# Patient Record
Sex: Male | Born: 1970 | Hispanic: Yes | Marital: Single | Smoking: Never smoker
Health system: Southern US, Community
[De-identification: ages and names within clinical notes are randomized; demographics above are authoritative.]

## PROBLEM LIST (undated history)

## (undated) DIAGNOSIS — E669 Obesity, unspecified: Secondary | ICD-10-CM

## (undated) DIAGNOSIS — K219 Gastro-esophageal reflux disease without esophagitis: Secondary | ICD-10-CM

## (undated) DIAGNOSIS — I10 Essential (primary) hypertension: Secondary | ICD-10-CM

## (undated) DIAGNOSIS — T7840XA Allergy, unspecified, initial encounter: Secondary | ICD-10-CM

## (undated) DIAGNOSIS — F419 Anxiety disorder, unspecified: Secondary | ICD-10-CM

## (undated) DIAGNOSIS — R51 Headache: Secondary | ICD-10-CM

## (undated) DIAGNOSIS — N200 Calculus of kidney: Secondary | ICD-10-CM

## (undated) DIAGNOSIS — Z87442 Personal history of urinary calculi: Secondary | ICD-10-CM

## (undated) DIAGNOSIS — G4730 Sleep apnea, unspecified: Secondary | ICD-10-CM

## (undated) DIAGNOSIS — E119 Type 2 diabetes mellitus without complications: Secondary | ICD-10-CM

## (undated) HISTORY — DX: Obesity, unspecified: E66.9

## (undated) HISTORY — DX: Type 2 diabetes mellitus without complications: E11.9

## (undated) HISTORY — DX: Allergy, unspecified, initial encounter: T78.40XA

## (undated) HISTORY — PX: CHOLECYSTECTOMY: SHX55

## (undated) HISTORY — DX: Sleep apnea, unspecified: G47.30

## (undated) HISTORY — PX: OTHER SURGICAL HISTORY: SHX169

## (undated) HISTORY — DX: Calculus of kidney: N20.0

---

## 1993-09-05 HISTORY — PX: MANDIBLE SURGERY: SHX707

## 2001-10-25 ENCOUNTER — Encounter: Admission: RE | Admit: 2001-10-25 | Discharge: 2001-10-25 | Payer: Self-pay | Admitting: Internal Medicine

## 2001-10-25 ENCOUNTER — Encounter: Payer: Self-pay | Admitting: Internal Medicine

## 2008-09-05 HISTORY — PX: VASECTOMY: SHX75

## 2011-03-18 ENCOUNTER — Other Ambulatory Visit: Payer: Self-pay

## 2011-03-18 ENCOUNTER — Emergency Department (HOSPITAL_BASED_OUTPATIENT_CLINIC_OR_DEPARTMENT_OTHER)
Admission: EM | Admit: 2011-03-18 | Discharge: 2011-03-18 | Disposition: A | Discharge status: Home / Self Care | Payer: BC Managed Care – PPO | Attending: Emergency Medicine | Admitting: Emergency Medicine

## 2011-03-18 ENCOUNTER — Encounter: Payer: Self-pay | Admitting: *Deleted

## 2011-03-18 ENCOUNTER — Emergency Department (INDEPENDENT_AMBULATORY_CARE_PROVIDER_SITE_OTHER): Payer: BC Managed Care – PPO

## 2011-03-18 DIAGNOSIS — I517 Cardiomegaly: Secondary | ICD-10-CM

## 2011-03-18 DIAGNOSIS — R51 Headache: Secondary | ICD-10-CM | POA: Insufficient documentation

## 2011-03-18 DIAGNOSIS — R42 Dizziness and giddiness: Secondary | ICD-10-CM

## 2011-03-18 DIAGNOSIS — R079 Chest pain, unspecified: Secondary | ICD-10-CM | POA: Insufficient documentation

## 2011-03-18 HISTORY — DX: Anxiety disorder, unspecified: F41.9

## 2011-03-18 HISTORY — DX: Headache: R51

## 2011-03-18 LAB — CBC
HCT: 39.3 % (ref 39.0–52.0)
Hemoglobin: 14.1 g/dL (ref 13.0–17.0)
MCHC: 35.9 g/dL (ref 30.0–36.0)
RBC: 5.09 MIL/uL (ref 4.22–5.81)

## 2011-03-18 LAB — BASIC METABOLIC PANEL
BUN: 11 mg/dL (ref 6–23)
CO2: 27 mEq/L (ref 19–32)
Chloride: 101 mEq/L (ref 96–112)
GFR calc non Af Amer: >60 mL/min (ref >60)
Glucose, Bld: 115 mg/dL — ABNORMAL HIGH (ref 70–99)
Potassium: 3.6 mEq/L (ref 3.5–5.1)
Sodium: 138 mEq/L (ref 135–145)

## 2011-03-18 LAB — CARDIAC PANEL(CRET KIN+CKTOT+MB+TROPI)
CK, MB: 2.1 ng/mL (ref 0.3–4.0)
Relative Index: 2.1 (ref 0.0–2.5)
Troponin I: <0.3 ng/mL (ref <0.30)

## 2011-03-18 MED ORDER — IBUPROFEN 400 MG PO TABS
ORAL_TABLET | ORAL | Status: AC
  Administered 2011-03-18: 400 mg via ORAL
  Filled 2011-03-18: qty 1

## 2011-03-18 MED ORDER — IBUPROFEN 400 MG PO TABS
400.0000 mg | ORAL_TABLET | Freq: Once | ORAL | Status: AC
  Administered 2011-03-18: 400 mg via ORAL

## 2011-03-18 NOTE — ED Notes (Signed)
Pt c/o mid sternal CP with dizziness pain radiates to left arm and neck.

## 2011-03-18 NOTE — ED Provider Notes (Signed)
History    chief complaint chest This is a 40 year old male complains of left anterior chest pain onset 2 weeks ago constant improved with an improved with walking worse with sitting supine or no worse with sitting still or lying supine on nonpleuritic no shortness of breath no nausea or sweatiness. Pain is also made worse with abduction of left shoulder improved with putting his left arm at his side symptoms also covered by headache he is treated himself with Excedrin migraine  and with Tylenol with relief. Last dose of Tylenol medially prior to coming here. Cardiac risk factors male gender otherwise negative  Chief Complaint  Patient presents with  . Chest Pain   HPI  Past Medical History  Diagnosis Date  . Anxiety   . Headache     History reviewed. No pertinent past surgical history.  History reviewed. No pertinent family history.  History  Substance Use Topics  . Smoking status: Never Smoker   . Smokeless tobacco: Not on file  . Alcohol Use: No      Review of Systems  Constitutional: Negative.   Respiratory: Negative.   Cardiovascular: Positive for chest pain.  Gastrointestinal: Negative.   Musculoskeletal: Negative.   Skin: Negative.   Neurological: Positive for headaches.  Hematological: Negative.   Psychiatric/Behavioral: Negative.     Physical Exam  BP 126/73  Pulse 80  Temp 98.7 F (37.1 C)  Resp 16  Wt 280 lb (127.007 kg)  SpO2 100%  Physical Exam  Constitutional: He appears well-developed and well-nourished.  HENT:  Head: Normocephalic and atraumatic.  Eyes: Conjunctivae are normal. Pupils are equal, round, and reactive to light.  Neck: Neck supple. No tracheal deviation present. No thyromegaly present.  Cardiovascular: Normal rate and regular rhythm.   No murmur heard. Pulmonary/Chest: Effort normal and breath sounds normal. He exhibits tenderness.       Pain reporduced on forcible abduction odf left shoulder  Abdominal: Soft. Bowel sounds are  normal. He exhibits no distension. There is no tenderness.       obese  Musculoskeletal: Normal range of motion. He exhibits no edema and no tenderness.  Neurological: He is alert. Coordination normal.  Skin: Skin is warm and dry. No rash noted.  Psychiatric: He has a normal mood and affect.    ED Course  Procedures  MDM  Date: 03/18/2011  Rate: 80  Rhythm: normal sinus rhythm  QRS Axis: normal  Intervals: normal  ST/T Wave abnormalities: normal  Conduction Disutrbances:none  Narrative Interpretation: poor r wave progression  Old EKG Reviewed: none available Chest x-ray discussed with radiologist mild cardiomegaly otherwise negative, reviewed by me medical decision medical decision-making: I doubt acute coronary syndrome i.e. highly atypical symptoms(at improved by exertion and worse by rest)., Essentially normal EKG negative cardiac markers 2 days with symptoms plan ibuprofen for pain follow up with primary care doctor at [facility] still symptomatic 1 week  Doug Sou, MD 03/18/11 2324

## 2011-08-01 ENCOUNTER — Other Ambulatory Visit: Payer: Self-pay

## 2011-08-01 ENCOUNTER — Encounter (HOSPITAL_COMMUNITY): Payer: Self-pay

## 2011-08-01 ENCOUNTER — Emergency Department (HOSPITAL_COMMUNITY)
Admission: EM | Admit: 2011-08-01 | Discharge: 2011-08-01 | Discharge status: Against Medical Advice | Payer: BC Managed Care – PPO | Attending: Emergency Medicine | Admitting: Emergency Medicine

## 2011-08-01 DIAGNOSIS — R079 Chest pain, unspecified: Secondary | ICD-10-CM | POA: Insufficient documentation

## 2011-08-01 HISTORY — DX: Essential (primary) hypertension: I10

## 2011-08-01 NOTE — ED Notes (Signed)
VHQ:IONG2<XB> Expected date:08/01/11<BR> Expected time: 3:40 PM<BR> Means of arrival:Ambulance<BR> Comments:<BR> M12 - 40 yoM Shoulder Pain

## 2011-08-01 NOTE — ED Notes (Signed)
CP and squeezing pain......denies SOB

## 2012-06-03 ENCOUNTER — Encounter (HOSPITAL_COMMUNITY): Payer: Self-pay | Admitting: Emergency Medicine

## 2012-06-03 ENCOUNTER — Emergency Department (HOSPITAL_COMMUNITY)
Admission: EM | Admit: 2012-06-03 | Discharge: 2012-06-03 | Disposition: A | Discharge status: Home / Self Care | Payer: BC Managed Care – PPO | Attending: Emergency Medicine | Admitting: Emergency Medicine

## 2012-06-03 ENCOUNTER — Emergency Department (HOSPITAL_COMMUNITY): Payer: BC Managed Care – PPO

## 2012-06-03 DIAGNOSIS — Z881 Allergy status to other antibiotic agents status: Secondary | ICD-10-CM | POA: Insufficient documentation

## 2012-06-03 DIAGNOSIS — F411 Generalized anxiety disorder: Secondary | ICD-10-CM | POA: Insufficient documentation

## 2012-06-03 DIAGNOSIS — R079 Chest pain, unspecified: Secondary | ICD-10-CM | POA: Insufficient documentation

## 2012-06-03 DIAGNOSIS — Z7982 Long term (current) use of aspirin: Secondary | ICD-10-CM | POA: Insufficient documentation

## 2012-06-03 DIAGNOSIS — R1013 Epigastric pain: Secondary | ICD-10-CM | POA: Insufficient documentation

## 2012-06-03 DIAGNOSIS — I10 Essential (primary) hypertension: Secondary | ICD-10-CM | POA: Insufficient documentation

## 2012-06-03 LAB — CBC WITH DIFFERENTIAL/PLATELET
Eosinophils Absolute: 0.2 10*3/uL (ref 0.0–0.7)
Hemoglobin: 13.3 g/dL (ref 13.0–17.0)
Lymphs Abs: 1.9 10*3/uL (ref 0.7–4.0)
MCH: 27.1 pg (ref 26.0–34.0)
Monocytes Relative: 7 % (ref 3–12)
Neutro Abs: 5 10*3/uL (ref 1.7–7.7)
Neutrophils Relative %: 66 % (ref 43–77)
Platelets: 179 10*3/uL (ref 150–400)
RBC: 4.9 MIL/uL (ref 4.22–5.81)
WBC: 7.5 10*3/uL (ref 4.0–10.5)

## 2012-06-03 LAB — COMPREHENSIVE METABOLIC PANEL
ALT: 29 U/L (ref 0–53)
Albumin: 3.6 g/dL (ref 3.5–5.2)
Alkaline Phosphatase: 116 U/L (ref 39–117)
Chloride: 104 mEq/L (ref 96–112)
GFR calc Af Amer: >90 mL/min (ref >90)
Glucose, Bld: 98 mg/dL (ref 70–99)
Potassium: 4 mEq/L (ref 3.5–5.1)
Sodium: 139 mEq/L (ref 135–145)
Total Bilirubin: 0.4 mg/dL (ref 0.3–1.2)
Total Protein: 6.5 g/dL (ref 6.0–8.3)

## 2012-06-03 MED ORDER — GI COCKTAIL ~~LOC~~
30.0000 mL | Freq: Once | ORAL | Status: AC
  Administered 2012-06-03: 30 mL via ORAL
  Filled 2012-06-03: qty 30

## 2012-06-03 MED ORDER — ESOMEPRAZOLE MAGNESIUM 40 MG PO CPDR: 40.0000 mg | DELAYED_RELEASE_CAPSULE | Freq: Every day | ORAL | Status: DC

## 2012-06-03 NOTE — ED Provider Notes (Signed)
History     CSN: [id number]  Arrival date & time 06/03/12  1531   First MD Initiated Contact with Patient 06/03/12 1600      Chief Complaint  Patient presents with  . Chest Pain  . Abdominal Pain    Epigastric Pain    (Consider location/radiation/quality/duration/timing/severity/associated sxs/prior treatment) HPI Comments: Roberto Charles 41 y.o. male   The chief complaint is: Patient presents with:   Chest Pain   Abdominal Pain - Epigastric Pain     Past Medical History:   Anxiety                                                      Headache                                                     Hypertension                                                 Patient presents to ED today with chief complaint of burning epigastric and substernal pain. Patient appears to be mildly neurotic and line of questioning. He does also appear anxious. He states that today. His apartment and no reading he suddenly developed burning epigastric pain which began mildly and then increased it began radiating substernally. He has no history of GERD. No history of smoking, diabetes, hypertension, hypercholesterolemia or family history of early MI or stroke. Patient has single risk factor for cardiac etiology, which is obesity. Patient denies nausea, vomiting, diaphoresis, radiation to left shoulder or jaw. Patient denies exogenous testosterone, recent confinement, history of coagulopathy, history of DVT, leg swelling.  Patient was eating spicy food. Earlier today, but states that this is characteristic for his diet. Patient denies shortness of breath.Denies fevers, chills, myalgias, arthralgias, nausea, vomiting, diarrhea.     Patient is a 41 y.o. male presenting with chest pain and abdominal pain. The history is provided by the patient. No language interpreter was used.  Chest Pain The chest pain began 1 - 2 hours ago. The chest pain is improving. At its most intense, the pain is at 7/10. The pain  is currently at 2/10. The severity of the pain is moderate. The quality of the pain is described as burning. The pain does not radiate. Primary symptoms include abdominal pain. Pertinent negatives for primary symptoms include no fever, no fatigue, no syncope, no shortness of breath, no cough, no wheezing, no palpitations, no nausea, no vomiting, no dizziness and no altered mental status.  The abdominal pain began today. The abdominal pain has been gradually improving since its onset. The abdominal pain is located in the epigastric region. Pain radiation: substernal and along bilateral rib margins. The severity of the abdominal pain is 2/10. The abdominal pain is relieved by nothing.  Pertinent negatives for associated symptoms include no claudication, no diaphoresis, no lower extremity edema, no near-syncope, no numbness, no orthopnea, no paroxysmal nocturnal dyspnea and no weakness. He tried nothing for the symptoms. Risk factors include obesity.  His past medical history  is significant for anxiety/panic attacks.  Pertinent negatives for past medical history include no aneurysm, no aortic aneurysm, no aortic dissection, no arrhythmia, no bicuspid aortic valve, no CAD, no cancer, no congenital heart disease, no connective tissue disease, no COPD, no CHF, no diabetes, no DVT, no hyperhomocysteinemia, no hyperlipidemia, no hypertension, no Kawasaki disease, no Marfan's syndrome, no MI, no mitral valve prolapse, no pacemaker, no PE, no PVD, no recent injury, no rheumatic fever, no seizures, no sickle cell disease, no sleep apnea, no spontaneous pneumothorax, no stimulant use, no strokes, no thyroid problem, no TIA, Turner syndrome and no valve disorder.  Pertinent negatives for family medical history include: family history of aortic dissection, no CAD in family, no connective tissue disease in family, no diabetes in family, no heart disease in family, no hyperlipidemia in family, no hypertension in family, no  Marfan's syndrome in family, no early MI in family, no PE in family, no PVD in family, no sickle cell disease in family, no stroke in family, no sudden death in family and no TIA in family.  Procedure history is positive for exercise treadmill test (previous stress test negative).  Procedure history is negative for cardiac catheterization, echocardiogram, persantine thallium, stress echo and stress thallium.    Abdominal Pain The primary symptoms of the illness include abdominal pain. The primary symptoms of the illness do not include fever, fatigue, shortness of breath, nausea, vomiting, diarrhea or dysuria.  Symptoms associated with the illness do not include diaphoresis, constipation or back pain. Significant associated medical issues do not include diabetes or sickle cell disease.    Past Medical History  Diagnosis Date  . Anxiety   . Headache   . Hypertension     Past Surgical History  Procedure Date  . Vasectomy 2010  . Mandible surgery 1995    History reviewed. No pertinent family history.  History  Substance Use Topics  . Smoking status: Never Smoker   . Smokeless tobacco: Not on file  . Alcohol Use: Yes     4-5 times per month.       Review of Systems  Constitutional: Negative for fever, diaphoresis and fatigue.  Respiratory: Negative for cough, shortness of breath and wheezing.   Cardiovascular: Positive for chest pain. Negative for palpitations, orthopnea, claudication, syncope and near-syncope.  Gastrointestinal: Positive for abdominal pain. Negative for nausea, vomiting, diarrhea and constipation.  Genitourinary: Negative for dysuria.  Musculoskeletal: Negative for myalgias, back pain, arthralgias and gait problem.  Skin: Negative for pallor.  Neurological: Negative for dizziness, seizures, weakness and numbness.  Psychiatric/Behavioral: Negative for altered mental status.    Allergies  Amoxicillin  Home Medications   Current Outpatient Rx  Name Route  Sig Dispense Refill  . ASPIRIN EC 81 MG PO TBEC Oral Take 162 mg by mouth once. For chest pain     . CETIRIZINE HCL 10 MG PO TABS Oral Take 10 mg by mouth daily.      BP 114/61  Pulse 72  Temp 98.1 F (36.7 C) (Oral)  Resp 18  Ht 5\' 6"  (1.676 m)  Wt 260 lb (117.935 kg)  BMI 41.97 kg/m2  SpO2 96%  Physical Exam  Nursing note and vitals reviewed. Constitutional: He is oriented to person, place, and time. He appears well-developed and well-nourished. No distress.  HENT:  Head: Normocephalic and atraumatic.  Eyes: Conjunctivae normal are normal. No scleral icterus.  Neck: Normal range of motion. Neck supple.  Cardiovascular: Normal rate, regular rhythm, normal heart sounds and  intact distal pulses.  Exam reveals no gallop and no friction rub.   No murmur heard. Pulmonary/Chest: Effort normal and breath sounds normal. No respiratory distress. He has no wheezes. He exhibits no tenderness.  Abdominal: Soft. He exhibits no distension. There is no tenderness. There is no guarding.  Musculoskeletal: Normal range of motion. He exhibits no edema.  Neurological: He is alert and oriented to person, place, and time.  Skin: Skin is warm and dry. He is not diaphoretic.  Psychiatric: His behavior is normal.    ED Course  Procedures (including critical care time) Results for orders placed during the hospital encounter of 06/03/12  CBC WITH DIFFERENTIAL      Component Value Range   WBC 7.5  4.0 - 10.5 K/uL   RBC 4.90  4.22 - 5.81 MIL/uL   Hemoglobin 13.3  13.0 - 17.0 g/dL   HCT 45.4 (*) 09.8 - 11.9 %   MCV 79.2  78.0 - 100.0 fL   MCH 27.1  26.0 - 34.0 pg   MCHC 34.3  30.0 - 36.0 g/dL   RDW 14.7  82.9 - 56.2 %   Platelets 179  150 - 400 K/uL   Neutrophils Relative 66  43 - 77 %   Neutro Abs 5.0  1.7 - 7.7 K/uL   Lymphocytes Relative 25  12 - 46 %   Lymphs Abs 1.9  0.7 - 4.0 K/uL   Monocytes Relative 7  3 - 12 %   Monocytes Absolute 0.5  0.1 - 1.0 K/uL   Eosinophils Relative 2  0 - 5 %     Eosinophils Absolute 0.2  0.0 - 0.7 K/uL   Basophils Relative 0  0 - 1 %   Basophils Absolute 0.0  0.0 - 0.1 K/uL  COMPREHENSIVE METABOLIC PANEL      Component Value Range   Sodium 139  135 - 145 mEq/L   Potassium 4.0  3.5 - 5.1 mEq/L   Chloride 104  96 - 112 mEq/L   CO2 27  19 - 32 mEq/L   Glucose, Bld 98  70 - 99 mg/dL   BUN 13  6 - 23 mg/dL   Creatinine, Ser 1.30  0.50 - 1.35 mg/dL   Calcium 8.7  8.4 - 86.5 mg/dL   Total Protein 6.5  6.0 - 8.3 g/dL   Albumin 3.6  3.5 - 5.2 g/dL   AST 22  0 - 37 U/L   ALT 29  0 - 53 U/L   Alkaline Phosphatase 116  39 - 117 U/L   Total Bilirubin 0.4  0.3 - 1.2 mg/dL   GFR calc non Af Amer >90  >90 mL/min   GFR calc Af Amer >90  >90 mL/min  TROPONIN I      Component Value Range   Troponin I <0.30  <0.30 ng/mL  TROPONIN I      Component Value Range   Troponin I <0.30  <0.30 ng/mL   Dg Chest 2 View  06/03/2012  *RADIOLOGY REPORT*  Clinical Data: Mid chest pain.  Abdominal pain.  CHEST - 2 VIEW  Comparison: 03/18/2011  Findings: Midline trachea.  Borderline cardiomegaly, accentuated by a relatively low lung volumes. Numerous leads and wires project over the chest.  No pleural effusion or pneumothorax.  No congestive failure.  Clear lungs.  IMPRESSION: Borderline cardiomegaly and mildly low lung volumes. No acute findings.   Original Report Authenticated By: Consuello Bossier, M.D.     Date: 06/04/2012  Rate:  70  Rhythm: normal sinus rhythm  QRS Axis: normal  Intervals: normal  ST/T Wave abnormalities: normal  Conduction Disutrbances: none  Narrative Interpretation:   Old EKG Reviewed: No significant changes noted     1. Chest pain       MDM  PERC wells negative TIMI 1.6% Previous stress test negative. EKG neg and repeat troponins neg. Labs otherwise unremarkable. I have advised patient of options for further cardiac eval which include CTA of heart but BMI must be under 35.  I have also advised patient that his risk for MI is  extremely low.  Patient sxs resolved after GI cocktail. Discussed return precautions. Discussed reasons to seek immediate care. Patient expresses understanding and agrees with plan.         Arthor Captain, PA-C 06/04/12 1028

## 2012-06-03 NOTE — ED Notes (Signed)
Patient transported to X-ray 

## 2012-06-03 NOTE — ED Notes (Signed)
Pt eating spicy food. Denies Nausea, Vomitting. Pain central chest, burning only.

## 2012-06-03 NOTE — ED Notes (Signed)
MD at bedside. 

## 2012-06-03 NOTE — ED Notes (Signed)
Back from xray uneventful transport pain scale 2/10.

## 2012-06-05 NOTE — ED Provider Notes (Signed)
Medical screening examination/treatment/procedure(s) were performed by non-physician practitioner and as supervising physician I was immediately available for consultation/collaboration.   Gerhard Munch, MD 06/05/12 0006

## 2016-09-14 DIAGNOSIS — E6609 Other obesity due to excess calories: Secondary | ICD-10-CM | POA: Insufficient documentation

## 2016-09-14 DIAGNOSIS — R0683 Snoring: Secondary | ICD-10-CM | POA: Insufficient documentation

## 2016-12-25 DIAGNOSIS — J302 Other seasonal allergic rhinitis: Secondary | ICD-10-CM | POA: Insufficient documentation

## 2016-12-25 DIAGNOSIS — E782 Mixed hyperlipidemia: Secondary | ICD-10-CM | POA: Insufficient documentation

## 2020-04-30 ENCOUNTER — Ambulatory Visit (INDEPENDENT_AMBULATORY_CARE_PROVIDER_SITE_OTHER): Payer: 59 | Admitting: Family Medicine

## 2020-04-30 ENCOUNTER — Encounter: Payer: Self-pay | Admitting: Family Medicine

## 2020-04-30 ENCOUNTER — Other Ambulatory Visit: Payer: Self-pay

## 2020-04-30 VITALS — BP 122/73 | HR 67 | Temp 98.1°F | Ht 66.0 in | Wt 305.1 lb

## 2020-04-30 DIAGNOSIS — Z0000 Encounter for general adult medical examination without abnormal findings: Secondary | ICD-10-CM

## 2020-04-30 DIAGNOSIS — E782 Mixed hyperlipidemia: Secondary | ICD-10-CM | POA: Diagnosis not present

## 2020-04-30 NOTE — Assessment & Plan Note (Signed)
Well adult Orders Placed This Encounter  Procedures  . COMPLETE METABOLIC PANEL WITH GFR  . CBC  . Lipid Profile  . TSH  Screening: Lipid panel Immunizations: UTD Anticipatory guidance/Risk factor reduction:  Recommend healthy, reduced calorie diet and regular exercise for weight management.  He plans to restart martial arts soon.

## 2020-04-30 NOTE — Progress Notes (Signed)
Roberto Charles - 49 y.o. male MRN [id number]  Date of birth: [birth year]  Subjective Chief Complaint  Patient presents with  . Establish Care    HPI Roberto Charles is a 49 y.o. male here today for initial visit.  He has a history of HLD.  He has no concerns today and would like to have annual exam.    He has put on weight over the past few years.  He was active doing martial arts previously but had to stop due to pandemic.  His new job is more sedentary as well.  His diet has not really changed.    He is a non-smoker and has a couple of servings of EtOH each month  He did have COVID vaccine.   Review of Systems  Constitutional: Negative for chills, fever, malaise/fatigue and weight loss.  HENT: Negative for congestion, ear pain and sore throat.   Eyes: Negative for blurred vision, double vision and pain.  Respiratory: Negative for cough and shortness of breath.   Cardiovascular: Negative for chest pain and palpitations.  Gastrointestinal: Negative for abdominal pain, blood in stool, constipation, heartburn and nausea.  Genitourinary: Negative for dysuria and urgency.  Musculoskeletal: Negative for joint pain and myalgias.  Neurological: Negative for dizziness and headaches.  Endo/Heme/Allergies: Does not bruise/bleed easily.  Psychiatric/Behavioral: Negative for depression. The patient is not nervous/anxious and does not have insomnia.     Allergies  Allergen Reactions  . Amoxicillin Other (See Comments)    Causes blood in intestines    Past Medical History:  Diagnosis Date  . Anxiety   . Headache(784.0)   . Hypertension     Past Surgical History:  Procedure Laterality Date  . MANDIBLE SURGERY  1995  . VASECTOMY  2010    Social History   Socioeconomic History  . Marital status: Married    Spouse name: Not on file  . Number of children: Not on file  . Years of education: Not on file  . Highest education level: Not on file  Occupational History  . Occupation:  Insurance underwriter  Tobacco Use  . Smoking status: Never Smoker  . Smokeless tobacco: Never Used  Vaping Use  . Vaping Use: Never used  Substance and Sexual Activity  . Alcohol use: Yes    Alcohol/week: 2.0 standard drinks    Types: 2 Standard drinks or equivalent per week    Comment: 2 X PER MONTH  . Drug use: No  . Sexual activity: Yes    Partners: Female    Birth control/protection: Other-see comments    Comment: Vasectomy  Other Topics Concern  . Not on file  Social History Narrative  . Not on file   Social Determinants of Health   Financial Resource Strain:   . Difficulty of Paying Living Expenses: Not on file  Food Insecurity:   . Worried About Charity fundraiser in the Last Year: Not on file  . Ran Out of Food in the Last Year: Not on file  Transportation Needs:   . Lack of Transportation (Medical): Not on file  . Lack of Transportation (Non-Medical): Not on file  Physical Activity:   . Days of Exercise per Week: Not on file  . Minutes of Exercise per Session: Not on file  Stress:   . Feeling of Stress : Not on file  Social Connections:   . Frequency of Communication with Friends and Family: Not on file  . Frequency of Social Gatherings with Friends and Family: Not on  file  . Attends Religious Services: Not on file  . Active Member of Clubs or Organizations: Not on file  . Attends Archivist Meetings: Not on file  . Marital Status: Not on file    Family History  Problem Relation Age of Onset  . Hypertension Father     Health Maintenance  Topic Date Due  . Hepatitis C Screening  Never done  . COVID-19 Vaccine (1) Never done  . HIV Screening  Never done  . INFLUENZA VACCINE  04/05/2020  . TETANUS/TDAP  01/15/2028     ----------------------------------------------------------------------------------------------------------------------------------------------------------------------------------------------------------------- Physical Exam BP 122/73  (BP Location: Left Arm, Patient Position: Sitting, Cuff Size: Large)   Pulse 67   Temp 98.1 F (36.7 C) (Oral)   Ht 5\' 6"  (1.676 m)   Wt (!) 305 lb 1.9 oz (138.4 kg)   SpO2 96%   BMI 49.25 kg/m   Physical Exam Constitutional:      General: He is not in acute distress. HENT:     Head: Normocephalic and atraumatic.     Right Ear: External ear normal.     Left Ear: External ear normal.  Eyes:     General: No scleral icterus. Neck:     Thyroid: No thyromegaly.  Cardiovascular:     Rate and Rhythm: Normal rate and regular rhythm.     Heart sounds: Normal heart sounds.  Pulmonary:     Effort: Pulmonary effort is normal.     Breath sounds: Normal breath sounds.  Abdominal:     General: Bowel sounds are normal. There is no distension.     Palpations: Abdomen is soft.     Tenderness: There is no abdominal tenderness. There is no guarding.  Musculoskeletal:     Cervical back: Normal range of motion.  Lymphadenopathy:     Cervical: No cervical adenopathy.  Skin:    General: Skin is warm and dry.     Findings: No rash.  Neurological:     Mental Status: He is alert and oriented to person, place, and time.     Cranial Nerves: No cranial nerve deficit.     Motor: No abnormal muscle tone.  Psychiatric:        Behavior: Behavior normal.     ------------------------------------------------------------------------------------------------------------------------------------------------------------------------------------------------------------------- Assessment and Plan  Well adult exam Well adult Orders Placed This Encounter  Procedures  . COMPLETE METABOLIC PANEL WITH GFR  . CBC  . Lipid Profile  . TSH  Screening: Lipid panel Immunizations: UTD Anticipatory guidance/Risk factor reduction:  Recommend healthy, reduced calorie diet and regular exercise for weight management.  He plans to restart martial arts soon.     No orders of the defined types were placed in this  encounter.   No follow-ups on file.    This visit occurred during the SARS-CoV-2 public health emergency.  Safety protocols were in place, including screening questions prior to the visit, additional usage of staff PPE, and extensive cleaning of exam room while observing appropriate contact time as indicated for disinfecting solutions.

## 2020-04-30 NOTE — Patient Instructions (Signed)
Preventive Care 41-49 Years Old, Male Preventive care refers to lifestyle choices and visits with your health care provider that can promote health and wellness. This includes:  A yearly physical exam. This is also called an annual well check.  Regular dental and eye exams.  Immunizations.  Screening for certain conditions.  Healthy lifestyle choices, such as eating a healthy diet, getting regular exercise, not using drugs or products that contain nicotine and tobacco, and limiting alcohol use. What can I expect for my preventive care visit? Physical exam Your health care provider will check:  Height and weight. These may be used to calculate body mass index (BMI), which is a measurement that tells if you are at a healthy weight.  Heart rate and blood pressure.  Your skin for abnormal spots. Counseling Your health care provider may ask you questions about:  Alcohol, tobacco, and drug use.  Emotional well-being.  Home and relationship well-being.  Sexual activity.  Eating habits.  Work and work Statistician. What immunizations do I need?  Influenza (flu) vaccine  This is recommended every year. Tetanus, diphtheria, and pertussis (Tdap) vaccine  You may need a Td booster every 10 years. Varicella (chickenpox) vaccine  You may need this vaccine if you have not already been vaccinated. Zoster (shingles) vaccine  You may need this after age 64. Measles, mumps, and rubella (MMR) vaccine  You may need at least one dose of MMR if you were born in 1957 or later. You may also need a second dose. Pneumococcal conjugate (PCV13) vaccine  You may need this if you have certain conditions and were not previously vaccinated. Pneumococcal polysaccharide (PPSV23) vaccine  You may need one or two doses if you smoke cigarettes or if you have certain conditions. Meningococcal conjugate (MenACWY) vaccine  You may need this if you have certain conditions. Hepatitis A  vaccine  You may need this if you have certain conditions or if you travel or work in places where you may be exposed to hepatitis A. Hepatitis B vaccine  You may need this if you have certain conditions or if you travel or work in places where you may be exposed to hepatitis B. Haemophilus influenzae type b (Hib) vaccine  You may need this if you have certain risk factors. Human papillomavirus (HPV) vaccine  If recommended by your health care provider, you may need three doses over 6 months. You may receive vaccines as individual doses or as more than one vaccine together in one shot (combination vaccines). Talk with your health care provider about the risks and benefits of combination vaccines. What tests do I need? Blood tests  Lipid and cholesterol levels. These may be checked every 5 years, or more frequently if you are over 60 years old.  Hepatitis C test.  Hepatitis B test. Screening  Lung cancer screening. You may have this screening every year starting at age 43 if you have a 30-pack-year history of smoking and currently smoke or have quit within the past 15 years.  Prostate cancer screening. Recommendations will vary depending on your family history and other risks.  Colorectal cancer screening. All adults should have this screening starting at age 72 and continuing until age 2. Your health care provider may recommend screening at age 14 if you are at increased risk. You will have tests every 1-10 years, depending on your results and the type of screening test.  Diabetes screening. This is done by checking your blood sugar (glucose) after you have not eaten  for a while (fasting). You may have this done every 1-3 years.  Sexually transmitted disease (STD) testing. Follow these instructions at home: Eating and drinking  Eat a diet that includes fresh fruits and vegetables, whole grains, lean protein, and low-fat dairy products.  Take vitamin and mineral supplements as  recommended by your health care provider.  Do not drink alcohol if your health care provider tells you not to drink.  If you drink alcohol: ? Limit how much you have to 0-2 drinks a day. ? Be aware of how much alcohol is in your drink. In the [place] one drink equals one 12 oz bottle of beer (355 mL), one 5 oz glass of wine (148 mL), or one 1 oz glass of hard liquor (44 mL). Lifestyle  Take daily care of your teeth and gums.  Stay active. Exercise for at least 30 minutes on 5 or more days each week.  Do not use any products that contain nicotine or tobacco, such as cigarettes, e-cigarettes, and chewing tobacco. If you need help quitting, ask your health care provider.  If you are sexually active, practice safe sex. Use a condom or other form of protection to prevent STIs (sexually transmitted infections).  Talk with your health care provider about taking a low-dose aspirin every day starting at age 53. What's next?  Go to your health care provider once a year for a well check visit.  Ask your health care provider how often you should have your eyes and teeth checked.  Stay up to date on all vaccines. This information is not intended to replace advice given to you by your health care provider. Make sure you discuss any questions you have with your health care provider. Document Revised: 08/16/2018 Document Reviewed: 08/16/2018 Elsevier Patient Education  2020 [employer].

## 2020-05-02 LAB — LIPID PANEL
Cholesterol: 183 mg/dL (ref <200)
HDL: 39 mg/dL — ABNORMAL LOW (ref >=40)
LDL Cholesterol (Calc): 116 mg/dL (calc) — ABNORMAL HIGH
Non-HDL Cholesterol (Calc): 144 mg/dL (calc) — ABNORMAL HIGH (ref <130)
Total CHOL/HDL Ratio: 4.7 (calc) (ref <5.0)
Triglycerides: 162 mg/dL — ABNORMAL HIGH (ref <150)

## 2020-05-02 LAB — CBC
HCT: 43.6 % (ref 38.5–50.0)
Hemoglobin: 14.4 g/dL (ref 13.2–17.1)
MCH: 27.1 pg (ref 27.0–33.0)
MCHC: 33 g/dL (ref 32.0–36.0)
MCV: 82.1 fL (ref 80.0–100.0)
MPV: 10.4 fL (ref 7.5–12.5)
Platelets: 187 10*3/uL (ref 140–400)
RBC: 5.31 10*6/uL (ref 4.20–5.80)
RDW: 14.5 % (ref 11.0–15.0)
WBC: 7.5 10*3/uL (ref 3.8–10.8)

## 2020-05-02 LAB — COMPLETE METABOLIC PANEL WITH GFR
AG Ratio: 1.6 (calc) (ref 1.0–2.5)
ALT: 35 U/L (ref 9–46)
AST: 19 U/L (ref 10–40)
Albumin: 4.1 g/dL (ref 3.6–5.1)
Alkaline phosphatase (APISO): 105 U/L (ref 36–130)
BUN: 11 mg/dL (ref 7–25)
CO2: 25 mmol/L (ref 20–32)
Calcium: 8.4 mg/dL — ABNORMAL LOW (ref 8.6–10.3)
Chloride: 105 mmol/L (ref 98–110)
Creat: 0.71 mg/dL (ref 0.60–1.35)
GFR, Est African American: 129 mL/min/{1.73_m2} (ref >=60)
GFR, Est Non African American: 111 mL/min/{1.73_m2} (ref >=60)
Globulin: 2.5 g/dL (calc) (ref 1.9–3.7)
Glucose, Bld: 115 mg/dL — ABNORMAL HIGH (ref 65–99)
Potassium: 4 mmol/L (ref 3.5–5.3)
Sodium: 137 mmol/L (ref 135–146)
Total Bilirubin: 0.7 mg/dL (ref 0.2–1.2)
Total Protein: 6.6 g/dL (ref 6.1–8.1)

## 2020-05-02 LAB — HEMOGLOBIN A1C W/OUT EAG: Hgb A1c MFr Bld: 6.6 % of total Hgb — ABNORMAL HIGH (ref <5.7)

## 2020-05-02 LAB — TSH: TSH: 1.89 mIU/L (ref 0.40–4.50)

## 2020-05-06 ENCOUNTER — Other Ambulatory Visit: Payer: Self-pay

## 2020-05-06 MED ORDER — METFORMIN HCL 500 MG PO TABS: 500.0000 mg | ORAL_TABLET | Freq: Every day | ORAL | 0 refills | Status: DC

## 2020-05-14 ENCOUNTER — Encounter: Payer: Self-pay | Admitting: Family Medicine

## 2020-06-04 ENCOUNTER — Emergency Department (HOSPITAL_BASED_OUTPATIENT_CLINIC_OR_DEPARTMENT_OTHER): Payer: 59

## 2020-06-04 ENCOUNTER — Other Ambulatory Visit: Payer: Self-pay

## 2020-06-04 ENCOUNTER — Emergency Department (HOSPITAL_BASED_OUTPATIENT_CLINIC_OR_DEPARTMENT_OTHER)
Admission: EM | Admit: 2020-06-04 | Discharge: 2020-06-04 | Disposition: A | Discharge status: Home / Self Care | Payer: 59 | Attending: Emergency Medicine | Admitting: Emergency Medicine

## 2020-06-04 ENCOUNTER — Encounter (HOSPITAL_BASED_OUTPATIENT_CLINIC_OR_DEPARTMENT_OTHER): Payer: Self-pay | Admitting: Emergency Medicine

## 2020-06-04 DIAGNOSIS — M542 Cervicalgia: Secondary | ICD-10-CM | POA: Diagnosis not present

## 2020-06-04 DIAGNOSIS — R0789 Other chest pain: Secondary | ICD-10-CM | POA: Insufficient documentation

## 2020-06-04 DIAGNOSIS — R519 Headache, unspecified: Secondary | ICD-10-CM | POA: Diagnosis present

## 2020-06-04 DIAGNOSIS — I10 Essential (primary) hypertension: Secondary | ICD-10-CM | POA: Insufficient documentation

## 2020-06-04 DIAGNOSIS — E119 Type 2 diabetes mellitus without complications: Secondary | ICD-10-CM | POA: Diagnosis not present

## 2020-06-04 DIAGNOSIS — M25512 Pain in left shoulder: Secondary | ICD-10-CM | POA: Insufficient documentation

## 2020-06-04 LAB — CBC
HCT: 44.1 % (ref 39.0–52.0)
Hemoglobin: 14.4 g/dL (ref 13.0–17.0)
MCH: 26.8 pg (ref 26.0–34.0)
MCHC: 32.7 g/dL (ref 30.0–36.0)
MCV: 82.1 fL (ref 80.0–100.0)
Platelets: 189 10*3/uL (ref 150–400)
RBC: 5.37 MIL/uL (ref 4.22–5.81)
RDW: 14.5 % (ref 11.5–15.5)
WBC: 8 10*3/uL (ref 4.0–10.5)
nRBC: 0 % (ref 0.0–0.2)

## 2020-06-04 LAB — BASIC METABOLIC PANEL
Anion gap: 10 (ref 5–15)
BUN: 14 mg/dL (ref 6–20)
CO2: 25 mmol/L (ref 22–32)
Calcium: 8.6 mg/dL — ABNORMAL LOW (ref 8.9–10.3)
Chloride: 103 mmol/L (ref 98–111)
Creatinine, Ser: 0.69 mg/dL (ref 0.61–1.24)
GFR calc Af Amer: >60 mL/min (ref >60)
GFR calc non Af Amer: >60 mL/min (ref >60)
Glucose, Bld: 117 mg/dL — ABNORMAL HIGH (ref 70–99)
Potassium: 4 mmol/L (ref 3.5–5.1)
Sodium: 138 mmol/L (ref 135–145)

## 2020-06-04 LAB — TROPONIN I (HIGH SENSITIVITY)
Troponin I (High Sensitivity): 3 ng/L (ref <18)
Troponin I (High Sensitivity): 3 ng/L (ref <18)

## 2020-06-04 LAB — PROTIME-INR
INR: 1.1 (ref 0.8–1.2)
Prothrombin Time: 13.3 seconds (ref 11.4–15.2)

## 2020-06-04 LAB — APTT: aPTT: 34 seconds (ref 24–36)

## 2020-06-04 MED ORDER — DIPHENHYDRAMINE HCL 50 MG/ML IJ SOLN
25.0000 mg | Freq: Once | INTRAMUSCULAR | Status: AC
  Administered 2020-06-04: 25 mg via INTRAVENOUS
  Filled 2020-06-04: qty 1

## 2020-06-04 MED ORDER — PROCHLORPERAZINE EDISYLATE 10 MG/2ML IJ SOLN
10.0000 mg | Freq: Once | INTRAMUSCULAR | Status: AC
  Administered 2020-06-04: 10 mg via INTRAVENOUS
  Filled 2020-06-04: qty 2

## 2020-06-04 MED ORDER — IOHEXOL 350 MG/ML SOLN
100.0000 mL | Freq: Once | INTRAVENOUS | Status: AC | PRN
  Administered 2020-06-04: 100 mL via INTRAVENOUS

## 2020-06-04 MED ORDER — SODIUM CHLORIDE 0.9 % IV BOLUS
1000.0000 mL | Freq: Once | INTRAVENOUS | Status: AC
  Administered 2020-06-04: 1000 mL via INTRAVENOUS

## 2020-06-04 NOTE — Discharge Instructions (Addendum)
You were evaluated in the Emergency Department and after careful evaluation, we did not find any emergent condition requiring admission or further testing in the hospital.  Your exam/testing today was overall reassuring.  No evidence of stroke or abnormalities of the brain.  Given your symptoms, we recommend follow-up with a neurologist.  Please return to the Emergency Department if you experience any worsening of your condition.  Thank you for allowing [place] to be a part of your care.

## 2020-06-04 NOTE — ED Provider Notes (Signed)
[facility] Emergency Department Provider Note MRN:  [id number]  Arrival date & time: 06/04/20     Chief Complaint   Headache   History of Present Illness   Roberto Charles is a 49 y.o. year-old male with a history of hypertension, diabetes presenting to the ED with chief complaint of headache.  Sudden onset swooshing sound in both ears but more so on the right followed by moderate to severe headache, left-sided neck and shoulder pain.  Also endorsing some chest discomfort.  Denies shortness of breath, no fever, no cough, no abdominal pain, no numbness or weakness to the arms or legs but for a few moments felt like he was frozen and unable to move his arms.  Review of Systems  A complete 10 system review of systems was obtained and all systems are negative except as noted in the HPI and PMH.   Patient's Health History    Past Medical History:  Diagnosis Date  . Anxiety   . Diabetes mellitus without complication ([place])   . Headache(784.0)   . Hypertension     Past Surgical History:  Procedure Laterality Date  . CHOLECYSTECTOMY    . MANDIBLE SURGERY  1995  . VASECTOMY  2010    Family History  Problem Relation Age of Onset  . Hypertension Father     Social History   Socioeconomic History  . Marital status: Married    Spouse name: Not on file  . Number of children: Not on file  . Years of education: Not on file  . Highest education level: Not on file  Occupational History  . Occupation: Insurance underwriter  Tobacco Use  . Smoking status: Never Smoker  . Smokeless tobacco: Never Used  Vaping Use  . Vaping Use: Never used  Substance and Sexual Activity  . Alcohol use: Yes    Alcohol/week: 2.0 standard drinks    Types: 2 Standard drinks or equivalent per week    Comment: 2 X PER MONTH  . Drug use: No  . Sexual activity: Yes    Partners: Female    Birth control/protection: Other-see comments    Comment: Vasectomy  Other Topics Concern  . Not on  file  Social History Narrative  . Not on file   Social Determinants of Health   Financial Resource Strain:   . Difficulty of Paying Living Expenses: Not on file  Food Insecurity:   . Worried About Charity fundraiser in the Last Year: Not on file  . Ran Out of Food in the Last Year: Not on file  Transportation Needs:   . Lack of Transportation (Medical): Not on file  . Lack of Transportation (Non-Medical): Not on file  Physical Activity:   . Days of Exercise per Week: Not on file  . Minutes of Exercise per Session: Not on file  Stress:   . Feeling of Stress : Not on file  Social Connections:   . Frequency of Communication with Friends and Family: Not on file  . Frequency of Social Gatherings with Friends and Family: Not on file  . Attends Religious Services: Not on file  . Active Member of Clubs or Organizations: Not on file  . Attends Archivist Meetings: Not on file  . Marital Status: Not on file  Intimate Partner Violence:   . Fear of Current or Ex-Partner: Not on file  . Emotionally Abused: Not on file  . Physically Abused: Not on file  . Sexually Abused: Not on  file     Physical Exam   Vitals:   06/04/20 0744 06/04/20 1014  BP: 108/71 (!) 109/58  Pulse: 62 (!) 56  Resp: 18 18  Temp: 98.1 F (36.7 C)   SpO2: 97% 96%    CONSTITUTIONAL: Well-appearing, NAD NEURO:  Alert and oriented x 3, normal and symmetric strength and sensation, normal coordination, normal speech, patient endorsing hypersensitivity to the left side of the face EYES:  eyes equal and reactive ENT/NECK:  no LAD, no JVD CARDIO: Regular rate, well-perfused, normal S1 and S2 PULM:  CTAB no wheezing or rhonchi GI/GU:  normal bowel sounds, non-distended, non-tender MSK/SPINE:  No gross deformities, no edema SKIN:  no rash, atraumatic PSYCH:  Appropriate speech and behavior  *Additional and/or pertinent findings included in MDM below  Diagnostic and Interventional Summary    EKG  Interpretation  Date/Time:  Thursday June 04 2020 08:22:18 EDT Ventricular Rate:  61 PR Interval:    QRS Duration: 98 QT Interval:  388 QTC Calculation: 391 R Axis:   52 Text Interpretation: Sinus rhythm Low voltage, precordial leads Confirmed by Gerlene Fee [phone]) on 06/04/2020 8:52:18 AM      Labs Reviewed  BASIC METABOLIC PANEL - Abnormal; Notable for the following components:      Result Value   Glucose, Bld 117 (*)    Calcium 8.6 (*)    All other components within normal limits  CBC  PROTIME-INR  APTT  TROPONIN I (HIGH SENSITIVITY)  TROPONIN I (HIGH SENSITIVITY)    CT Angio Neck W and/or Wo Contrast  Final Result    CT Angio Head W or Wo Contrast  Final Result    DG Chest Port 1 View  Final Result      Medications  sodium chloride 0.9 % bolus 1,000 mL (0 mLs Intravenous Stopped 06/04/20 0958)  diphenhydrAMINE (BENADRYL) injection 25 mg (25 mg Intravenous Given 06/04/20 0859)  prochlorperazine (COMPAZINE) injection 10 mg (10 mg Intravenous Given 06/04/20 0901)  iohexol (OMNIPAQUE) 350 MG/ML injection 100 mL (100 mLs Intravenous Contrast Given 06/04/20 0916)     Procedures  /  Critical Care Procedures  ED Course and Medical Decision Making  I have reviewed the triage vital signs, the nursing notes, and pertinent available records from the EMR.  Listed above are laboratory and imaging tests that I personally ordered, reviewed, and interpreted and then considered in my medical decision making (see below for details).  Considering vascular pathology given the pulsatile swishing sound in the ears, followed by sudden onset headache will need CTA to exclude subarachnoid hemorrhage.  Also considering complex migraine given the hypersensitivity to the left side of the face, patient's NIH stroke scale is currently 0, awaiting CTA.  Will also obtain EKG and troponin and chest x-ray given the chest pain the patient is endorsing.     Work-up is reassuring, CTA without  signs of aneurysm or bleeding, no evidence of stroke, labs are reassuring with negative troponin x2.  Patient feeling much better after migraine cocktail.  Continued normal neurological exam.  Upon closer questioning after the headache patient explains that felt spasms in bilateral arms but denied anything that would suggest TIA, no lateral numbness or weakness, no speech impairment, no visual changes.  Favoring complex migraine, no indication for admission or further testing, appropriate for discharge.  Barth Kirks. [place] Small, [place] [email or web address]  Final Clinical Impressions(s) / ED Diagnoses     ICD-10-CM   1. Acute nonintractable  headache, unspecified headache type  R51.9     ED Discharge Orders    None       Discharge Instructions Discussed with and Provided to Patient:     Discharge Instructions     You were evaluated in the Emergency Department and after careful evaluation, we did not find any emergent condition requiring admission or further testing in the hospital.  Your exam/testing today was overall reassuring.  No evidence of stroke or abnormalities of the brain.  Given your symptoms, we recommend follow-up with a neurologist.  Please return to the Emergency Department if you experience any worsening of your condition.  Thank you for allowing [place] to be a part of your care.        Maudie Flakes, MD 06/04/20 [phone]

## 2020-06-04 NOTE — ED Triage Notes (Signed)
Pt states that at 6:50 he experienced a "pulsating swhooshing sound" and felt a headache, L shoulder pain and couldn't move. He is unsure how long it lasted. Still c/o shoulder and head pain.

## 2020-06-04 NOTE — ED Notes (Signed)
ED Provider at bedside. 

## 2020-06-09 ENCOUNTER — Encounter: Payer: Self-pay | Admitting: Family Medicine

## 2020-06-09 ENCOUNTER — Ambulatory Visit (INDEPENDENT_AMBULATORY_CARE_PROVIDER_SITE_OTHER): Payer: 59 | Admitting: Family Medicine

## 2020-06-09 VITALS — BP 132/70 | HR 61 | Temp 98.2°F | Wt 304.0 lb

## 2020-06-09 DIAGNOSIS — R310 Gross hematuria: Secondary | ICD-10-CM | POA: Diagnosis not present

## 2020-06-09 DIAGNOSIS — R319 Hematuria, unspecified: Secondary | ICD-10-CM | POA: Insufficient documentation

## 2020-06-09 LAB — POCT URINALYSIS DIP (CLINITEK)
Bilirubin, UA: NEGATIVE
Blood, UA: NEGATIVE
Glucose, UA: NEGATIVE mg/dL
Ketones, POC UA: NEGATIVE mg/dL
Nitrite, UA: NEGATIVE
POC PROTEIN,UA: NEGATIVE
Spec Grav, UA: 1.025 (ref 1.010–1.025)
Urobilinogen, UA: 0.2 E.U./dL
pH, UA: 6 (ref 5.0–8.0)

## 2020-06-09 MED ORDER — PHENAZOPYRIDINE HCL 200 MG PO TABS: 200.0000 mg | ORAL_TABLET | Freq: Three times a day (TID) | ORAL | 0 refills | Status: DC | PRN

## 2020-06-09 NOTE — Progress Notes (Signed)
Kent Riendeau - 49 y.o. male MRN [id number]  Date of birth: Sep 04, 1971  Subjective Chief Complaint  Patient presents with  . Hematuria    HPI Asahd Can is a 49 y.o. male here today with complaint of hematuria.  Reports that he was using the restroom yesterday and flow decreased.  He had to force urine out but when he did he noticed blood in his urine.  Blood in his urine has nearly resolved however he has had some mild burning when urinating. He denies flank pain, fever, chills, nausea.  ROS:  A comprehensive ROS was completed and negative except as noted per HPI  Allergies  Allergen Reactions  . Amoxicillin Other (See Comments)    Causes blood in intestines    Past Medical History:  Diagnosis Date  . Anxiety   . Diabetes mellitus without complication ([place])   . Headache(784.0)   . Hypertension     Past Surgical History:  Procedure Laterality Date  . CHOLECYSTECTOMY    . MANDIBLE SURGERY  1995  . VASECTOMY  2010    Social History   Socioeconomic History  . Marital status: Married    Spouse name: Not on file  . Number of children: Not on file  . Years of education: Not on file  . Highest education level: Not on file  Occupational History  . Occupation: Insurance underwriter  Tobacco Use  . Smoking status: Never Smoker  . Smokeless tobacco: Never Used  Vaping Use  . Vaping Use: Never used  Substance and Sexual Activity  . Alcohol use: Yes    Alcohol/week: 2.0 standard drinks    Types: 2 Standard drinks or equivalent per week    Comment: 2 X PER MONTH  . Drug use: No  . Sexual activity: Yes    Partners: Female    Birth control/protection: Other-see comments    Comment: Vasectomy  Other Topics Concern  . Not on file  Social History Narrative  . Not on file   Social Determinants of Health   Financial Resource Strain:   . Difficulty of Paying Living Expenses: Not on file  Food Insecurity:   . Worried About Charity fundraiser in the Last Year: Not on file  .  Ran Out of Food in the Last Year: Not on file  Transportation Needs:   . Lack of Transportation (Medical): Not on file  . Lack of Transportation (Non-Medical): Not on file  Physical Activity:   . Days of Exercise per Week: Not on file  . Minutes of Exercise per Session: Not on file  Stress:   . Feeling of Stress : Not on file  Social Connections:   . Frequency of Communication with Friends and Family: Not on file  . Frequency of Social Gatherings with Friends and Family: Not on file  . Attends Religious Services: Not on file  . Active Member of Clubs or Organizations: Not on file  . Attends Archivist Meetings: Not on file  . Marital Status: Not on file    Family History  Problem Relation Age of Onset  . Hypertension Father     Health Maintenance  Topic Date Due  . Hepatitis C Screening  Never done  . INFLUENZA VACCINE  04/05/2020  . TETANUS/TDAP  01/15/2028  . COVID-19 Vaccine  Completed  . HIV Screening  Completed     ----------------------------------------------------------------------------------------------------------------------------------------------------------------------------------------------------------------- Physical Exam BP 132/70 (BP Location: Left Arm, Patient Position: Sitting, Cuff Size: Large)   Pulse 61   Temp  98.2 F (36.8 C) (Oral)   Wt (!) 304 lb (137.9 kg)   SpO2 94%   BMI 49.07 kg/m   Physical Exam Constitutional:      Appearance: Normal appearance.  HENT:     Head: Normocephalic and atraumatic.  Eyes:     General: No scleral icterus. Cardiovascular:     Rate and Rhythm: Normal rate and regular rhythm.  Pulmonary:     Effort: Pulmonary effort is normal.     Breath sounds: Normal breath sounds.  Abdominal:     Tenderness: There is no right CVA tenderness or left CVA tenderness.  Musculoskeletal:     Cervical back: Neck supple.  Neurological:     General: No focal deficit present.     Mental Status: He is alert.   Psychiatric:        Mood and Affect: Mood normal.        Behavior: Behavior normal.     ------------------------------------------------------------------------------------------------------------------------------------------------------------------------------------------------------------------- Assessment and Plan  Hematuria This has nearly resolved at this point. Repeat UA from today without blood. He likely passed small stone.   Will send for culture as he is still having dysuria.  Adding pyridium as needed for comfort.     Meds ordered this encounter  Medications  . phenazopyridine (PYRIDIUM) 200 MG tablet    Sig: Take 1 tablet (200 mg total) by mouth 3 (three) times daily as needed for pain.    Dispense:  10 tablet    Refill:  0   Orders Placed This Encounter  Procedures  . Urine Culture  . POCT URINALYSIS DIP (CLINITEK)    No follow-ups on file.    This visit occurred during the SARS-CoV-2 public health emergency.  Safety protocols were in place, including screening questions prior to the visit, additional usage of staff PPE, and extensive cleaning of exam room while observing appropriate contact time as indicated for disinfecting solutions.

## 2020-06-09 NOTE — Assessment & Plan Note (Signed)
This has nearly resolved at this point. Repeat UA from today without blood. He likely passed small stone.   Will send for culture as he is still having dysuria.  Adding pyridium as needed for comfort.

## 2020-06-09 NOTE — Patient Instructions (Signed)
Increase your fluid intake.  Try pyridium as needed.    Diet Recommendations for Kidney Stones  General Recommendations Drink plenty of fluid: 2-3 quarts/day This includes any type of fluid such as water, coffee and lemonade which have been shown to have a beneficial effect with the exception of grapefruit juice and soda. This will help produce less concentrated urine and ensure a good urine volume of at least 2.5L/day  Limit foods with high oxalate content Spinach, many berries, chocolate, wheat bran, nuts, beets, tea and rhubarb should be eliminated from your diet intake  Eat enough dietary calcium Three servings of dairy per day will help lower the risk of calcium stone formation. Eat with meals. Avoid extra calcium supplements Calcium supplements should be individualized by your physician and registered kidney dietitian  Eat a moderate amount of protein High protein intakes will cause the kidneys to excrete more calcium therefore this may cause more stones to form in the kidney  Avoid high salt intake High sodium intake increases calcium in the urine which increases the chances of developing stones Low salt diet is also important to control blood pressure.  Avoid high doses of vitamin C supplements It is recommend to take 60mg /day of vitamin C based on the [place] Dietary Reference Intake Excess amounts of 1000mg /day or more may produce more oxalate in the body

## 2020-06-11 ENCOUNTER — Encounter: Payer: Self-pay | Admitting: Family Medicine

## 2020-06-11 LAB — URINE CULTURE
MICRO NUMBER:: 11033953
Result:: NO GROWTH
SPECIMEN QUALITY:: ADEQUATE

## 2020-08-05 ENCOUNTER — Encounter: Payer: Self-pay | Admitting: Family Medicine

## 2020-08-06 ENCOUNTER — Other Ambulatory Visit: Payer: Self-pay

## 2020-08-06 MED ORDER — METFORMIN HCL 500 MG PO TABS
500.0000 mg | ORAL_TABLET | Freq: Every day | ORAL | 0 refills | Status: DC
Start: 2020-08-06 — End: 2020-10-02

## 2020-09-05 ENCOUNTER — Other Ambulatory Visit: Payer: Self-pay

## 2020-09-05 ENCOUNTER — Emergency Department (INDEPENDENT_AMBULATORY_CARE_PROVIDER_SITE_OTHER)
Admission: EM | Admit: 2020-09-05 | Discharge: 2020-09-05 | Disposition: A | Discharge status: Home / Self Care | Payer: BC Managed Care – PPO | Source: Home / Self Care | Attending: Family Medicine | Admitting: Family Medicine

## 2020-09-05 ENCOUNTER — Encounter: Payer: Self-pay | Admitting: Emergency Medicine

## 2020-09-05 DIAGNOSIS — U071 COVID-19: Secondary | ICD-10-CM

## 2020-09-05 LAB — POC SARS CORONAVIRUS 2 AG -  ED: SARS Coronavirus 2 Ag: POSITIVE — AB

## 2020-09-05 NOTE — ED Provider Notes (Signed)
Roberto Charles CARE    CSN: [id number] Arrival date & time: 09/05/20  [id number]      History   Chief Complaint Chief Complaint  Patient presents with  . URI    HPI Roberto Charles is a 50 y.o. male.   Patient complains of cough sore throat fever myalgias headache.  Has been vaccinated for Covid and flu.  Has had some secondary Covid exposure over the last week.  Cough is nonproductive.  HPI  Past Medical History:  Diagnosis Date  . Anxiety   . Diabetes mellitus without complication ([place])   . Headache(784.0)   . Hypertension     Patient Active Problem List   Diagnosis Date Noted  . Hematuria 06/09/2020  . Well adult exam 04/30/2020  . Mixed hyperlipidemia 12/25/2016  . Perennial allergic rhinitis with seasonal variation 12/25/2016  . Obesity due to excess calories without serious comorbidity 09/14/2016  . Snores 09/14/2016    Past Surgical History:  Procedure Laterality Date  . CHOLECYSTECTOMY    . MANDIBLE SURGERY  1995  . VASECTOMY  2010       Home Medications    Prior to Admission medications   Medication Sig Start Date End Date Taking? Authorizing Provider  cetirizine (ZYRTEC) 10 MG tablet Take 10 mg by mouth daily.   Yes [provider]  metFORMIN (GLUCOPHAGE) 500 MG tablet Take 1 tablet (500 mg total) by mouth daily with breakfast. 08/06/20 11/04/20 Yes Zigmund Daniel, [place], DO  mometasone (NASONEX) 50 MCG/ACT nasal spray Place into the nose.   Yes [provider]  phenazopyridine (PYRIDIUM) 200 MG tablet Take 1 tablet (200 mg total) by mouth 3 (three) times daily as needed for pain. 06/09/20   Luetta Nutting, DO    Family History Family History  Problem Relation Age of Onset  . Hypertension Father     Social History Social History   Tobacco Use  . Smoking status: Never Smoker  . Smokeless tobacco: Never Used  Vaping Use  . Vaping Use: Never used  Substance Use Topics  . Alcohol use: Yes    Alcohol/week: 2.0 standard drinks     Types: 2 Standard drinks or equivalent per week    Comment: 2 X PER MONTH  . Drug use: No     Allergies   Amoxicillin   Review of Systems Review of Systems  Constitutional: Positive for fever.  HENT: Positive for congestion and sore throat.   Respiratory: Positive for cough.   Musculoskeletal: Positive for myalgias.  All other systems reviewed and are negative.    Physical Exam Triage Vital Signs ED Triage Vitals  Enc Vitals Group     BP 09/05/20 0836 111/71     Pulse Rate 09/05/20 0836 81     Resp --      Temp 09/05/20 0836 99.2 F (37.3 C)     Temp Source 09/05/20 0836 Oral     SpO2 09/05/20 0836 94 %     Weight 09/05/20 0837 (!) 305 lb (138.3 kg)     Height 09/05/20 0837 5\' 6"  (1.676 m)     Head Circumference --      Peak Flow --      Pain Score 09/05/20 0836 5     Pain Loc --      Pain Edu? --      Excl. in [place]? --    No data found.  Updated Vital Signs BP 111/71 (BP Location: Right Arm)   Pulse 81   Temp  99.2 F (37.3 C) (Oral)   Ht 5\' 6"  (1.676 m)   Wt (!) 138.3 kg   SpO2 94%   BMI 49.23 kg/m   Visual Acuity Right Eye Distance:   Left Eye Distance:   Bilateral Distance:    Right Eye Near:   Left Eye Near:    Bilateral Near:     Physical Exam Vitals and nursing note reviewed.  Constitutional:      Appearance: Normal appearance. He is obese.  HENT:     Right Ear: Tympanic membrane normal.     Left Ear: Tympanic membrane normal.     Nose: Nose normal.     Mouth/Throat:     Mouth: Mucous membranes are moist.     Pharynx: Posterior oropharyngeal erythema present.  Cardiovascular:     Rate and Rhythm: Normal rate and regular rhythm.  Pulmonary:     Effort: Pulmonary effort is normal.     Breath sounds: Normal breath sounds.  Neurological:     General: No focal deficit present.     Mental Status: He is alert and oriented to person, place, and time.      UC Treatments / Results  Labs (all labs ordered are listed, but only abnormal  results are displayed) Labs Reviewed - No data to display  EKG   Radiology No results found.  Procedures Procedures (including critical care time)  Medications Ordered in UC Medications - No data to display  Initial Impression / Assessment and Plan / UC Course  I have reviewed the triage vital signs and the nursing notes.  Pertinent labs & imaging results that were available during my care of the patient were reviewed by me and considered in my medical decision making (see chart for details).     Covid Final Clinical Impressions(s) / UC Diagnoses   Final diagnoses:  None   Discharge Instructions   None    ED Prescriptions    None     PDMP not reviewed this encounter.   , MD 09/05/20 [phone]

## 2020-09-05 NOTE — ED Triage Notes (Signed)
Patient c/o fever, body aches since Thursday, sore throat, cough, congestion, SOB.  Patient has been taking NyQuil and DayQuil.  Patient is vaccinated.

## 2020-09-10 ENCOUNTER — Encounter: Payer: Self-pay | Admitting: Family Medicine

## 2020-10-02 MED ORDER — METFORMIN HCL 500 MG PO TABS
500.0000 mg | ORAL_TABLET | Freq: Every day | ORAL | 1 refills | Status: DC
Start: 2020-10-02 — End: 2021-03-23

## 2020-10-02 NOTE — Addendum Note (Signed)
Addended by: Narda Rutherford on: 10/02/2020 10:45 AM   Modules accepted: Orders

## 2020-11-30 DIAGNOSIS — I639 Cerebral infarction, unspecified: Secondary | ICD-10-CM | POA: Diagnosis not present

## 2020-11-30 DIAGNOSIS — R202 Paresthesia of skin: Secondary | ICD-10-CM | POA: Diagnosis not present

## 2020-11-30 DIAGNOSIS — Z794 Long term (current) use of insulin: Secondary | ICD-10-CM | POA: Diagnosis not present

## 2020-11-30 DIAGNOSIS — G43909 Migraine, unspecified, not intractable, without status migrainosus: Secondary | ICD-10-CM | POA: Diagnosis not present

## 2020-11-30 DIAGNOSIS — R200 Anesthesia of skin: Secondary | ICD-10-CM | POA: Diagnosis not present

## 2020-11-30 DIAGNOSIS — E538 Deficiency of other specified B group vitamins: Secondary | ICD-10-CM | POA: Diagnosis not present

## 2020-11-30 DIAGNOSIS — E119 Type 2 diabetes mellitus without complications: Secondary | ICD-10-CM | POA: Diagnosis not present

## 2020-11-30 DIAGNOSIS — Z6841 Body Mass Index (BMI) 40.0 and over, adult: Secondary | ICD-10-CM | POA: Diagnosis not present

## 2020-11-30 DIAGNOSIS — R519 Headache, unspecified: Secondary | ICD-10-CM | POA: Diagnosis not present

## 2020-11-30 DIAGNOSIS — Z7982 Long term (current) use of aspirin: Secondary | ICD-10-CM | POA: Diagnosis not present

## 2020-11-30 DIAGNOSIS — R29810 Facial weakness: Secondary | ICD-10-CM | POA: Diagnosis not present

## 2020-11-30 DIAGNOSIS — R29898 Other symptoms and signs involving the musculoskeletal system: Secondary | ICD-10-CM | POA: Diagnosis not present

## 2020-11-30 DIAGNOSIS — E669 Obesity, unspecified: Secondary | ICD-10-CM | POA: Insufficient documentation

## 2020-11-30 DIAGNOSIS — E1169 Type 2 diabetes mellitus with other specified complication: Secondary | ICD-10-CM | POA: Insufficient documentation

## 2020-12-01 DIAGNOSIS — R29810 Facial weakness: Secondary | ICD-10-CM | POA: Diagnosis not present

## 2020-12-01 DIAGNOSIS — R200 Anesthesia of skin: Secondary | ICD-10-CM | POA: Diagnosis not present

## 2020-12-01 DIAGNOSIS — R29898 Other symptoms and signs involving the musculoskeletal system: Secondary | ICD-10-CM | POA: Diagnosis not present

## 2020-12-01 DIAGNOSIS — R202 Paresthesia of skin: Secondary | ICD-10-CM | POA: Diagnosis not present

## 2020-12-01 DIAGNOSIS — Z8616 Personal history of COVID-19: Secondary | ICD-10-CM | POA: Diagnosis not present

## 2020-12-02 DIAGNOSIS — R200 Anesthesia of skin: Secondary | ICD-10-CM | POA: Diagnosis not present

## 2020-12-02 DIAGNOSIS — R531 Weakness: Secondary | ICD-10-CM | POA: Diagnosis not present

## 2020-12-02 DIAGNOSIS — G459 Transient cerebral ischemic attack, unspecified: Secondary | ICD-10-CM | POA: Diagnosis not present

## 2020-12-02 DIAGNOSIS — E538 Deficiency of other specified B group vitamins: Secondary | ICD-10-CM | POA: Diagnosis not present

## 2020-12-03 DIAGNOSIS — R519 Headache, unspecified: Secondary | ICD-10-CM | POA: Diagnosis not present

## 2020-12-03 DIAGNOSIS — E1169 Type 2 diabetes mellitus with other specified complication: Secondary | ICD-10-CM | POA: Diagnosis not present

## 2020-12-03 DIAGNOSIS — E538 Deficiency of other specified B group vitamins: Secondary | ICD-10-CM | POA: Diagnosis not present

## 2020-12-18 DIAGNOSIS — R0683 Snoring: Secondary | ICD-10-CM | POA: Diagnosis not present

## 2020-12-18 DIAGNOSIS — G43009 Migraine without aura, not intractable, without status migrainosus: Secondary | ICD-10-CM | POA: Diagnosis not present

## 2021-01-20 DIAGNOSIS — F4323 Adjustment disorder with mixed anxiety and depressed mood: Secondary | ICD-10-CM | POA: Diagnosis not present

## 2021-01-21 DIAGNOSIS — Z6841 Body Mass Index (BMI) 40.0 and over, adult: Secondary | ICD-10-CM | POA: Diagnosis not present

## 2021-01-21 DIAGNOSIS — R0683 Snoring: Secondary | ICD-10-CM | POA: Diagnosis not present

## 2021-01-22 DIAGNOSIS — L820 Inflamed seborrheic keratosis: Secondary | ICD-10-CM | POA: Diagnosis not present

## 2021-01-24 DIAGNOSIS — I471 Supraventricular tachycardia: Secondary | ICD-10-CM | POA: Diagnosis not present

## 2021-01-26 DIAGNOSIS — H40013 Open angle with borderline findings, low risk, bilateral: Secondary | ICD-10-CM | POA: Diagnosis not present

## 2021-01-26 LAB — HM DIABETES EYE EXAM

## 2021-01-28 DIAGNOSIS — G4733 Obstructive sleep apnea (adult) (pediatric): Secondary | ICD-10-CM | POA: Diagnosis not present

## 2021-01-29 DIAGNOSIS — G4733 Obstructive sleep apnea (adult) (pediatric): Secondary | ICD-10-CM | POA: Diagnosis not present

## 2021-02-09 DIAGNOSIS — F4323 Adjustment disorder with mixed anxiety and depressed mood: Secondary | ICD-10-CM | POA: Diagnosis not present

## 2021-03-03 DIAGNOSIS — F4323 Adjustment disorder with mixed anxiety and depressed mood: Secondary | ICD-10-CM | POA: Diagnosis not present

## 2021-03-03 DIAGNOSIS — G4733 Obstructive sleep apnea (adult) (pediatric): Secondary | ICD-10-CM | POA: Diagnosis not present

## 2021-03-14 ENCOUNTER — Inpatient Hospital Stay (HOSPITAL_BASED_OUTPATIENT_CLINIC_OR_DEPARTMENT_OTHER)
Admission: EM | Admit: 2021-03-14 | Discharge: 2021-03-18 | DRG: 392 | Disposition: A | Discharge status: Home / Self Care | Payer: BC Managed Care – PPO | Attending: Internal Medicine | Admitting: Internal Medicine

## 2021-03-14 ENCOUNTER — Encounter (HOSPITAL_BASED_OUTPATIENT_CLINIC_OR_DEPARTMENT_OTHER): Payer: Self-pay | Admitting: Emergency Medicine

## 2021-03-14 ENCOUNTER — Other Ambulatory Visit: Payer: Self-pay

## 2021-03-14 ENCOUNTER — Emergency Department (HOSPITAL_BASED_OUTPATIENT_CLINIC_OR_DEPARTMENT_OTHER): Payer: BC Managed Care – PPO

## 2021-03-14 DIAGNOSIS — K5792 Diverticulitis of intestine, part unspecified, without perforation or abscess without bleeding: Secondary | ICD-10-CM | POA: Diagnosis not present

## 2021-03-14 DIAGNOSIS — K5900 Constipation, unspecified: Secondary | ICD-10-CM | POA: Diagnosis present

## 2021-03-14 DIAGNOSIS — I10 Essential (primary) hypertension: Secondary | ICD-10-CM | POA: Diagnosis present

## 2021-03-14 DIAGNOSIS — Z6841 Body Mass Index (BMI) 40.0 and over, adult: Secondary | ICD-10-CM

## 2021-03-14 DIAGNOSIS — G4730 Sleep apnea, unspecified: Secondary | ICD-10-CM | POA: Diagnosis not present

## 2021-03-14 DIAGNOSIS — E782 Mixed hyperlipidemia: Secondary | ICD-10-CM | POA: Diagnosis not present

## 2021-03-14 DIAGNOSIS — Z79899 Other long term (current) drug therapy: Secondary | ICD-10-CM

## 2021-03-14 DIAGNOSIS — Z880 Allergy status to penicillin: Secondary | ICD-10-CM

## 2021-03-14 DIAGNOSIS — Z20822 Contact with and (suspected) exposure to covid-19: Secondary | ICD-10-CM | POA: Diagnosis present

## 2021-03-14 DIAGNOSIS — G43909 Migraine, unspecified, not intractable, without status migrainosus: Secondary | ICD-10-CM | POA: Diagnosis present

## 2021-03-14 DIAGNOSIS — K5732 Diverticulitis of large intestine without perforation or abscess without bleeding: Secondary | ICD-10-CM | POA: Diagnosis not present

## 2021-03-14 DIAGNOSIS — Z7984 Long term (current) use of oral hypoglycemic drugs: Secondary | ICD-10-CM | POA: Diagnosis not present

## 2021-03-14 DIAGNOSIS — E119 Type 2 diabetes mellitus without complications: Secondary | ICD-10-CM | POA: Diagnosis not present

## 2021-03-14 DIAGNOSIS — R109 Unspecified abdominal pain: Secondary | ICD-10-CM | POA: Diagnosis not present

## 2021-03-14 DIAGNOSIS — E669 Obesity, unspecified: Secondary | ICD-10-CM | POA: Diagnosis not present

## 2021-03-14 DIAGNOSIS — K5720 Diverticulitis of large intestine with perforation and abscess without bleeding: Secondary | ICD-10-CM | POA: Diagnosis present

## 2021-03-14 DIAGNOSIS — Z8249 Family history of ischemic heart disease and other diseases of the circulatory system: Secondary | ICD-10-CM | POA: Diagnosis not present

## 2021-03-14 LAB — LACTIC ACID, PLASMA: Lactic Acid, Venous: 1.1 mmol/L (ref 0.5–1.9)

## 2021-03-14 LAB — URINALYSIS, ROUTINE W REFLEX MICROSCOPIC
Bilirubin Urine: NEGATIVE
Glucose, UA: NEGATIVE mg/dL
Hgb urine dipstick: NEGATIVE
Ketones, ur: NEGATIVE mg/dL
Nitrite: NEGATIVE
Protein, ur: NEGATIVE mg/dL
Specific Gravity, Urine: >1.03 — ABNORMAL HIGH (ref 1.005–1.030)
pH: 5.5 (ref 5.0–8.0)

## 2021-03-14 LAB — CBC WITH DIFFERENTIAL/PLATELET
Abs Immature Granulocytes: 0.06 10*3/uL (ref 0.00–0.07)
Basophils Absolute: 0 10*3/uL (ref 0.0–0.1)
Basophils Relative: 0 %
Eosinophils Absolute: 0.1 10*3/uL (ref 0.0–0.5)
Eosinophils Relative: 1 %
HCT: 40.2 % (ref 39.0–52.0)
Hemoglobin: 13.8 g/dL (ref 13.0–17.0)
Immature Granulocytes: 1 %
Lymphocytes Relative: 14 %
Lymphs Abs: 1.6 10*3/uL (ref 0.7–4.0)
MCH: 27.8 pg (ref 26.0–34.0)
MCHC: 34.3 g/dL (ref 30.0–36.0)
MCV: 81 fL (ref 80.0–100.0)
Monocytes Absolute: 0.8 10*3/uL (ref 0.1–1.0)
Monocytes Relative: 6 %
Neutro Abs: 9.2 10*3/uL — ABNORMAL HIGH (ref 1.7–7.7)
Neutrophils Relative %: 78 %
Platelets: 215 10*3/uL (ref 150–400)
RBC: 4.96 MIL/uL (ref 4.22–5.81)
RDW: 14.5 % (ref 11.5–15.5)
WBC: 11.8 10*3/uL — ABNORMAL HIGH (ref 4.0–10.5)
nRBC: 0 % (ref 0.0–0.2)

## 2021-03-14 LAB — GLUCOSE, CAPILLARY
Glucose-Capillary: 102 mg/dL — ABNORMAL HIGH (ref 70–99)
Glucose-Capillary: 99 mg/dL (ref 70–99)

## 2021-03-14 LAB — URINALYSIS, MICROSCOPIC (REFLEX)

## 2021-03-14 LAB — COMPREHENSIVE METABOLIC PANEL
ALT: 32 U/L (ref 0–44)
AST: 22 U/L (ref 15–41)
Albumin: 3.8 g/dL (ref 3.5–5.0)
Alkaline Phosphatase: 93 U/L (ref 38–126)
Anion gap: 8 (ref 5–15)
BUN: 14 mg/dL (ref 6–20)
CO2: 25 mmol/L (ref 22–32)
Calcium: 8.6 mg/dL — ABNORMAL LOW (ref 8.9–10.3)
Chloride: 104 mmol/L (ref 98–111)
Creatinine, Ser: 0.71 mg/dL (ref 0.61–1.24)
GFR, Estimated: >60 mL/min (ref >60)
Glucose, Bld: 100 mg/dL — ABNORMAL HIGH (ref 70–99)
Potassium: 3.7 mmol/L (ref 3.5–5.1)
Sodium: 137 mmol/L (ref 135–145)
Total Bilirubin: 0.7 mg/dL (ref 0.3–1.2)
Total Protein: 7.1 g/dL (ref 6.5–8.1)

## 2021-03-14 LAB — RESP PANEL BY RT-PCR (FLU A&B, COVID) ARPGX2
Influenza A by PCR: NEGATIVE
Influenza B by PCR: NEGATIVE
SARS Coronavirus 2 by RT PCR: NEGATIVE

## 2021-03-14 MED ORDER — HYDROCODONE-ACETAMINOPHEN 5-325 MG PO TABS
1.0000 | ORAL_TABLET | ORAL | Status: DC | PRN
  Administered 2021-03-14 – 2021-03-17 (×11): 1 via ORAL
  Filled 2021-03-14 (×10): qty 1
  Filled 2021-03-14: qty 2
  Filled 2021-03-14 (×2): qty 1

## 2021-03-14 MED ORDER — ASPIRIN EC 81 MG PO TBEC
81.0000 mg | DELAYED_RELEASE_TABLET | ORAL | Status: DC
  Administered 2021-03-15 – 2021-03-18 (×4): 81 mg via ORAL
  Filled 2021-03-14 (×4): qty 1

## 2021-03-14 MED ORDER — METRONIDAZOLE 500 MG/100ML IV SOLN
500.0000 mg | Freq: Three times a day (TID) | INTRAVENOUS | Status: DC
  Administered 2021-03-14 – 2021-03-18 (×11): 500 mg via INTRAVENOUS
  Filled 2021-03-14 (×11): qty 100

## 2021-03-14 MED ORDER — SODIUM CHLORIDE 0.9 % IV SOLN: INTRAVENOUS | Status: DC

## 2021-03-14 MED ORDER — ACETAMINOPHEN 650 MG RE SUPP: 650.0000 mg | Freq: Four times a day (QID) | RECTAL | Status: DC | PRN

## 2021-03-14 MED ORDER — SODIUM CHLORIDE 0.9 % IV SOLN
INTRAVENOUS | Status: DC | PRN
  Administered 2021-03-14: 250 mL via INTRAVENOUS

## 2021-03-14 MED ORDER — SODIUM CHLORIDE 0.9 % IV BOLUS
1000.0000 mL | Freq: Once | INTRAVENOUS | Status: AC
  Administered 2021-03-14: 1000 mL via INTRAVENOUS

## 2021-03-14 MED ORDER — MORPHINE SULFATE (PF) 2 MG/ML IV SOLN: 2.0000 mg | INTRAVENOUS | Status: DC | PRN

## 2021-03-14 MED ORDER — SODIUM CHLORIDE 0.9 % IV SOLN
2.0000 g | Freq: Three times a day (TID) | INTRAVENOUS | Status: DC
  Administered 2021-03-14 – 2021-03-15 (×3): 2 g via INTRAVENOUS
  Filled 2021-03-14 (×4): qty 2

## 2021-03-14 MED ORDER — INSULIN ASPART 100 UNIT/ML IJ SOLN
0.0000 [IU] | Freq: Three times a day (TID) | INTRAMUSCULAR | Status: DC
  Administered 2021-03-18: 2 [IU] via SUBCUTANEOUS

## 2021-03-14 MED ORDER — METRONIDAZOLE 500 MG/100ML IV SOLN
500.0000 mg | Freq: Once | INTRAVENOUS | Status: AC
  Administered 2021-03-14: 500 mg via INTRAVENOUS
  Filled 2021-03-14: qty 100

## 2021-03-14 MED ORDER — ACETAMINOPHEN 325 MG PO TABS
650.0000 mg | ORAL_TABLET | Freq: Four times a day (QID) | ORAL | Status: DC | PRN
  Administered 2021-03-17: 650 mg via ORAL
  Filled 2021-03-14: qty 2

## 2021-03-14 MED ORDER — FENTANYL CITRATE (PF) 100 MCG/2ML IJ SOLN
100.0000 ug | Freq: Once | INTRAMUSCULAR | Status: AC
  Administered 2021-03-14: 100 ug via INTRAVENOUS
  Filled 2021-03-14: qty 2

## 2021-03-14 MED ORDER — SODIUM CHLORIDE 0.9 % IV SOLN
2.0000 g | Freq: Once | INTRAVENOUS | Status: AC
  Administered 2021-03-14: 2 g via INTRAVENOUS
  Filled 2021-03-14: qty 2

## 2021-03-14 MED ORDER — IOHEXOL 300 MG/ML  SOLN
100.0000 mL | Freq: Once | INTRAMUSCULAR | Status: AC | PRN
  Administered 2021-03-14: 100 mL via INTRAVENOUS

## 2021-03-14 MED ORDER — ENOXAPARIN SODIUM 60 MG/0.6ML IJ SOSY
60.0000 mg | PREFILLED_SYRINGE | INTRAMUSCULAR | Status: DC
  Administered 2021-03-14 – 2021-03-17 (×4): 60 mg via SUBCUTANEOUS
  Filled 2021-03-14 (×4): qty 0.6

## 2021-03-14 MED ORDER — LACTATED RINGERS IV BOLUS
1000.0000 mL | Freq: Once | INTRAVENOUS | Status: AC
  Administered 2021-03-14: 1000 mL via INTRAVENOUS

## 2021-03-14 MED ORDER — HYDROMORPHONE HCL 1 MG/ML IJ SOLN
1.0000 mg | Freq: Once | INTRAMUSCULAR | Status: AC
  Administered 2021-03-14: 1 mg via INTRAVENOUS
  Filled 2021-03-14: qty 1

## 2021-03-14 MED ORDER — ATOGEPANT 60 MG PO TABS
60.0000 mg | ORAL_TABLET | Freq: Every day | ORAL | Status: DC
  Administered 2021-03-14 – 2021-03-17 (×4): 60 mg via ORAL

## 2021-03-14 NOTE — Progress Notes (Signed)
Pt arrived to room 1506 from [facility]

## 2021-03-14 NOTE — Consult Note (Signed)
Roberto Charles May 02, 1971  [id number].    Requesting MD: Dr. Marylyn Ishihara Chief Complaint/Reason for Consult: diverticulitis  HPI:  Mr. Gully is a 50 yo male who presented today with several days of abdominal pain. The pain is in the lower abdomen, primarily the LLQ, and began about 5 days ago. He has also had constipation and bloating. The pain worsened this morning. He called his PCP, who recommended that he go to the ED. WBC was 11.8. A CT scan showed sigmoid diverticulitis with phlegmonous changes. IV antibiotics have been started.  The patient has never had a colonoscopy and has no known family history of colorectal cancer. He has had a lap cholecystectomy but no other abdominal surgeries.  ROS: Review of Systems  Constitutional:  Negative for chills and fever.  Eyes:  Negative for redness.  Respiratory:  Negative for shortness of breath and stridor.   Cardiovascular:  Negative for chest pain.  Gastrointestinal:  Positive for abdominal pain and constipation.  Skin:  Negative for rash.  Neurological:  Negative for dizziness and weakness.  Psychiatric/Behavioral:  Negative for memory loss.    Family History  Problem Relation Age of Onset   Hypertension Father     Past Medical History:  Diagnosis Date   Anxiety    Diabetes mellitus without complication ([place])    KDXIPJAS(505.3)    Hypertension     Past Surgical History:  Procedure Laterality Date   [place]  2010    Social History:  reports that he has never smoked. He has never used smokeless tobacco. He reports previous alcohol use of about 2.0 standard drinks of alcohol per week. He reports that he does not use drugs.  Allergies:  Allergies  Allergen Reactions   Amoxicillin Other (See Comments)    Causes blood in intestines    Medications Prior to Admission  Medication Sig Dispense Refill   acetaminophen (TYLENOL) 500 MG tablet Take 500-1,000 mg by mouth every  6 (six) hours as needed for headache (pain).     aspirin EC 81 MG tablet Take 81 mg by mouth See admin instructions. Take one tablet (81 mg) by mouth Monday thru Friday mornings. Swallow whole.     Atogepant (QULIPTA) 60 MG TABS Take 60 mg by mouth at bedtime.     baclofen (LIORESAL) 10 MG tablet Take 10 mg by mouth 2 (two) times daily as needed (migraines).     Calcium Carbonate (CALCIUM 600 PO) Take 600 mg by mouth every morning.     cetirizine (ZYRTEC) 10 MG tablet Take 10 mg by mouth daily as needed (seasonal allergies).     cyanocobalamin 2000 MCG tablet Take 2,000 mcg by mouth every morning. Vitamin B12     metFORMIN (GLUCOPHAGE) 500 MG tablet Take 1 tablet (500 mg total) by mouth daily with breakfast. 90 tablet 1   Multiple Vitamin (MULTIVITAMIN WITH MINERALS) TABS tablet Take 1 tablet by mouth every morning.     NIACIN PO Take 1 tablet by mouth every morning.     PRESCRIPTION MEDICATION Inhale into the lungs at bedtime. BIPAP     triamcinolone (NASACORT) 55 MCG/ACT AERO nasal inhaler Place 2 sprays into the nose at bedtime as needed (seasonal allergies).       Physical Exam: Blood pressure 106/69, pulse 64, temperature 98 F (36.7 C), temperature source Oral, resp. rate 20, height 5\' 6"  (1.676 m), weight 129.3 kg, SpO2 100 %. General: resting  comfortably, appears stated age, no apparent distress Neurological: alert and oriented, no focal deficits, cranial nerves grossly in tact HEENT: normocephalic, atraumatic, oropharynx clear, no scleral icterus CV: regular rate and rhythm, no murmurs, extremities warm and well-perfused Respiratory: normal work of breathing, lungs clear to auscultation bilaterally, symmetric chest wall expansion Abdomen: soft, nondistended, mild LLQ tenderness to palpation. No masses or organomegaly. Extremities: warm and well-perfused, no deformities, moving all extremities spontaneously Psychiatric: normal mood and affect Skin: warm and dry, no jaundice, no  rashes or lesions   Results for orders placed or performed during the hospital encounter of 03/14/21 (from the past 48 hour(s))  Comprehensive metabolic panel     Status: Abnormal   Collection Time: 03/14/21  9:32 AM  Result Value Ref Range   Sodium 137 135 - 145 mmol/L   Potassium 3.7 3.5 - 5.1 mmol/L   Chloride 104 98 - 111 mmol/L   CO2 25 22 - 32 mmol/L   Glucose, Bld 100 (H) 70 - 99 mg/dL    Comment: Glucose reference range applies only to samples taken after fasting for at least 8 hours.   BUN 14 6 - 20 mg/dL   Creatinine, Ser 0.71 0.61 - 1.24 mg/dL   Calcium 8.6 (L) 8.9 - 10.3 mg/dL   Total Protein 7.1 6.5 - 8.1 g/dL   Albumin 3.8 3.5 - 5.0 g/dL   AST 22 15 - 41 U/L   ALT 32 0 - 44 U/L   Alkaline Phosphatase 93 38 - 126 U/L   Total Bilirubin 0.7 0.3 - 1.2 mg/dL   GFR, Estimated >60 >60 mL/min    Comment: (NOTE) Calculated using the CKD-EPI Creatinine Equation (2021)    Anion gap 8 5 - 15    Comment: Performed at [facility], [address] [place], [place] [address]  CBC with Differential     Status: Abnormal   Collection Time: 03/14/21  9:32 AM  Result Value Ref Range   WBC 11.8 (H) 4.0 - 10.5 K/uL   RBC 4.96 4.22 - 5.81 MIL/uL   Hemoglobin 13.8 13.0 - 17.0 g/dL   HCT 40.2 39.0 - 52.0 %   MCV 81.0 80.0 - 100.0 fL   MCH 27.8 26.0 - 34.0 pg   MCHC 34.3 30.0 - 36.0 g/dL   RDW 14.5 11.5 - 15.5 %   Platelets 215 150 - 400 K/uL   nRBC 0.0 0.0 - 0.2 %   Neutrophils Relative % 78 %   Neutro Abs 9.2 (H) 1.7 - 7.7 K/uL   Lymphocytes Relative 14 %   Lymphs Abs 1.6 0.7 - 4.0 K/uL   Monocytes Relative 6 %   Monocytes Absolute 0.8 0.1 - 1.0 K/uL   Eosinophils Relative 1 %   Eosinophils Absolute 0.1 0.0 - 0.5 K/uL   Basophils Relative 0 %   Basophils Absolute 0.0 0.0 - 0.1 K/uL   Immature Granulocytes 1 %   Abs Immature Granulocytes 0.06 0.00 - 0.07 K/uL    Comment: Performed at [facility], [address] [place], [place] [address]  Urinalysis,  Routine w reflex microscopic Urine, Clean Catch     Status: Abnormal   Collection Time: 03/14/21  9:34 AM  Result Value Ref Range   Color, Urine YELLOW YELLOW   APPearance CLEAR CLEAR   Specific Gravity, Urine >1.030 (H) 1.005 - 1.030   pH 5.5 5.0 - 8.0   Glucose, UA NEGATIVE NEGATIVE mg/dL   Hgb urine dipstick  NEGATIVE NEGATIVE   Bilirubin Urine NEGATIVE NEGATIVE   Ketones, ur NEGATIVE NEGATIVE mg/dL   Protein, ur NEGATIVE NEGATIVE mg/dL   Nitrite NEGATIVE NEGATIVE   Leukocytes,Ua TRACE (A) NEGATIVE    Comment: Performed at [facility], [address] [place], [place] [address]  Urinalysis, Microscopic (reflex)     Status: Abnormal   Collection Time: 03/14/21  9:34 AM  Result Value Ref Range   RBC / HPF 0-5 0 - 5 RBC/hpf   WBC, UA 0-5 0 - 5 WBC/hpf   Bacteria, UA MANY (A) NONE SEEN   Squamous Epithelial / LPF 0-5 0 - 5   Mucus PRESENT     Comment: Performed at [facility], [address] [place], [place] [address]  Resp Panel by RT-PCR (Flu A&B, Covid) Nasopharyngeal Swab     Status: None   Collection Time: 03/14/21 11:42 AM   Specimen: Nasopharyngeal Swab; Nasopharyngeal(NP) swabs in vial transport medium  Result Value Ref Range   SARS Coronavirus 2 by RT PCR NEGATIVE NEGATIVE    Comment: (NOTE) SARS-CoV-2 target nucleic acids are NOT DETECTED.  The SARS-CoV-2 RNA is generally detectable in upper respiratory specimens during the acute phase of infection. The lowest concentration of SARS-CoV-2 viral copies this assay can detect is 138 copies/mL. A negative result does not preclude SARS-Cov-2 infection and should not be used as the sole basis for treatment or other patient management decisions. A negative result may occur with  improper specimen collection/handling, submission of specimen other than nasopharyngeal swab, presence of viral mutation(s) within the areas targeted by this assay, and inadequate number of viral copies(<138 copies/mL). A  negative result must be combined with clinical observations, patient history, and epidemiological information. The expected result is Negative.  Fact Sheet for Patients:  EntrepreneurPulse.com.au  Fact Sheet for Healthcare Providers:  IncredibleEmployment.be  This test is no t yet approved or cleared by the [place] FDA and  has been authorized for detection and/or diagnosis of SARS-CoV-2 by FDA under an Emergency Use Authorization (EUA). This EUA will remain  in effect (meaning this test can be used) for the duration of the COVID-19 declaration under Section 564(b)(1) of the Act, 21 U.S.C.section 360bbb-3(b)(1), unless the authorization is terminated  or revoked sooner.       Influenza A by PCR NEGATIVE NEGATIVE   Influenza B by PCR NEGATIVE NEGATIVE    Comment: (NOTE) The Xpert Xpress SARS-CoV-2/FLU/RSV plus assay is intended as an aid in the diagnosis of influenza from Nasopharyngeal swab specimens and should not be used as a sole basis for treatment. Nasal washings and aspirates are unacceptable for Xpert Xpress SARS-CoV-2/FLU/RSV testing.  Fact Sheet for Patients: EntrepreneurPulse.com.au  Fact Sheet for Healthcare Providers: IncredibleEmployment.be  This test is not yet approved or cleared by the [place] FDA and has been authorized for detection and/or diagnosis of SARS-CoV-2 by FDA under an Emergency Use Authorization (EUA). This EUA will remain in effect (meaning this test can be used) for the duration of the COVID-19 declaration under Section 564(b)(1) of the Act, 21 U.S.C. section 360bbb-3(b)(1), unless the authorization is terminated or revoked.  Performed at [facility], [address] [place], [place] [address]   Lactic acid, plasma     Status: None   Collection Time: 03/14/21 11:42 AM  Result Value Ref Range   Lactic Acid, Venous 1.1 0.5 - 1.9 mmol/L     Comment: Performed at [facility], [address] [place], [place] [address]  Glucose, capillary     Status: Abnormal   Collection Time: 03/14/21  4:54 PM  Result Value Ref Range   Glucose-Capillary 102 (H) 70 - 99 mg/dL    Comment: Glucose reference range applies only to samples taken after fasting for at least 8 hours.   CT ABDOMEN PELVIS W CONTRAST  Result Date: 03/14/2021 CLINICAL DATA:  Lower abdominal pain and pressure several days. Possible diverticulitis. EXAM: CT ABDOMEN AND PELVIS WITH CONTRAST TECHNIQUE: Multidetector CT imaging of the abdomen and pelvis was performed using the standard protocol following bolus administration of intravenous contrast. CONTRAST:  116mL OMNIPAQUE IOHEXOL 300 MG/ML  SOLN COMPARISON:  10/25/2015 FINDINGS: Lower chest: Lung bases are clear. Hepatobiliary: Prior cholecystectomy. 8 mm calcification projects over the common bile duct. No significant intrahepatic ductal dilatation. Pancreas: Normal. Spleen: Normal. Adrenals/Urinary Tract: Adrenal glands are normal. Kidneys are normal in size and demonstrate several punctate bilateral stones. No evidence of hydronephrosis. No focal mass. Ureters and bladder are normal. Stomach/Bowel: Stomach and small bowel are normal. Appendix is normal. There is diverticulosis of the colon most prominent over the sigmoid colon. There is moderate adjacent inflammatory change over the sigmoid colon in the midline pelvis with minimal adjacent free fluid as findings are compatible with acute diverticulitis. No evidence of free peritoneal air. Small fluid collection abutting the inflamed diverticula likely phlegmon, although may represent evolving diverticular abscess. Vascular/Lymphatic: Abdominal aorta is normal. No significant adenopathy. Reproductive: Normal. Other: None. Musculoskeletal: No focal abnormality. IMPRESSION: 1. Colonic diverticulosis with evidence of acute diverticulitis involving the sigmoid colon in the midline  pelvis. Small fluid collection abutting the inflamed diverticula likely phlegmon, although may represent evolving diverticular abscess. No evidence of free peritoneal air. 2. Bilateral nephrolithiasis. 3. 8 mm stone over the common bile duct without significant intrahepatic ductal dilatation. Prior cholecystectomy. Electronically Signed   By: Marin Olp M.D.   On: 03/14/2021 10:59      Assessment/Plan 50 yo male with acute sigmoid diverticulitis. I reviewed his CT scan - there are diverticuli throughout the sigmoid colon with significant wall thickening and stranding. There is possibly a small focus of extraluminal gas but no free intraperitoneal air and no walled-off abscess. The patient is clinically stable and non-toxic. - Continue IV antibiotics - Bowel rest, ok for sips of clear liquids - IV fluid hydration - Will need outpatient colonoscopy in 6-8 weeks - General surgery will follow  Michaelle Birks, [facility], Hepatobiliary and Pancreatic Surgery 03/14/21 6:08 PM

## 2021-03-14 NOTE — Progress Notes (Signed)
Notified by EDP of need for admission d/t diverticulitis. [facility] accepts patient to tele bed at [facility] EDP is to remain responsible for orders/medical decisions while patient is holding at [facility]. Upon arrival to [facility], [facility] will assume care. Nursing staff will call patient placement to notify them of patient's arrival so that the proper [facility] member may receive the patient. Thank you.

## 2021-03-14 NOTE — Progress Notes (Signed)
Pharmacy Antibiotic Note  Roberto Charles is a 50 y.o. male admitted on 03/14/2021 with diverticulitis.  Pharmacy has been consulted for Cefepime dosing.  Plan: Cefepime 2g IV q8h Follow up renal function, culture results, and clinical course.   Height: 5\' 6"  (167.6 cm) Weight: 129.3 kg (285 lb) IBW/kg (Calculated) : 63.8  Temp (24hrs), Avg:98 F (36.7 C), Min:98 F (36.7 C), Max:98 F (36.7 C)  Recent Labs  Lab 03/14/21 0932 03/14/21 1142  WBC 11.8*  --   CREATININE 0.71  --   LATICACIDVEN  --  1.1    Estimated Creatinine Clearance: 142.2 mL/min (by C-G formula based on SCr of 0.71 mg/dL).    Allergies  Allergen Reactions   Amoxicillin Other (See Comments)    Causes blood in intestines    Antimicrobials this admission: 7/10 Cefepime >> 7/10 metronidazole >>   Dose adjustments this admission:   Microbiology results:   Thank you for allowing pharmacy to be a part of this patient's care.  Gretta Arab PharmD, BCPS Clinical Pharmacist [facility] main pharmacy [phone] 03/14/2021 3:51 PM

## 2021-03-14 NOTE — ED Notes (Signed)
ED Provider at bedside. 

## 2021-03-14 NOTE — ED Provider Notes (Signed)
[facility] Provider Note   CSN: [id number] Arrival date & time: 03/14/21  0857     History Chief Complaint  Patient presents with   Abdominal Pain    Roberto Charles is a 50 y.o. male.  HPI 50 year old male presents with abdominal pain and constipation.  He has been constipated with harder stools throughout the whole week.  Last had a bowel movement yesterday he thinks.  No vomiting.  A couple hours ago the abdominal pain got a lot worse.  Some pain in his low back as well.  No fevers.  Today it feels like his anus is hurting.  Past Medical History:  Diagnosis Date   Anxiety    Diabetes mellitus without complication ([place])    WIOXBDZH(299.2)    Hypertension     Patient Active Problem List   Diagnosis Date Noted   Diverticulitis 03/14/2021   Hematuria 06/09/2020   Well adult exam 04/30/2020   Mixed hyperlipidemia 12/25/2016   Perennial allergic rhinitis with seasonal variation 12/25/2016   Obesity due to excess calories without serious comorbidity 09/14/2016   Snores 09/14/2016    Past Surgical History:  Procedure Laterality Date   [place]  2010       Family History  Problem Relation Age of Onset   Hypertension Father     Social History   Tobacco Use   Smoking status: Never   Smokeless tobacco: Never  Vaping Use   Vaping Use: Never used  Substance Use Topics   Alcohol use: Not Currently    Alcohol/week: 2.0 standard drinks    Types: 2 Standard drinks or equivalent per week    Comment: 2 X PER MONTH   Drug use: No    Home Medications Prior to Admission medications   Medication Sig Start Date End Date Taking? Authorizing Provider  cetirizine (ZYRTEC) 10 MG tablet Take 10 mg by mouth daily.    [provider]  metFORMIN (GLUCOPHAGE) 500 MG tablet Take 1 tablet (500 mg total) by mouth daily with breakfast. 10/02/20 12/31/20  Luetta Nutting, DO  mometasone (NASONEX) 50  MCG/ACT nasal spray Place into the nose.    [provider]  phenazopyridine (PYRIDIUM) 200 MG tablet Take 1 tablet (200 mg total) by mouth 3 (three) times daily as needed for pain. 06/09/20   Luetta Nutting, DO    Allergies    Amoxicillin  Review of Systems   Review of Systems  Constitutional:  Negative for fever.  Gastrointestinal:  Positive for abdominal pain and constipation. Negative for vomiting.  Musculoskeletal:  Positive for back pain.  All other systems reviewed and are negative.  Physical Exam Updated Vital Signs BP 104/63   Pulse 73   Temp 98 F (36.7 C)   Resp 20   Ht 5\' 6"  (1.676 m)   Wt 129.3 kg   SpO2 99%   BMI 46.00 kg/m   Physical Exam Vitals and nursing note reviewed. Exam conducted with a chaperone present.  Constitutional:      General: He is not in acute distress.    Appearance: He is well-developed. He is obese. He is not ill-appearing or diaphoretic.  HENT:     Head: Normocephalic and atraumatic.     Right Ear: External ear normal.     Left Ear: External ear normal.     Nose: Nose normal.  Eyes:     General:  Right eye: No discharge.        Left eye: No discharge.  Cardiovascular:     Rate and Rhythm: Normal rate and regular rhythm.     Heart sounds: Normal heart sounds.  Pulmonary:     Effort: Pulmonary effort is normal.     Breath sounds: Normal breath sounds.  Abdominal:     Palpations: Abdomen is soft.     Tenderness: There is abdominal tenderness in the suprapubic area and left lower quadrant.  Genitourinary:    Rectum: No mass, tenderness, anal fissure, external hemorrhoid or internal hemorrhoid. Normal anal tone.     Comments: No stool on DRE, no fecal impaction Musculoskeletal:     Cervical back: Neck supple.  Skin:    General: Skin is warm and dry.  Neurological:     Mental Status: He is alert.  Psychiatric:        Mood and Affect: Mood is not anxious.    ED Results / Procedures / Treatments   Labs (all  labs ordered are listed, but only abnormal results are displayed) Labs Reviewed  URINALYSIS, ROUTINE W REFLEX MICROSCOPIC - Abnormal; Notable for the following components:      Result Value   Specific Gravity, Urine >1.030 (*)    Leukocytes,Ua TRACE (*)    All other components within normal limits  COMPREHENSIVE METABOLIC PANEL - Abnormal; Notable for the following components:   Glucose, Bld 100 (*)    Calcium 8.6 (*)    All other components within normal limits  CBC WITH DIFFERENTIAL/PLATELET - Abnormal; Notable for the following components:   WBC 11.8 (*)    Neutro Abs 9.2 (*)    All other components within normal limits  URINALYSIS, MICROSCOPIC (REFLEX) - Abnormal; Notable for the following components:   Bacteria, UA MANY (*)    All other components within normal limits  RESP PANEL BY RT-PCR (FLU A&B, COVID) [id number]  LACTIC ACID, PLASMA    EKG None  Radiology CT ABDOMEN PELVIS W CONTRAST  Result Date: 03/14/2021 CLINICAL DATA:  Lower abdominal pain and pressure several days. Possible diverticulitis. EXAM: CT ABDOMEN AND PELVIS WITH CONTRAST TECHNIQUE: Multidetector CT imaging of the abdomen and pelvis was performed using the standard protocol following bolus administration of intravenous contrast. CONTRAST:  131mL OMNIPAQUE IOHEXOL 300 MG/ML  SOLN COMPARISON:  10/25/2015 FINDINGS: Lower chest: Lung bases are clear. Hepatobiliary: Prior cholecystectomy. 8 mm calcification projects over the common bile duct. No significant intrahepatic ductal dilatation. Pancreas: Normal. Spleen: Normal. Adrenals/Urinary Tract: Adrenal glands are normal. Kidneys are normal in size and demonstrate several punctate bilateral stones. No evidence of hydronephrosis. No focal mass. Ureters and bladder are normal. Stomach/Bowel: Stomach and small bowel are normal. Appendix is normal. There is diverticulosis of the colon most prominent over the sigmoid colon. There is moderate adjacent inflammatory change over  the sigmoid colon in the midline pelvis with minimal adjacent free fluid as findings are compatible with acute diverticulitis. No evidence of free peritoneal air. Small fluid collection abutting the inflamed diverticula likely phlegmon, although may represent evolving diverticular abscess. Vascular/Lymphatic: Abdominal aorta is normal. No significant adenopathy. Reproductive: Normal. Other: None. Musculoskeletal: No focal abnormality. IMPRESSION: 1. Colonic diverticulosis with evidence of acute diverticulitis involving the sigmoid colon in the midline pelvis. Small fluid collection abutting the inflamed diverticula likely phlegmon, although may represent evolving diverticular abscess. No evidence of free peritoneal air. 2. Bilateral nephrolithiasis. 3. 8 mm stone over the common bile duct without significant intrahepatic ductal dilatation.  Prior cholecystectomy. Electronically Signed   By: Marin Olp M.D.   On: 03/14/2021 10:59    Procedures Procedures   Medications Ordered in ED Medications  0.9 %  sodium chloride infusion (250 mLs Intravenous New Bag/Given 03/14/21 1145)  sodium chloride 0.9 % bolus 1,000 mL ( Intravenous Stopped 03/14/21 1053)  HYDROmorphone (DILAUDID) injection 1 mg (1 mg Intravenous Given 03/14/21 0945)  iohexol (OMNIPAQUE) 300 MG/ML solution 100 mL (100 mLs Intravenous Contrast Given 03/14/21 1034)  lactated ringers bolus 1,000 mL ( Intravenous Stopped 03/14/21 1257)  ceFEPIme (MAXIPIME) 2 g in sodium chloride 0.9 % 100 mL IVPB (0 g Intravenous Stopped 03/14/21 1219)    And  metroNIDAZOLE (FLAGYL) IVPB 500 mg (0 mg Intravenous Stopped 03/14/21 1329)  fentaNYL (SUBLIMAZE) injection 100 mcg (100 mcg Intravenous Given 03/14/21 1156)    ED Course  I have reviewed the triage vital signs and the nursing notes.  Pertinent labs & imaging results that were available during my care of the patient were reviewed by me and considered in my medical decision making (see chart for  details).    MDM Rules/Calculators/A&P                          Patient CT scan has been personally reviewed and shows diverticulitis with some inflammatory changes concerning for early phlegmon.  There is no discrete abscess.  He did drop his pressure into the 90s which was probably more Dilaudid related rather than sepsis.  Given some fluids with good response.  However he is still having poor pain control and so I think he probably needs to be admitted for at least observation with some IV antibiotics and fluids.  No indication for acute surgery or IR procedure.  Discussed with Dr. Marylyn Ishihara for admission. Final Clinical Impression(s) / ED Diagnoses Final diagnoses:  Acute diverticulitis    Rx / DC Orders ED Discharge Orders     None        Sherwood Gambler, MD 03/14/21 1418

## 2021-03-14 NOTE — H&P (Signed)
History and Physical    Roberto Charles:[id number] DOB: 1971-01-20 DOA: 03/14/2021  PCP: Luetta Nutting, DO  Patient coming from: Home  Chief Complaint: abdominal pain  HPI: Roberto Charles is a 50 y.o. male with medical history significant of DM, morbid obesity. Presenting with abdominal pain. Symptoms started a week ago. He had some discomfort in the low center of his stomach and seemed to radiate to his anus. The pain was initially mild and fleeting. He didn't think much of it and decided to power through. However, his pain greatly intensified this morning. It felt like "someone was ripping my insides". It was constant. He felt extremely bloated. He didn't try any medicines to make it better. He called his PCP. It was recommended that he come to the ED for assistance.     ED Course: CT showed diverticulitis of the sigmoid colon. He was started on cefepime and flagyl. TRH was called for admission.   Review of Systems:  Denies CP, dyspnea, palpitations, lightheadedness, dizziness, N/V/D, fever. Review of systems is otherwise negative for all not mentioned in HPI.   PMHx Past Medical History:  Diagnosis Date   Anxiety    Diabetes mellitus without complication ([place])    NATFTDDU(202.5)    Hypertension     PSHx Past Surgical History:  Procedure Laterality Date   CHOLECYSTECTOMY     MANDIBLE SURGERY  1995   VASECTOMY  2010    SocHx  reports that he has never smoked. He has never used smokeless tobacco. He reports previous alcohol use of about 2.0 standard drinks of alcohol per week. He reports that he does not use drugs.  Allergies  Allergen Reactions   Amoxicillin Other (See Comments)    Causes blood in intestines    FamHx Family History  Problem Relation Age of Onset   Hypertension Father     Prior to Admission medications   Medication Sig Start Date End Date Taking? Authorizing Provider  cetirizine (ZYRTEC) 10 MG tablet Take 10 mg by mouth daily.    [provider]  metFORMIN (GLUCOPHAGE) 500 MG tablet Take 1 tablet (500 mg total) by mouth daily with breakfast. 10/02/20 12/31/20  Luetta Nutting, DO  mometasone (NASONEX) 50 MCG/ACT nasal spray Place into the nose.    [provider]  phenazopyridine (PYRIDIUM) 200 MG tablet Take 1 tablet (200 mg total) by mouth 3 (three) times daily as needed for pain. 06/09/20   Luetta Nutting, DO    Physical Exam: Vitals:   03/14/21 0909 03/14/21 1000 03/14/21 1030 03/14/21 1130  BP:  (!) 107/53 (!) 97/48 104/63  Pulse:  67 66 73  Resp:  20 20 20   Temp:      SpO2:  97% 95% 99%  Weight: 129.3 kg     Height: 5\' 6"  (1.676 m)       General: 50 y.o. male resting in bed in NAD Eyes: PERRL, normal sclera ENMT: Nares patent w/o discharge, orophaynx clear, dentition normal, ears w/o discharge/lesions/ulcers Neck: Supple, trachea midline Cardiovascular: RRR, +S1, S2, no m/g/r, equal pulses throughout Respiratory: CTABL, no w/r/r, normal WOB GI: BS+, obese, ND, TTP hypogastric area, no masses noted, no organomegaly noted MSK: No e/c/c Skin: No rashes, bruises, ulcerations noted Neuro: A&O x 3, no focal deficits Psyc: Appropriate interaction and affect, calm/cooperative  Labs on Admission: I have personally reviewed following labs and imaging studies  CBC: Recent Labs  Lab 03/14/21 0932  WBC 11.8*  NEUTROABS 9.2*  HGB 13.8  HCT  40.2  MCV 81.0  PLT 259   Basic Metabolic Panel: Recent Labs  Lab 03/14/21 0932  NA 137  K 3.7  CL 104  CO2 25  GLUCOSE 100*  BUN 14  CREATININE 0.71  CALCIUM 8.6*   GFR: Estimated Creatinine Clearance: 142.2 mL/min (by C-G formula based on SCr of 0.71 mg/dL). Liver Function Tests: Recent Labs  Lab 03/14/21 0932  AST 22  ALT 32  ALKPHOS 93  BILITOT 0.7  PROT 7.1  ALBUMIN 3.8   No results for input(s): LIPASE, AMYLASE in the last 168 hours. No results for input(s): AMMONIA in the last 168 hours. Coagulation Profile: No results for input(s):  INR, PROTIME in the last 168 hours. Cardiac Enzymes: No results for input(s): CKTOTAL, CKMB, CKMBINDEX, TROPONINI in the last 168 hours. BNP (last 3 results) No results for input(s): PROBNP in the last 8760 hours. HbA1C: No results for input(s): HGBA1C in the last 72 hours. CBG: No results for input(s): GLUCAP in the last 168 hours. Lipid Profile: No results for input(s): CHOL, HDL, LDLCALC, TRIG, CHOLHDL, LDLDIRECT in the last 72 hours. Thyroid Function Tests: No results for input(s): TSH, T4TOTAL, FREET4, T3FREE, THYROIDAB in the last 72 hours. Anemia Panel: No results for input(s): VITAMINB12, FOLATE, FERRITIN, TIBC, IRON, RETICCTPCT in the last 72 hours. Urine analysis:    Component Value Date/Time   COLORURINE YELLOW 03/14/2021 0934   APPEARANCEUR CLEAR 03/14/2021 0934   LABSPEC >1.030 (H) 03/14/2021 0934   PHURINE 5.5 03/14/2021 0934   GLUCOSEU NEGATIVE 03/14/2021 0934   HGBUR NEGATIVE 03/14/2021 0934   BILIRUBINUR NEGATIVE 03/14/2021 0934   BILIRUBINUR negative 06/09/2020 1718   KETONESUR NEGATIVE 03/14/2021 0934   PROTEINUR NEGATIVE 03/14/2021 0934   UROBILINOGEN 0.2 06/09/2020 1718   NITRITE NEGATIVE 03/14/2021 0934   LEUKOCYTESUR TRACE (A) 03/14/2021 0934    Radiological Exams on Admission: CT ABDOMEN PELVIS W CONTRAST  Result Date: 03/14/2021 CLINICAL DATA:  Lower abdominal pain and pressure several days. Possible diverticulitis. EXAM: CT ABDOMEN AND PELVIS WITH CONTRAST TECHNIQUE: Multidetector CT imaging of the abdomen and pelvis was performed using the standard protocol following bolus administration of intravenous contrast. CONTRAST:  172mL OMNIPAQUE IOHEXOL 300 MG/ML  SOLN COMPARISON:  10/25/2015 FINDINGS: Lower chest: Lung bases are clear. Hepatobiliary: Prior cholecystectomy. 8 mm calcification projects over the common bile duct. No significant intrahepatic ductal dilatation. Pancreas: Normal. Spleen: Normal. Adrenals/Urinary Tract: Adrenal glands are normal.  Kidneys are normal in size and demonstrate several punctate bilateral stones. No evidence of hydronephrosis. No focal mass. Ureters and bladder are normal. Stomach/Bowel: Stomach and small bowel are normal. Appendix is normal. There is diverticulosis of the colon most prominent over the sigmoid colon. There is moderate adjacent inflammatory change over the sigmoid colon in the midline pelvis with minimal adjacent free fluid as findings are compatible with acute diverticulitis. No evidence of free peritoneal air. Small fluid collection abutting the inflamed diverticula likely phlegmon, although may represent evolving diverticular abscess. Vascular/Lymphatic: Abdominal aorta is normal. No significant adenopathy. Reproductive: Normal. Other: None. Musculoskeletal: No focal abnormality. IMPRESSION: 1. Colonic diverticulosis with evidence of acute diverticulitis involving the sigmoid colon in the midline pelvis. Small fluid collection abutting the inflamed diverticula likely phlegmon, although may represent evolving diverticular abscess. No evidence of free peritoneal air. 2. Bilateral nephrolithiasis. 3. 8 mm stone over the common bile duct without significant intrahepatic ductal dilatation. Prior cholecystectomy. Electronically Signed   By: Marin Olp M.D.   On: 03/14/2021 10:59    EKG: None  obtained in ED  Assessment/Plan Acute diverticulitis     - admit to inpt, med-surg     - continue flagyl, cefepime     - CT w/ phlegmon, possible early abscess; have spoken with [facility], they will follow, appreciate assistance     - fluids, pain control     - can have CLD for now   DM2     - SSI, A1c, glucose checks  Morbid obesity     - counsel on diet, lifestyle changes; follow up outpt.  DVT prophylaxis: lovenox  Code Status: FULL  Family Communication: None at bedside  Consults called: General surgery   Status is: Inpatient  Remains inpatient appropriate because:Inpatient level of care appropriate due to  severity of illness  Dispo: The patient is from: Home              Anticipated d/c is to: Home              Patient currently is not medically stable to d/c.   Difficult to place patient No  Time spent coordinating admission: 40 minutes  [facility]  If 7PM-7AM, please contact night-coverage [email or web address]  03/14/2021, 3:06 PM

## 2021-03-14 NOTE — ED Triage Notes (Signed)
Pt arrives pov with driver, reports lower abdominal pain  and pressure for several days that worsened this am. Pt also reports low back pressure that causes difficulty to sit or lie. Pt endorses constipation, denies fever, denies N/V. Reports concern for Appendicitis. Pt denies dysuria

## 2021-03-15 ENCOUNTER — Encounter (HOSPITAL_COMMUNITY): Payer: Self-pay | Admitting: Internal Medicine

## 2021-03-15 LAB — HIV ANTIBODY (ROUTINE TESTING W REFLEX): HIV Screen 4th Generation wRfx: NONREACTIVE

## 2021-03-15 LAB — COMPREHENSIVE METABOLIC PANEL
ALT: 29 U/L (ref 0–44)
AST: 15 U/L (ref 15–41)
Albumin: 3.7 g/dL (ref 3.5–5.0)
Alkaline Phosphatase: 83 U/L (ref 38–126)
Anion gap: 8 (ref 5–15)
BUN: 10 mg/dL (ref 6–20)
CO2: 25 mmol/L (ref 22–32)
Calcium: 8.1 mg/dL — ABNORMAL LOW (ref 8.9–10.3)
Chloride: 104 mmol/L (ref 98–111)
Creatinine, Ser: 0.68 mg/dL (ref 0.61–1.24)
GFR, Estimated: >60 mL/min (ref >60)
Glucose, Bld: 100 mg/dL — ABNORMAL HIGH (ref 70–99)
Potassium: 3.5 mmol/L (ref 3.5–5.1)
Sodium: 137 mmol/L (ref 135–145)
Total Bilirubin: 1.1 mg/dL (ref 0.3–1.2)
Total Protein: 6.3 g/dL — ABNORMAL LOW (ref 6.5–8.1)

## 2021-03-15 LAB — GLUCOSE, CAPILLARY
Glucose-Capillary: 97 mg/dL (ref 70–99)
Glucose-Capillary: 89 mg/dL (ref 70–99)
Glucose-Capillary: 127 mg/dL — ABNORMAL HIGH (ref 70–99)
Glucose-Capillary: 101 mg/dL — ABNORMAL HIGH (ref 70–99)

## 2021-03-15 LAB — CBC
HCT: 39.1 % (ref 39.0–52.0)
Hemoglobin: 13 g/dL (ref 13.0–17.0)
MCH: 27.4 pg (ref 26.0–34.0)
MCHC: 33.2 g/dL (ref 30.0–36.0)
MCV: 82.5 fL (ref 80.0–100.0)
Platelets: 184 10*3/uL (ref 150–400)
RBC: 4.74 MIL/uL (ref 4.22–5.81)
RDW: 14.4 % (ref 11.5–15.5)
WBC: 9.1 10*3/uL (ref 4.0–10.5)
nRBC: 0 % (ref 0.0–0.2)

## 2021-03-15 LAB — HEMOGLOBIN A1C
Hgb A1c MFr Bld: 5.9 % — ABNORMAL HIGH (ref 4.8–5.6)
Mean Plasma Glucose: 122.63 mg/dL

## 2021-03-15 MED ORDER — SODIUM CHLORIDE 0.9 % IV SOLN
2.0000 g | INTRAVENOUS | Status: DC
  Administered 2021-03-15 – 2021-03-17 (×3): 2 g via INTRAVENOUS
  Filled 2021-03-15 (×3): qty 2

## 2021-03-15 NOTE — Progress Notes (Signed)
    CC: abdominal pain  Subjective: He feel better, but has ongoing pain LLQ.  No nausea or vomiting passing gas.  We explained the plan and he appears to understand.   Objective: Vital signs in last 24 hours: Temp:  [98 F (36.7 C)-99.2 F (37.3 C)] 99.2 F (37.3 C) (07/11 0301) Pulse Rate:  [60-78] 60 (07/11 0301) Resp:  [16-20] 16 (07/11 0301) BP: (97-127)/(48-72) 105/68 (07/11 0301) SpO2:  [95 %-100 %] 96 % (07/11 0301) Last BM Date: 03/13/21 120 PO 2300 IV No BM recorded Afebrile, VSS CMP OK WBC 11.8>>9.1  Intake/Output from previous day: 07/10 0701 - 07/11 0700 In: 3411.9 [P.O.:120; I.V.:791.1; IV Piggyback:2500.9] Out: -  Intake/Output this shift: No intake/output data recorded.  General appearance: alert, cooperative, no distress, and mildly obese Resp: clear to auscultation bilaterally and Pt has home BiPap at bedside. GI: soft not distended, tender LLQ, but better than yesterday.    Lab Results:  Recent Labs    03/14/21 0932 03/15/21 0446  WBC 11.8* 9.1  HGB 13.8 13.0  HCT 40.2 39.1  PLT 215 184    BMET Recent Labs    03/14/21 0932 03/15/21 0446  NA 137 137  K 3.7 3.5  CL 104 104  CO2 25 25  GLUCOSE 100* 100*  BUN 14 10  CREATININE 0.71 0.68  CALCIUM 8.6* 8.1*   PT/INR No results for input(s): LABPROT, INR in the last 72 hours.  Recent Labs  Lab 03/14/21 0932 03/15/21 0446  AST 22 15  ALT 32 29  ALKPHOS 93 83  BILITOT 0.7 1.1  PROT 7.1 6.3*  ALBUMIN 3.8 3.7     Lipase  No results found for: LIPASE   Medications:  aspirin EC  81 mg Oral Once per day on Mon Tue Wed Thu Fri   Atogepant  60 mg Oral QHS   enoxaparin (LOVENOX) injection  60 mg Subcutaneous Q24H   insulin aspart  0-15 Units Subcutaneous TID WC    sodium chloride 75 mL/hr at 03/15/21 0557   ceFEPime (MAXIPIME) IV 2 g (03/15/21 0500)   metronidazole 500 mg (03/15/21 0559)    Assessment/Plan Sigmoid diverticulitis with possible microperforation  FEN:  clears/IV fluids VTE:  Lovenox Abx:  cefepime/metronidazole 7/10 >> day 2  Sleep apnea with BiPAP  Plan:  Continue antibiotics, and clears for now.  Recheck CBC tomorrow, and advance diet when pain is better.  Eventually transition  to PO abx, follow up OP colonoscopy about 6 weeks.     LOS: 1 day    Aztlan Coll 03/15/2021 Please see Amion

## 2021-03-15 NOTE — Progress Notes (Signed)
PROGRESS NOTE  Roberto Charles  CBJ:[id number] DOB: 10/17/1970 DOA: 03/14/2021 PCP: Luetta Nutting, DO   Brief Narrative: Roberto Charles is a 50 y.o. male with a history of obesity and T2DM who presented to the ED with about a week of LLQ abdominal discomfort. CT abd/pelvis revealed sigmoid diverticulitis with possible microperforation for which cefepime and flagyl were started and patient admitted with surgery consultation. Conservative measures are currently in place and the patient remains hemodynamically stable.   Assessment & Plan: Active Problems:   Diverticulitis  Acute sigmoid diverticulitis with possible microperforation: First episode of diverticulitis.  - Continue antibiotics, narrow modestly to CTX/flagyl - Continue liquid diet and IVF pending surgery recommendations.  - Continue analgesics as ordered - Will need outpatient GI for first colonoscopy.   T2DM: Very well controlled with HbA1c 5.9%  - Continue SSI   Migraine:  - Continue patient-supplied atogepant  Morbid obesity: Estimated body mass index is 46 kg/m as calculated from the following:   Height as of this encounter: 5\' 6"  (1.676 m).   Weight as of this encounter: 129.3 kg.  DVT prophylaxis: Lovenox Code Status: Full Family Communication: None at bedside Disposition Plan:  Status is: Inpatient  Remains inpatient appropriate because:IV treatments appropriate due to intensity of illness or inability to take PO  Dispo: The patient is from: Home              Anticipated d/c is to: Home              Patient currently is not medically stable to d/c.   Difficult to place patient No  Consultants:  General surgery  Procedures:  None  Antimicrobials: Cefepime, flagyl 7/10 - 7/11 Ceftriaxone, flagyl 7/11 >>   Subjective: Starting to have bloating discomfort in epigastrium. LLQ crampy pain is improved from admission, improved with vicodin. No fever or chest pain reported. Walking.  Objective: Vitals:    03/14/21 1444 03/14/21 1842 03/14/21 2241 03/15/21 0301  BP: 106/69 117/64 127/72 105/68  Pulse: 64 71 78 60  Resp:  18 20 16   Temp: 98 F (36.7 C) 99 F (37.2 C) 98.6 F (37 C) 99.2 F (37.3 C)  TempSrc: Oral  Oral Oral  SpO2: 100% 100% 95% 96%  Weight:      Height:        Intake/Output Summary (Last 24 hours) at 03/15/2021 1506 Last data filed at 03/15/2021 1303 Gross per 24 hour  Intake 1711.08 ml  Output --  Net 1711.08 ml   Filed Weights   03/14/21 0909  Weight: 129.3 kg    Gen: 50 y.o. male in no distress Pulm: Non-labored breathing room air. Clear to auscultation bilaterally.  CV: Regular rate and rhythm. No murmur, rub, or gallop. No JVD, no pedal edema. GI: Abdomen soft, tender to deep palpation in LLQ without rebound or guarding. Elsewhere non-tender, non-distended, with normoactive bowel sounds. No organomegaly or masses felt. Ext: Warm, no deformities Skin: No rashes, lesions or ulcers on visualized skin. Neuro: Alert and oriented. No focal neurological deficits. Psych: Judgement and insight appear normal. Mood & affect appropriate.   Data Reviewed: I have personally reviewed following labs and imaging studies  CBC: Recent Labs  Lab 03/14/21 0932 03/15/21 0446  WBC 11.8* 9.1  NEUTROABS 9.2*  --   HGB 13.8 13.0  HCT 40.2 39.1  MCV 81.0 82.5  PLT 215 160   Basic Metabolic Panel: Recent Labs  Lab 03/14/21 0932 03/15/21 0446  NA 137 137  K 3.7  3.5  CL 104 104  CO2 25 25  GLUCOSE 100* 100*  BUN 14 10  CREATININE 0.71 0.68  CALCIUM 8.6* 8.1*   GFR: Estimated Creatinine Clearance: 142.2 mL/min (by C-G formula based on SCr of 0.68 mg/dL). Liver Function Tests: Recent Labs  Lab 03/14/21 0932 03/15/21 0446  AST 22 15  ALT 32 29  ALKPHOS 93 83  BILITOT 0.7 1.1  PROT 7.1 6.3*  ALBUMIN 3.8 3.7   No results for input(s): LIPASE, AMYLASE in the last 168 hours. No results for input(s): AMMONIA in the last 168 hours. Coagulation Profile: No  results for input(s): INR, PROTIME in the last 168 hours. Cardiac Enzymes: No results for input(s): CKTOTAL, CKMB, CKMBINDEX, TROPONINI in the last 168 hours. BNP (last 3 results) No results for input(s): PROBNP in the last 8760 hours. HbA1C: Recent Labs    03/15/21 0446  HGBA1C 5.9*   CBG: Recent Labs  Lab 03/14/21 1654 03/14/21 2047 03/15/21 0758 03/15/21 1220  GLUCAP 102* 99 97 89   Lipid Profile: No results for input(s): CHOL, HDL, LDLCALC, TRIG, CHOLHDL, LDLDIRECT in the last 72 hours. Thyroid Function Tests: No results for input(s): TSH, T4TOTAL, FREET4, T3FREE, THYROIDAB in the last 72 hours. Anemia Panel: No results for input(s): VITAMINB12, FOLATE, FERRITIN, TIBC, IRON, RETICCTPCT in the last 72 hours. Urine analysis:    Component Value Date/Time   COLORURINE YELLOW 03/14/2021 0934   APPEARANCEUR CLEAR 03/14/2021 0934   LABSPEC >1.030 (H) 03/14/2021 0934   PHURINE 5.5 03/14/2021 0934   GLUCOSEU NEGATIVE 03/14/2021 0934   HGBUR NEGATIVE 03/14/2021 0934   BILIRUBINUR NEGATIVE 03/14/2021 0934   BILIRUBINUR negative 06/09/2020 1718   KETONESUR NEGATIVE 03/14/2021 0934   PROTEINUR NEGATIVE 03/14/2021 0934   UROBILINOGEN 0.2 06/09/2020 1718   NITRITE NEGATIVE 03/14/2021 0934   LEUKOCYTESUR TRACE (A) 03/14/2021 0934   Recent Results (from the past 240 hour(s))  Resp Panel by RT-PCR (Flu A&B, Covid) Nasopharyngeal Swab     Status: None   Collection Time: 03/14/21 11:42 AM   Specimen: Nasopharyngeal Swab; Nasopharyngeal(NP) swabs in vial transport medium  Result Value Ref Range Status   SARS Coronavirus 2 by RT PCR NEGATIVE NEGATIVE Final    Comment: (NOTE) SARS-CoV-2 target nucleic acids are NOT DETECTED.  The SARS-CoV-2 RNA is generally detectable in upper respiratory specimens during the acute phase of infection. The lowest concentration of SARS-CoV-2 viral copies this assay can detect is 138 copies/mL. A negative result does not preclude  SARS-Cov-2 infection and should not be used as the sole basis for treatment or other patient management decisions. A negative result may occur with  improper specimen collection/handling, submission of specimen other than nasopharyngeal swab, presence of viral mutation(s) within the areas targeted by this assay, and inadequate number of viral copies(<138 copies/mL). A negative result must be combined with clinical observations, patient history, and epidemiological information. The expected result is Negative.  Fact Sheet for Patients:  EntrepreneurPulse.com.au  Fact Sheet for Healthcare Providers:  IncredibleEmployment.be  This test is no t yet approved or cleared by the [place] FDA and  has been authorized for detection and/or diagnosis of SARS-CoV-2 by FDA under an Emergency Use Authorization (EUA). This EUA will remain  in effect (meaning this test can be used) for the duration of the COVID-19 declaration under Section 564(b)(1) of the Act, 21 U.S.C.section 360bbb-3(b)(1), unless the authorization is terminated  or revoked sooner.       Influenza A by PCR NEGATIVE NEGATIVE Final  Influenza B by PCR NEGATIVE NEGATIVE Final    Comment: (NOTE) The Xpert Xpress SARS-CoV-2/FLU/RSV plus assay is intended as an aid in the diagnosis of influenza from Nasopharyngeal swab specimens and should not be used as a sole basis for treatment. Nasal washings and aspirates are unacceptable for Xpert Xpress SARS-CoV-2/FLU/RSV testing.  Fact Sheet for Patients: EntrepreneurPulse.com.au  Fact Sheet for Healthcare Providers: IncredibleEmployment.be  This test is not yet approved or cleared by the [place] FDA and has been authorized for detection and/or diagnosis of SARS-CoV-2 by FDA under an Emergency Use Authorization (EUA). This EUA will remain in effect (meaning this test can be used) for the duration of  the COVID-19 declaration under Section 564(b)(1) of the Act, 21 U.S.C. section 360bbb-3(b)(1), unless the authorization is terminated or revoked.  Performed at [facility], [address], [place], [place] [address]       Radiology Studies: CT ABDOMEN PELVIS W CONTRAST  Result Date: 03/14/2021 CLINICAL DATA:  Lower abdominal pain and pressure several days. Possible diverticulitis. EXAM: CT ABDOMEN AND PELVIS WITH CONTRAST TECHNIQUE: Multidetector CT imaging of the abdomen and pelvis was performed using the standard protocol following bolus administration of intravenous contrast. CONTRAST:  152mL OMNIPAQUE IOHEXOL 300 MG/ML  SOLN COMPARISON:  10/25/2015 FINDINGS: Lower chest: Lung bases are clear. Hepatobiliary: Prior cholecystectomy. 8 mm calcification projects over the common bile duct. No significant intrahepatic ductal dilatation. Pancreas: Normal. Spleen: Normal. Adrenals/Urinary Tract: Adrenal glands are normal. Kidneys are normal in size and demonstrate several punctate bilateral stones. No evidence of hydronephrosis. No focal mass. Ureters and bladder are normal. Stomach/Bowel: Stomach and small bowel are normal. Appendix is normal. There is diverticulosis of the colon most prominent over the sigmoid colon. There is moderate adjacent inflammatory change over the sigmoid colon in the midline pelvis with minimal adjacent free fluid as findings are compatible with acute diverticulitis. No evidence of free peritoneal air. Small fluid collection abutting the inflamed diverticula likely phlegmon, although may represent evolving diverticular abscess. Vascular/Lymphatic: Abdominal aorta is normal. No significant adenopathy. Reproductive: Normal. Other: None. Musculoskeletal: No focal abnormality. IMPRESSION: 1. Colonic diverticulosis with evidence of acute diverticulitis involving the sigmoid colon in the midline pelvis. Small fluid collection abutting the inflamed diverticula likely  phlegmon, although may represent evolving diverticular abscess. No evidence of free peritoneal air. 2. Bilateral nephrolithiasis. 3. 8 mm stone over the common bile duct without significant intrahepatic ductal dilatation. Prior cholecystectomy. Electronically Signed   By: Marin Olp M.D.   On: 03/14/2021 10:59    Scheduled Meds:  aspirin EC  81 mg Oral Once per day on Mon Tue Wed Thu Fri   Atogepant  60 mg Oral QHS   enoxaparin (LOVENOX) injection  60 mg Subcutaneous Q24H   insulin aspart  0-15 Units Subcutaneous TID WC   Continuous Infusions:  sodium chloride 75 mL/hr at 03/15/21 0557   ceFEPime (MAXIPIME) IV 2 g (03/15/21 1311)   metronidazole 500 mg (03/15/21 1345)     LOS: 1 day   Time spent: 25 minutes.  Patrecia Pour, MD [facility] [email or web address] 03/15/2021, 3:06 PM

## 2021-03-16 LAB — GLUCOSE, CAPILLARY
Glucose-Capillary: 97 mg/dL (ref 70–99)
Glucose-Capillary: 97 mg/dL (ref 70–99)
Glucose-Capillary: 115 mg/dL — ABNORMAL HIGH (ref 70–99)
Glucose-Capillary: 107 mg/dL — ABNORMAL HIGH (ref 70–99)

## 2021-03-16 NOTE — Progress Notes (Signed)
PROGRESS NOTE  Roberto Charles  BSW:[id number] DOB: 11-05-1970 DOA: 03/14/2021 PCP: Luetta Nutting, DO   Brief Narrative: Roberto Charles is a 50 y.o. male with a history of obesity and T2DM who presented to the ED with about a week of LLQ abdominal discomfort. CT abd/pelvis revealed sigmoid diverticulitis with possible microperforation for which cefepime and flagyl were started and patient admitted with surgery consultation. Conservative measures are currently in place and the patient remains hemodynamically stable.   Assessment & Plan: Active Problems:   Diverticulitis  Acute sigmoid diverticulitis with possible microperforation: First episode of diverticulitis.  - Continue CTX/flagyl, WBC normalized. - Continue liquid diet and IVF for now, perhaps advance to fulls in next 24 hours.  - Continue analgesics as ordered - Will need outpatient GI for first colonoscopy.   T2DM: Very well controlled with HbA1c 5.9%  - Continue SSI   Migraine:  - Continue patient-supplied atogepant  Morbid obesity: Estimated body mass index is 46 kg/m as calculated from the following:   Height as of this encounter: 5\' 6"  (1.676 m).   Weight as of this encounter: 129.3 kg.  DVT prophylaxis: Lovenox Code Status: Full Family Communication: None at bedside Disposition Plan:  Status is: Inpatient  Remains inpatient appropriate because:IV treatments appropriate due to intensity of illness or inability to take PO  Dispo: The patient is from: Home              Anticipated d/c is to: Home              Patient currently is not medically stable to d/c.   Difficult to place patient No  Consultants:  General surgery  Procedures:  None  Antimicrobials: Cefepime, flagyl 7/10 - 7/11 Ceftriaxone, flagyl 7/11 >>   Subjective: LLQ pain is modestly improved but still significant enough to require opioids. Bloating in epigastrium is noted. No fever. No vomiting.   Objective: Vitals:   03/15/21 2151  03/16/21 0512 03/16/21 0512 03/16/21 1425  BP: 112/76 111/67 111/67 112/61  Pulse: 62 (!) 54 (!) 56 (!) 57  Resp: 16 16 16 14   Temp: 98.4 F (36.9 C) (!) 97.5 F (36.4 C) (!) 97.5 F (36.4 C) 98.2 F (36.8 C)  TempSrc:  Oral Oral Oral  SpO2: 98% 97% 98% 96%  Weight:      Height:        Intake/Output Summary (Last 24 hours) at 03/16/2021 1751 Last data filed at 03/16/2021 1429 Gross per 24 hour  Intake 2204.87 ml  Output 400 ml  Net 1804.87 ml   Filed Weights   03/14/21 0909  Weight: 129.3 kg   Gen: 50 y.o. male in no distress Pulm: Nonlabored breathing room air. Clear. CV: Regular rate and rhythm. No murmur, rub, or gallop. No JVD, no dependent edema. GI: Abdomen soft, obese, tender in medial LLQ without rebound. Epigastric discomfort generally but elsewhere non-tender, non-distended, with normoactive bowel sounds.  Ext: Warm, no deformities Skin: No rashes, lesions or ulcers on visualized skin. Neuro: Alert and oriented. No focal neurological deficits. Psych: Judgement and insight appear fair. Mood euthymic & affect congruent. Behavior is appropriate.    Data Reviewed: I have personally reviewed following labs and imaging studies  CBC: Recent Labs  Lab 03/14/21 0932 03/15/21 0446  WBC 11.8* 9.1  NEUTROABS 9.2*  --   HGB 13.8 13.0  HCT 40.2 39.1  MCV 81.0 82.5  PLT 215 466   Basic Metabolic Panel: Recent Labs  Lab 03/14/21 0932 03/15/21 0446  NA 137 137  K 3.7 3.5  CL 104 104  CO2 25 25  GLUCOSE 100* 100*  BUN 14 10  CREATININE 0.71 0.68  CALCIUM 8.6* 8.1*   GFR: Estimated Creatinine Clearance: 142.2 mL/min (by C-G formula based on SCr of 0.68 mg/dL). Liver Function Tests: Recent Labs  Lab 03/14/21 0932 03/15/21 0446  AST 22 15  ALT 32 29  ALKPHOS 93 83  BILITOT 0.7 1.1  PROT 7.1 6.3*  ALBUMIN 3.8 3.7   No results for input(s): LIPASE, AMYLASE in the last 168 hours. No results for input(s): AMMONIA in the last 168 hours. Coagulation  Profile: No results for input(s): INR, PROTIME in the last 168 hours. Cardiac Enzymes: No results for input(s): CKTOTAL, CKMB, CKMBINDEX, TROPONINI in the last 168 hours. BNP (last 3 results) No results for input(s): PROBNP in the last 8760 hours. HbA1C: Recent Labs    03/15/21 0446  HGBA1C 5.9*   CBG: Recent Labs  Lab 03/15/21 1718 03/15/21 2155 03/16/21 0755 03/16/21 1157 03/16/21 1648  GLUCAP 127* 101* 97 97 115*   Lipid Profile: No results for input(s): CHOL, HDL, LDLCALC, TRIG, CHOLHDL, LDLDIRECT in the last 72 hours. Thyroid Function Tests: No results for input(s): TSH, T4TOTAL, FREET4, T3FREE, THYROIDAB in the last 72 hours. Anemia Panel: No results for input(s): VITAMINB12, FOLATE, FERRITIN, TIBC, IRON, RETICCTPCT in the last 72 hours. Urine analysis:    Component Value Date/Time   COLORURINE YELLOW 03/14/2021 0934   APPEARANCEUR CLEAR 03/14/2021 0934   LABSPEC >1.030 (H) 03/14/2021 0934   PHURINE 5.5 03/14/2021 0934   GLUCOSEU NEGATIVE 03/14/2021 0934   HGBUR NEGATIVE 03/14/2021 0934   BILIRUBINUR NEGATIVE 03/14/2021 0934   BILIRUBINUR negative 06/09/2020 1718   KETONESUR NEGATIVE 03/14/2021 0934   PROTEINUR NEGATIVE 03/14/2021 0934   UROBILINOGEN 0.2 06/09/2020 1718   NITRITE NEGATIVE 03/14/2021 0934   LEUKOCYTESUR TRACE (A) 03/14/2021 0934   Recent Results (from the past 240 hour(s))  Resp Panel by RT-PCR (Flu A&B, Covid) Nasopharyngeal Swab     Status: None   Collection Time: 03/14/21 11:42 AM   Specimen: Nasopharyngeal Swab; Nasopharyngeal(NP) swabs in vial transport medium  Result Value Ref Range Status   SARS Coronavirus 2 by RT PCR NEGATIVE NEGATIVE Final    Comment: (NOTE) SARS-CoV-2 target nucleic acids are NOT DETECTED.  The SARS-CoV-2 RNA is generally detectable in upper respiratory specimens during the acute phase of infection. The lowest concentration of SARS-CoV-2 viral copies this assay can detect is 138 copies/mL. A negative result  does not preclude SARS-Cov-2 infection and should not be used as the sole basis for treatment or other patient management decisions. A negative result may occur with  improper specimen collection/handling, submission of specimen other than nasopharyngeal swab, presence of viral mutation(s) within the areas targeted by this assay, and inadequate number of viral copies(<138 copies/mL). A negative result must be combined with clinical observations, patient history, and epidemiological information. The expected result is Negative.  Fact Sheet for Patients:  EntrepreneurPulse.com.au  Fact Sheet for Healthcare Providers:  IncredibleEmployment.be  This test is no t yet approved or cleared by the [place] FDA and  has been authorized for detection and/or diagnosis of SARS-CoV-2 by FDA under an Emergency Use Authorization (EUA). This EUA will remain  in effect (meaning this test can be used) for the duration of the COVID-19 declaration under Section 564(b)(1) of the Act, 21 U.S.C.section 360bbb-3(b)(1), unless the authorization is terminated  or revoked sooner.  Influenza A by PCR NEGATIVE NEGATIVE Final   Influenza B by PCR NEGATIVE NEGATIVE Final    Comment: (NOTE) The Xpert Xpress SARS-CoV-2/FLU/RSV plus assay is intended as an aid in the diagnosis of influenza from Nasopharyngeal swab specimens and should not be used as a sole basis for treatment. Nasal washings and aspirates are unacceptable for Xpert Xpress SARS-CoV-2/FLU/RSV testing.  Fact Sheet for Patients: EntrepreneurPulse.com.au  Fact Sheet for Healthcare Providers: IncredibleEmployment.be  This test is not yet approved or cleared by the [place] FDA and has been authorized for detection and/or diagnosis of SARS-CoV-2 by FDA under an Emergency Use Authorization (EUA). This EUA will remain in effect (meaning this test can be used) for  the duration of the COVID-19 declaration under Section 564(b)(1) of the Act, 21 U.S.C. section 360bbb-3(b)(1), unless the authorization is terminated or revoked.  Performed at [facility], [address], [place], [place] [address]       Radiology Studies: No results found.  Scheduled Meds:  aspirin EC  81 mg Oral Once per day on Mon Tue Wed Thu Fri   Atogepant  60 mg Oral QHS   enoxaparin (LOVENOX) injection  60 mg Subcutaneous Q24H   insulin aspart  0-15 Units Subcutaneous TID WC   Continuous Infusions:  sodium chloride 75 mL/hr at 03/15/21 2037   cefTRIAXone (ROCEPHIN)  IV 2 g (03/15/21 2039)   metronidazole 500 mg (03/16/21 1408)     LOS: 2 days   Time spent: 25 minutes.  Patrecia Pour, MD [facility] [email or web address] 03/16/2021, 5:51 PM

## 2021-03-16 NOTE — Plan of Care (Signed)
?  Problem: Clinical Measurements: ?Goal: Will remain free from infection ?Outcome: Progressing ?  ?

## 2021-03-16 NOTE — Progress Notes (Signed)
    YJ:[id number] pain  Subjective: Complains of bloating and pressure in the colon.  The pain is actually a little better, but still present.  He has been up walking and no flatus or BM so far.    Objective: Vital signs in last 24 hours: Temp:  [97.5 F (36.4 C)-98.4 F (36.9 C)] 97.5 F (36.4 C) (07/12 0512) Pulse Rate:  [54-62] 56 (07/12 0512) Resp:  [16] 16 (07/12 0512) BP: (111-112)/(67-76) 111/67 (07/12 0512) SpO2:  [97 %-98 %] 98 % (07/12 0512) Last BM Date: 03/13/21 800 Po 400 IV Urine 600 recorded Afebrile, VSS No labs/radiology today  Intake/Output from previous day: 07/11 0701 - 07/12 0700 In: 2454.9 [P.O.:800; I.V.:1254.9; IV Piggyback:400] Out: 600 [Urine:600] Intake/Output this shift: No intake/output data recorded.  General appearance: alert, cooperative, and no distress Resp: clear to auscultation bilaterally GI: soft, still tender, but less than yesterday in the LLQ  Lab Results:  Recent Labs    03/14/21 0932 03/15/21 0446  WBC 11.8* 9.1  HGB 13.8 13.0  HCT 40.2 39.1  PLT 215 184    BMET Recent Labs    03/14/21 0932 03/15/21 0446  NA 137 137  K 3.7 3.5  CL 104 104  CO2 25 25  GLUCOSE 100* 100*  BUN 14 10  CREATININE 0.71 0.68  CALCIUM 8.6* 8.1*   PT/INR No results for input(s): LABPROT, INR in the last 72 hours.  Recent Labs  Lab 03/14/21 0932 03/15/21 0446  AST 22 15  ALT 32 29  ALKPHOS 93 83  BILITOT 0.7 1.1  PROT 7.1 6.3*  ALBUMIN 3.8 3.7     Lipase  No results found for: LIPASE   Medications:  aspirin EC  81 mg Oral Once per day on Mon Tue Wed Thu Fri   Atogepant  60 mg Oral QHS   enoxaparin (LOVENOX) injection  60 mg Subcutaneous Q24H   insulin aspart  0-15 Units Subcutaneous TID WC    Assessment/Plan Sigmoid diverticulitis with possible microperforation   FEN: clears/IV fluids VTE:  Lovenox Abx:  cefepime/metronidazole 7/10 >> day 3   Sleep apnea with BiPAP   Plan:  continue current Rx.  Recheck labs  in AM.       LOS: 2 days    Mickel Schreur 03/16/2021 Please see Amion

## 2021-03-17 LAB — CBC
HCT: 38.4 % — ABNORMAL LOW (ref 39.0–52.0)
Hemoglobin: 12.8 g/dL — ABNORMAL LOW (ref 13.0–17.0)
MCH: 27.8 pg (ref 26.0–34.0)
MCHC: 33.3 g/dL (ref 30.0–36.0)
MCV: 83.5 fL (ref 80.0–100.0)
Platelets: 181 10*3/uL (ref 150–400)
RBC: 4.6 MIL/uL (ref 4.22–5.81)
RDW: 14.5 % (ref 11.5–15.5)
WBC: 6.7 10*3/uL (ref 4.0–10.5)
nRBC: 0 % (ref 0.0–0.2)

## 2021-03-17 LAB — GLUCOSE, CAPILLARY
Glucose-Capillary: 78 mg/dL (ref 70–99)
Glucose-Capillary: 102 mg/dL — ABNORMAL HIGH (ref 70–99)
Glucose-Capillary: 106 mg/dL — ABNORMAL HIGH (ref 70–99)
Glucose-Capillary: 80 mg/dL (ref 70–99)

## 2021-03-17 LAB — BASIC METABOLIC PANEL
Anion gap: 7 (ref 5–15)
BUN: 8 mg/dL (ref 6–20)
CO2: 23 mmol/L (ref 22–32)
Calcium: 8.1 mg/dL — ABNORMAL LOW (ref 8.9–10.3)
Chloride: 107 mmol/L (ref 98–111)
Creatinine, Ser: 0.57 mg/dL — ABNORMAL LOW (ref 0.61–1.24)
GFR, Estimated: >60 mL/min (ref >60)
Glucose, Bld: 90 mg/dL (ref 70–99)
Potassium: 3.7 mmol/L (ref 3.5–5.1)
Sodium: 137 mmol/L (ref 135–145)

## 2021-03-17 NOTE — Progress Notes (Signed)
    CC: Abdominal pain  Subjective: He is doing much better this a.m.  Pain is markedly improved.  He is tolerating clear liquids and is had a small BM.  Objective: Vital signs in last 24 hours: Temp:  [98.2 F (36.8 C)-98.7 F (37.1 C)] 98.7 F (37.1 C) (07/13 0339) Pulse Rate:  [52-57] 52 (07/13 0339) Resp:  [14-18] 18 (07/13 0339) BP: (99-112)/(61-63) 99/63 (07/13 0339) SpO2:  [96 %-100 %] 100 % (07/13 0339) Last BM Date: 03/16/21 250 p.o. recorded 900 IV BM x1 Afebrile vital signs are stable Creatinine 0.57 WBC 11.8>> 9.1>> 6.7   Intake/Output from previous day: 07/12 0701 - 07/13 0700 In: 1136.1 [P.O.:250; I.V.:686.1; IV Piggyback:200] Out: -  Intake/Output this shift: No intake/output data recorded.  General appearance: alert, cooperative, and no distress Resp: clear to auscultation bilaterally GI: Soft, tenderness in the left lower quadrants markedly improved.  No distention, positive BM.  Lab Results:  Recent Labs    03/15/21 0446 03/17/21 0455  WBC 9.1 6.7  HGB 13.0 12.8*  HCT 39.1 38.4*  PLT 184 181    BMET Recent Labs    03/15/21 0446 03/17/21 0455  NA 137 137  K 3.5 3.7  CL 104 107  CO2 25 23  GLUCOSE 100* 90  BUN 10 8  CREATININE 0.68 0.57*  CALCIUM 8.1* 8.1*   PT/INR No results for input(s): LABPROT, INR in the last 72 hours.  Recent Labs  Lab 03/14/21 0932 03/15/21 0446  AST 22 15  ALT 32 29  ALKPHOS 93 83  BILITOT 0.7 1.1  PROT 7.1 6.3*  ALBUMIN 3.8 3.7     Lipase  No results found for: LIPASE   Medications:  aspirin EC  81 mg Oral Once per day on Mon Tue Wed Thu Fri   Atogepant  60 mg Oral QHS   enoxaparin (LOVENOX) injection  60 mg Subcutaneous Q24H   insulin aspart  0-15 Units Subcutaneous TID WC    Assessment/Plan Sigmoid diverticulitis with possible microperforation   FEN: clears/IV fluids VTE:  Lovenox Abx:  cefepime/metronidazole 7/10 >> day 3   Sleep apnea with BiPAP Migraines  Plan: Advance his  diet, if he does well transition to p.o. antibiotics and possible discharge tomorrow.  He will need to follow-up with GI and a colonoscopy in approximately 6 weeks.       LOS: 3 days    Roberto Charles 03/17/2021 Please see Amion

## 2021-03-17 NOTE — Progress Notes (Signed)
Patient ID: Roberto Charles, male   DOB: 1970-10-11, 50 y.o.   MRN: [id number]  PROGRESS NOTE    Keyron Pokorski  RDE:[id number] DOB: September 04, 1971 DOA: 03/14/2021 PCP: Luetta Nutting, DO   Brief Narrative:  50 year old male with history of diabetes mellitus type 2 and obesity presented with left lower quadrant abdominal discomfort.  On presentation, CT of the abdomen/pelvis showed sigmoid diverticulitis with possible microperforation.  He was started on IV antibiotics.  General surgery was consulted.  Assessment & Plan:   Acute sigmoid diverticulitis with possible microperforation  -First episode of diverticulitis. -General surgery following.  Diet advancement as per general surgery.  Currently hemodynamically stable and pain is improving. -Continue IV antibiotics for now.  Possible switch to oral antibiotics in the next 24 hours if cleared by general surgery.  Will need outpatient GI evaluation for colonoscopy -Continue pain management and IV fluids  Diabetes mellitus type 2 -Controlled.  A1c 5.9.  Continue SSI  Migraine -Continue patient supplied at atogepant  Morbid obesity -Outpatient follow-up   DVT prophylaxis: Lovenox Code Status: Full Family Communication: None at bedside Disposition Plan: Status is: Inpatient  Remains inpatient appropriate because:Inpatient level of care appropriate due to severity of illness  Dispo: The patient is from: Home              Anticipated d/c is to: Home              Patient currently is not medically stable to d/c.   Difficult to place patient No  Consultants: General surgery  Procedures: None  Antimicrobials: Rocephin and Flagyl from 03/15/2021 onwards.  1 dose of cefepime on 03/14/2021   Subjective: Patient seen and examined at bedside.  Tolerating liquid diet.  Complains of intermittent lower quadrant abdominal pain but much improved.  Denies overnight fever or shortness of breath.  Objective: Vitals:   03/16/21 0512 03/16/21  0512 03/16/21 1425 03/17/21 0339  BP: 111/67 111/67 112/61 99/63  Pulse: (!) 54 (!) 56 (!) 57 (!) 52  Resp: 16 16 14 18   Temp: (!) 97.5 F (36.4 C) (!) 97.5 F (36.4 C) 98.2 F (36.8 C) 98.7 F (37.1 C)  TempSrc: Oral Oral Oral Oral  SpO2: 97% 98% 96% 100%  Weight:      Height:        Intake/Output Summary (Last 24 hours) at 03/17/2021 1052 Last data filed at 03/17/2021 0600 Gross per 24 hour  Intake 1136.08 ml  Output --  Net 1136.08 ml   Filed Weights   03/14/21 0909  Weight: 129.3 kg    Examination:  General exam: Appears calm and comfortable.  Currently on room air. Respiratory system: Bilateral decreased breath sounds at bases Cardiovascular system: S1 & S2 heard, Rate controlled Gastrointestinal system: Abdomen is morbidly obese, nondistended, soft and mildly tender in the left lower quadrant.  Normal bowel sounds heard. Extremities: No cyanosis, clubbing; trace lower extremity edema   Data Reviewed: I have personally reviewed following labs and imaging studies  CBC: Recent Labs  Lab 03/14/21 0932 03/15/21 0446 03/17/21 0455  WBC 11.8* 9.1 6.7  NEUTROABS 9.2*  --   --   HGB 13.8 13.0 12.8*  HCT 40.2 39.1 38.4*  MCV 81.0 82.5 83.5  PLT 215 184 631   Basic Metabolic Panel: Recent Labs  Lab 03/14/21 0932 03/15/21 0446 03/17/21 0455  NA 137 137 137  K 3.7 3.5 3.7  CL 104 104 107  CO2 25 25 23   GLUCOSE 100* 100* 90  BUN  14 10 8   CREATININE 0.71 0.68 0.57*  CALCIUM 8.6* 8.1* 8.1*   GFR: Estimated Creatinine Clearance: 142.2 mL/min (A) (by C-G formula based on SCr of 0.57 mg/dL (L)). Liver Function Tests: Recent Labs  Lab 03/14/21 0932 03/15/21 0446  AST 22 15  ALT 32 29  ALKPHOS 93 83  BILITOT 0.7 1.1  PROT 7.1 6.3*  ALBUMIN 3.8 3.7   No results for input(s): LIPASE, AMYLASE in the last 168 hours. No results for input(s): AMMONIA in the last 168 hours. Coagulation Profile: No results for input(s): INR, PROTIME in the last 168  hours. Cardiac Enzymes: No results for input(s): CKTOTAL, CKMB, CKMBINDEX, TROPONINI in the last 168 hours. BNP (last 3 results) No results for input(s): PROBNP in the last 8760 hours. HbA1C: Recent Labs    03/15/21 0446  HGBA1C 5.9*   CBG: Recent Labs  Lab 03/16/21 0755 03/16/21 1157 03/16/21 1648 03/16/21 2204 03/17/21 0750  GLUCAP 97 97 115* 107* 78   Lipid Profile: No results for input(s): CHOL, HDL, LDLCALC, TRIG, CHOLHDL, LDLDIRECT in the last 72 hours. Thyroid Function Tests: No results for input(s): TSH, T4TOTAL, FREET4, T3FREE, THYROIDAB in the last 72 hours. Anemia Panel: No results for input(s): VITAMINB12, FOLATE, FERRITIN, TIBC, IRON, RETICCTPCT in the last 72 hours. Sepsis Labs: Recent Labs  Lab 03/14/21 1142  LATICACIDVEN 1.1    Recent Results (from the past 240 hour(s))  Resp Panel by RT-PCR (Flu A&B, Covid) Nasopharyngeal Swab     Status: None   Collection Time: 03/14/21 11:42 AM   Specimen: Nasopharyngeal Swab; Nasopharyngeal(NP) swabs in vial transport medium  Result Value Ref Range Status   SARS Coronavirus 2 by RT PCR NEGATIVE NEGATIVE Final    Comment: (NOTE) SARS-CoV-2 target nucleic acids are NOT DETECTED.  The SARS-CoV-2 RNA is generally detectable in upper respiratory specimens during the acute phase of infection. The lowest concentration of SARS-CoV-2 viral copies this assay can detect is 138 copies/mL. A negative result does not preclude SARS-Cov-2 infection and should not be used as the sole basis for treatment or other patient management decisions. A negative result may occur with  improper specimen collection/handling, submission of specimen other than nasopharyngeal swab, presence of viral mutation(s) within the areas targeted by this assay, and inadequate number of viral copies(<138 copies/mL). A negative result must be combined with clinical observations, patient history, and epidemiological information. The expected result is  Negative.  Fact Sheet for Patients:  EntrepreneurPulse.com.au  Fact Sheet for Healthcare Providers:  IncredibleEmployment.be  This test is no t yet approved or cleared by the [place] FDA and  has been authorized for detection and/or diagnosis of SARS-CoV-2 by FDA under an Emergency Use Authorization (EUA). This EUA will remain  in effect (meaning this test can be used) for the duration of the COVID-19 declaration under Section 564(b)(1) of the Act, 21 U.S.C.section 360bbb-3(b)(1), unless the authorization is terminated  or revoked sooner.       Influenza A by PCR NEGATIVE NEGATIVE Final   Influenza B by PCR NEGATIVE NEGATIVE Final    Comment: (NOTE) The Xpert Xpress SARS-CoV-2/FLU/RSV plus assay is intended as an aid in the diagnosis of influenza from Nasopharyngeal swab specimens and should not be used as a sole basis for treatment. Nasal washings and aspirates are unacceptable for Xpert Xpress SARS-CoV-2/FLU/RSV testing.  Fact Sheet for Patients: EntrepreneurPulse.com.au  Fact Sheet for Healthcare Providers: IncredibleEmployment.be  This test is not yet approved or cleared by the [place] FDA and has  been authorized for detection and/or diagnosis of SARS-CoV-2 by FDA under an Emergency Use Authorization (EUA). This EUA will remain in effect (meaning this test can be used) for the duration of the COVID-19 declaration under Section 564(b)(1) of the Act, 21 U.S.C. section 360bbb-3(b)(1), unless the authorization is terminated or revoked.  Performed at [facility], [address], [place],  [address]          Radiology Studies: No results found.      Scheduled Meds:  aspirin EC  81 mg Oral Once per day on Mon Tue Wed Thu Fri   Atogepant  60 mg Oral QHS   enoxaparin (LOVENOX) injection  60 mg Subcutaneous Q24H   insulin aspart  0-15 Units Subcutaneous TID WC    Continuous Infusions:  sodium chloride 75 mL/hr at 03/17/21 0436   cefTRIAXone (ROCEPHIN)  IV Stopped (03/16/21 2021)   metronidazole 500 mg (03/17/21 0521)          Aline August, MD [facility] 03/17/2021, 10:52 AM

## 2021-03-18 LAB — BASIC METABOLIC PANEL
Anion gap: 10 (ref 5–15)
BUN: 7 mg/dL (ref 6–20)
CO2: 23 mmol/L (ref 22–32)
Calcium: 8.4 mg/dL — ABNORMAL LOW (ref 8.9–10.3)
Chloride: 105 mmol/L (ref 98–111)
Creatinine, Ser: 0.61 mg/dL (ref 0.61–1.24)
GFR, Estimated: >60 mL/min (ref >60)
Glucose, Bld: 95 mg/dL (ref 70–99)
Potassium: 3.6 mmol/L (ref 3.5–5.1)
Sodium: 138 mmol/L (ref 135–145)

## 2021-03-18 LAB — GLUCOSE, CAPILLARY
Glucose-Capillary: 100 mg/dL — ABNORMAL HIGH (ref 70–99)
Glucose-Capillary: 136 mg/dL — ABNORMAL HIGH (ref 70–99)
Glucose-Capillary: 146 mg/dL — ABNORMAL HIGH (ref 70–99)

## 2021-03-18 LAB — CBC WITH DIFFERENTIAL/PLATELET
Abs Immature Granulocytes: 0.05 10*3/uL (ref 0.00–0.07)
Basophils Absolute: 0 10*3/uL (ref 0.0–0.1)
Basophils Relative: 0 %
Eosinophils Absolute: 0.1 10*3/uL (ref 0.0–0.5)
Eosinophils Relative: 2 %
HCT: 40.2 % (ref 39.0–52.0)
Hemoglobin: 13.4 g/dL (ref 13.0–17.0)
Immature Granulocytes: 1 %
Lymphocytes Relative: 17 %
Lymphs Abs: 1.3 10*3/uL (ref 0.7–4.0)
MCH: 27.8 pg (ref 26.0–34.0)
MCHC: 33.3 g/dL (ref 30.0–36.0)
MCV: 83.4 fL (ref 80.0–100.0)
Monocytes Absolute: 0.6 10*3/uL (ref 0.1–1.0)
Monocytes Relative: 8 %
Neutro Abs: 5.5 10*3/uL (ref 1.7–7.7)
Neutrophils Relative %: 72 %
Platelets: 194 10*3/uL (ref 150–400)
RBC: 4.82 MIL/uL (ref 4.22–5.81)
RDW: 14.5 % (ref 11.5–15.5)
WBC: 7.5 10*3/uL (ref 4.0–10.5)
nRBC: 0 % (ref 0.0–0.2)

## 2021-03-18 LAB — C-REACTIVE PROTEIN: CRP: 3.2 mg/dL — ABNORMAL HIGH (ref <1.0)

## 2021-03-18 LAB — MAGNESIUM: Magnesium: 2.1 mg/dL (ref 1.7–2.4)

## 2021-03-18 MED ORDER — SACCHAROMYCES BOULARDII 250 MG PO CAPS: 250.0000 mg | ORAL_CAPSULE | Freq: Two times a day (BID) | ORAL | 0 refills | Status: DC

## 2021-03-18 MED ORDER — METRONIDAZOLE 500 MG PO TABS: 500.0000 mg | ORAL_TABLET | Freq: Three times a day (TID) | ORAL | 0 refills | Status: AC

## 2021-03-18 MED ORDER — ONDANSETRON HCL 4 MG PO TABS: 4.0000 mg | ORAL_TABLET | Freq: Three times a day (TID) | ORAL | 0 refills | Status: DC | PRN

## 2021-03-18 MED ORDER — CIPROFLOXACIN HCL 500 MG PO TABS
500.0000 mg | ORAL_TABLET | Freq: Two times a day (BID) | ORAL | Status: DC
  Administered 2021-03-18: 500 mg via ORAL
  Filled 2021-03-18: qty 1

## 2021-03-18 MED ORDER — SACCHAROMYCES BOULARDII 250 MG PO CAPS
250.0000 mg | ORAL_CAPSULE | Freq: Two times a day (BID) | ORAL | Status: DC
  Administered 2021-03-18: 250 mg via ORAL
  Filled 2021-03-18: qty 1

## 2021-03-18 MED ORDER — METRONIDAZOLE 500 MG PO TABS
500.0000 mg | ORAL_TABLET | Freq: Three times a day (TID) | ORAL | Status: DC
  Administered 2021-03-18: 500 mg via ORAL
  Filled 2021-03-18: qty 1

## 2021-03-18 MED ORDER — TRAMADOL HCL 50 MG PO TABS: 50.0000 mg | ORAL_TABLET | Freq: Four times a day (QID) | ORAL | 0 refills | Status: DC | PRN

## 2021-03-18 MED ORDER — CIPROFLOXACIN HCL 500 MG PO TABS: 500.0000 mg | ORAL_TABLET | Freq: Two times a day (BID) | ORAL | 0 refills | Status: AC

## 2021-03-18 NOTE — Progress Notes (Signed)
    CC: Abdominal pain  Subjective: Patient feels much better this a.m. no pain ready to start soft diet.  He has had 2 bowel movements first was somewhat constipated second was much looser.  He notes marked improvement in his abdominal symptoms.  No further issues with liquids.  Objective: Vital signs in last 24 hours: Temp:  [98 F (36.7 C)-98.6 F (37 C)] 98.6 F (37 C) (07/14 0343) Pulse Rate:  [53-62] 53 (07/14 0343) Resp:  [15-18] 18 (07/14 0343) BP: (112-119)/(70-79) 119/76 (07/14 0343) SpO2:  [96 %-98 %] 98 % (07/14 0343) Last BM Date: 03/17/21 P.o. not recorded 1127 IV No output recorded Afebrile vital signs are stable No labs this a.m.  Intake/Output from previous day: 07/13 0701 - 07/14 0700 In: 1127.9 [I.V.:1127.9] Out: -  Intake/Output this shift: No intake/output data recorded.  General appearance: alert, cooperative, and no distress Resp: clear to auscultation bilaterally GI: Soft, nontender positive bowel sounds.  Tolerating p.o.'s well.  Positive BM x2 yesterday.  Lab Results:  Recent Labs    03/17/21 0455 03/18/21 0507  WBC 6.7 7.5  HGB 12.8* 13.4  HCT 38.4* 40.2  PLT 181 194    BMET Recent Labs    03/17/21 0455 03/18/21 0507  NA 137 138  K 3.7 3.6  CL 107 105  CO2 23 23  GLUCOSE 90 95  BUN 8 7  CREATININE 0.57* 0.61  CALCIUM 8.1* 8.4*   PT/INR No results for input(s): LABPROT, INR in the last 72 hours.  Recent Labs  Lab 03/14/21 0932 03/15/21 0446  AST 22 15  ALT 32 29  ALKPHOS 93 83  BILITOT 0.7 1.1  PROT 7.1 6.3*  ALBUMIN 3.8 3.7     Lipase  No results found for: LIPASE   Medications:  aspirin EC  81 mg Oral Once per day on Mon Tue Wed Thu Fri   Atogepant  60 mg Oral QHS   enoxaparin (LOVENOX) injection  60 mg Subcutaneous Q24H   insulin aspart  0-15 Units Subcutaneous TID WC    Assessment/Plan Sigmoid diverticulitis with possible microperforation   FEN: full liquids/IV fluids VTE:  Lovenox Abx:   cefepime/metronidazole 7/10 >> day 4   Sleep apnea with BiPAP Migraines  Plan: I have advanced him to a soft diet.  We have changed him over to Cipro/Flagyl p.o. he has an amoxicillin allergy.  If he tolerates soft diet well he can go home on oral antibiotics.  He needs a total of 10 days of antibiotic therapy.  He will need to follow-up with his primary care physician and arrange a colonoscopy in 6 to 8 weeks.  I have added a probiotic and recommended he take 1 at home over the next month.      LOS: 4 days    Daphnie Venturini 03/18/2021 Please see Amion

## 2021-03-18 NOTE — Discharge Summary (Signed)
Physician Discharge Summary  Roberto Charles FGH:[id number] DOB: 29-May-1971 DOA: 03/14/2021  PCP: Luetta Nutting, DO  Admit date: 03/14/2021 Discharge date: 03/18/2021  Admitted From: Home Disposition: Home  Recommendations for Outpatient Follow-up:  Follow up with PCP in 1 week with repeat CBC/BMP Outpatient evaluation and follow-up by GI in 4 to 6 weeks for need for outpatient colonoscopy Outpatient follow-up with general surgery if needed Follow up in ED if symptoms worsen or new appear   Home Health: No Equipment/Devices: None  Discharge Condition: Stable CODE STATUS: Full Diet recommendation: Soft/carb modified diet  Brief/Interim Summary: 50 year old male with history of diabetes mellitus type 2 and obesity presented with left lower quadrant abdominal discomfort.  On presentation, CT of the abdomen/pelvis showed sigmoid diverticulitis with possible microperforation.  He was started on IV antibiotics.  General surgery was consulted.  During the hospitalization, his diet was gradually advanced.  He is tolerating diet and pain is improving.  If he tolerates solid diet today, he will be discharged home on oral ciprofloxacin and Flagyl to complete 10-day course of total therapy.  General surgery has cleared the patient for discharge.  Discharge Diagnoses:   Acute sigmoid diverticulitis with possible microperforation  -First episode of diverticulitis. -Treated with IV antibiotics, IV fluids. -Diet was gradually advanced.  General surgery following and has cleared the patient for discharge home today if he tolerates soft diet.   -hemodynamically stable and pain is improving. -Will need outpatient GI evaluation for colonoscopy - he will be discharged home on oral ciprofloxacin and Flagyl to complete 10-day course of total therapy.    Diabetes mellitus type 2 -Controlled.  A1c 5.9.  Carb modified diet.  Resume home regimen.  Migraine -Continue patient supplied at [facility]    Morbid obesity -Outpatient follow-up   Discharge Instructions  Discharge Instructions     Ambulatory referral to Gastroenterology   Complete by: As directed    Diverticulitis followup: ? Need for colonoscopy   What is the reason for referral?: Other   Diet Carb Modified   Complete by: As directed    Increase activity slowly   Complete by: As directed       Allergies as of 03/18/2021       Reactions   Amoxicillin Other (See Comments)   Causes blood in intestines- Had once incidence of bloody diarrhea but thinks he has tolerated since this incidence         Medication List     TAKE these medications    acetaminophen 500 MG tablet Commonly known as: TYLENOL Take 500-1,000 mg by mouth every 6 (six) hours as needed for headache (pain).   aspirin EC 81 MG tablet Take 81 mg by mouth See admin instructions. Take one tablet (81 mg) by mouth Monday thru Friday mornings. Swallow whole.   baclofen 10 MG tablet Commonly known as: LIORESAL Take 10 mg by mouth 2 (two) times daily as needed (migraines).   CALCIUM 600 PO Take 600 mg by mouth every morning.   cetirizine 10 MG tablet Commonly known as: ZYRTEC Take 10 mg by mouth daily as needed (seasonal allergies).   ciprofloxacin 500 MG tablet Commonly known as: CIPRO Take 1 tablet (500 mg total) by mouth 2 (two) times daily for 6 days.   cyanocobalamin 2000 MCG tablet Take 2,000 mcg by mouth every morning. Vitamin B12   metFORMIN 500 MG tablet Commonly known as: GLUCOPHAGE Take 1 tablet (500 mg total) by mouth daily with breakfast.   metroNIDAZOLE 500 MG tablet  Commonly known as: FLAGYL Take 1 tablet (500 mg total) by mouth every 8 (eight) hours for 6 days.   multivitamin with minerals Tabs tablet Take 1 tablet by mouth every morning.   NIACIN PO Take 1 tablet by mouth every morning.   ondansetron 4 MG tablet Commonly known as: Zofran Take 1 tablet (4 mg total) by mouth every 8 (eight) hours as needed for  nausea or vomiting.   PRESCRIPTION MEDICATION Inhale into the lungs at bedtime. BIPAP   Qulipta 60 MG Tabs Generic drug: Atogepant Take 60 mg by mouth at bedtime.   saccharomyces boulardii 250 MG capsule Commonly known as: FLORASTOR Take 1 capsule (250 mg total) by mouth 2 (two) times daily.   traMADol 50 MG tablet Commonly known as: Ultram Take 1 tablet (50 mg total) by mouth every 6 (six) hours as needed for severe pain.   triamcinolone 55 MCG/ACT Aero nasal inhaler Commonly known as: NASACORT Place 2 sprays into the nose at bedtime as needed (seasonal allergies).        Allergies  Allergen Reactions   Amoxicillin Other (See Comments)    Causes blood in intestines- Had once incidence of bloody diarrhea but thinks he has tolerated since this incidence     Consultations: General surgery   Procedures/Studies: CT ABDOMEN PELVIS W CONTRAST  Result Date: 03/14/2021 CLINICAL DATA:  Lower abdominal pain and pressure several days. Possible diverticulitis. EXAM: CT ABDOMEN AND PELVIS WITH CONTRAST TECHNIQUE: Multidetector CT imaging of the abdomen and pelvis was performed using the standard protocol following bolus administration of intravenous contrast. CONTRAST:  132mL OMNIPAQUE IOHEXOL 300 MG/ML  SOLN COMPARISON:  10/25/2015 FINDINGS: Lower chest: Lung bases are clear. Hepatobiliary: Prior cholecystectomy. 8 mm calcification projects over the common bile duct. No significant intrahepatic ductal dilatation. Pancreas: Normal. Spleen: Normal. Adrenals/Urinary Tract: Adrenal glands are normal. Kidneys are normal in size and demonstrate several punctate bilateral stones. No evidence of hydronephrosis. No focal mass. Ureters and bladder are normal. Stomach/Bowel: Stomach and small bowel are normal. Appendix is normal. There is diverticulosis of the colon most prominent over the sigmoid colon. There is moderate adjacent inflammatory change over the sigmoid colon in the midline pelvis with  minimal adjacent free fluid as findings are compatible with acute diverticulitis. No evidence of free peritoneal air. Small fluid collection abutting the inflamed diverticula likely phlegmon, although may represent evolving diverticular abscess. Vascular/Lymphatic: Abdominal aorta is normal. No significant adenopathy. Reproductive: Normal. Other: None. Musculoskeletal: No focal abnormality. IMPRESSION: 1. Colonic diverticulosis with evidence of acute diverticulitis involving the sigmoid colon in the midline pelvis. Small fluid collection abutting the inflamed diverticula likely phlegmon, although may represent evolving diverticular abscess. No evidence of free peritoneal air. 2. Bilateral nephrolithiasis. 3. 8 mm stone over the common bile duct without significant intrahepatic ductal dilatation. Prior cholecystectomy. Electronically Signed   By: Marin Olp M.D.   On: 03/14/2021 10:59      Subjective: Patient seen and examined at bedside.  Feels better and tolerating diet.  Had 2 bowel movements this morning.  Discharge Exam: Vitals:   03/17/21 1947 03/18/21 0343  BP: 117/70 119/76  Pulse: 62 (!) 53  Resp: 15 18  Temp: 98.2 F (36.8 C) 98.6 F (37 C)  SpO2: 96% 98%    General: Pt is alert, awake, not in acute distress Cardiovascular: rate controlled, S1/S2 + Respiratory: bilateral decreased breath sounds at bases Abdominal: Soft, morbidly obese, NT, ND, bowel sounds + Extremities: no edema, no cyanosis  The results of significant diagnostics from this hospitalization (including imaging, microbiology, ancillary and laboratory) are listed below for reference.     Microbiology: Recent Results (from the past 240 hour(s))  Resp Panel by RT-PCR (Flu A&B, Covid) Nasopharyngeal Swab     Status: None   Collection Time: 03/14/21 11:42 AM   Specimen: Nasopharyngeal Swab; Nasopharyngeal(NP) swabs in vial transport medium  Result Value Ref Range Status   SARS Coronavirus 2 by RT PCR  NEGATIVE NEGATIVE Final    Comment: (NOTE) SARS-CoV-2 target nucleic acids are NOT DETECTED.  The SARS-CoV-2 RNA is generally detectable in upper respiratory specimens during the acute phase of infection. The lowest concentration of SARS-CoV-2 viral copies this assay can detect is 138 copies/mL. A negative result does not preclude SARS-Cov-2 infection and should not be used as the sole basis for treatment or other patient management decisions. A negative result may occur with  improper specimen collection/handling, submission of specimen other than nasopharyngeal swab, presence of viral mutation(s) within the areas targeted by this assay, and inadequate number of viral copies(<138 copies/mL). A negative result must be combined with clinical observations, patient history, and epidemiological information. The expected result is Negative.  Fact Sheet for Patients:  EntrepreneurPulse.com.au  Fact Sheet for Healthcare Providers:  IncredibleEmployment.be  This test is no t yet approved or cleared by the [place] FDA and  has been authorized for detection and/or diagnosis of SARS-CoV-2 by FDA under an Emergency Use Authorization (EUA). This EUA will remain  in effect (meaning this test can be used) for the duration of the COVID-19 declaration under Section 564(b)(1) of the Act, 21 U.S.C.section 360bbb-3(b)(1), unless the authorization is terminated  or revoked sooner.       Influenza A by PCR NEGATIVE NEGATIVE Final   Influenza B by PCR NEGATIVE NEGATIVE Final    Comment: (NOTE) The Xpert Xpress SARS-CoV-2/FLU/RSV plus assay is intended as an aid in the diagnosis of influenza from Nasopharyngeal swab specimens and should not be used as a sole basis for treatment. Nasal washings and aspirates are unacceptable for Xpert Xpress SARS-CoV-2/FLU/RSV testing.  Fact Sheet for Patients: EntrepreneurPulse.com.au  Fact Sheet for  Healthcare Providers: IncredibleEmployment.be  This test is not yet approved or cleared by the [place] FDA and has been authorized for detection and/or diagnosis of SARS-CoV-2 by FDA under an Emergency Use Authorization (EUA). This EUA will remain in effect (meaning this test can be used) for the duration of the COVID-19 declaration under Section 564(b)(1) of the Act, 21 U.S.C. section 360bbb-3(b)(1), unless the authorization is terminated or revoked.  Performed at [facility], [address] [place], [place] [address]      Labs: BNP (last 3 results) No results for input(s): BNP in the last 8760 hours. Basic Metabolic Panel: Recent Labs  Lab 03/14/21 0932 03/15/21 0446 03/17/21 0455 03/18/21 0507  NA 137 137 137 138  K 3.7 3.5 3.7 3.6  CL 104 104 107 105  CO2 25 25 23 23   GLUCOSE 100* 100* 90 95  BUN 14 10 8 7   CREATININE 0.71 0.68 0.57* 0.61  CALCIUM 8.6* 8.1* 8.1* 8.4*  MG  --   --   --  2.1   Liver Function Tests: Recent Labs  Lab 03/14/21 0932 03/15/21 0446  AST 22 15  ALT 32 29  ALKPHOS 93 83  BILITOT 0.7 1.1  PROT 7.1 6.3*  ALBUMIN 3.8 3.7   No results for input(s): LIPASE, AMYLASE in the last  168 hours. No results for input(s): AMMONIA in the last 168 hours. CBC: Recent Labs  Lab 03/14/21 0932 03/15/21 0446 03/17/21 0455 03/18/21 0507  WBC 11.8* 9.1 6.7 7.5  NEUTROABS 9.2*  --   --  5.5  HGB 13.8 13.0 12.8* 13.4  HCT 40.2 39.1 38.4* 40.2  MCV 81.0 82.5 83.5 83.4  PLT 215 184 181 194   Cardiac Enzymes: No results for input(s): CKTOTAL, CKMB, CKMBINDEX, TROPONINI in the last 168 hours. BNP: Invalid input(s): POCBNP CBG: Recent Labs  Lab 03/17/21 1611 03/17/21 2143 03/18/21 0735 03/18/21 1221 03/18/21 1225  GLUCAP 106* 80 100* 146* 136*   D-Dimer No results for input(s): DDIMER in the last 72 hours. Hgb A1c No results for input(s): HGBA1C in the last 72 hours. Lipid Profile No results for  input(s): CHOL, HDL, LDLCALC, TRIG, CHOLHDL, LDLDIRECT in the last 72 hours. Thyroid function studies No results for input(s): TSH, T4TOTAL, T3FREE, THYROIDAB in the last 72 hours.  Invalid input(s): FREET3 Anemia work up No results for input(s): VITAMINB12, FOLATE, FERRITIN, TIBC, IRON, RETICCTPCT in the last 72 hours. Urinalysis    Component Value Date/Time   COLORURINE YELLOW 03/14/2021 0934   APPEARANCEUR CLEAR 03/14/2021 0934   LABSPEC >1.030 (H) 03/14/2021 0934   PHURINE 5.5 03/14/2021 0934   GLUCOSEU NEGATIVE 03/14/2021 0934   HGBUR NEGATIVE 03/14/2021 0934   BILIRUBINUR NEGATIVE 03/14/2021 0934   BILIRUBINUR negative 06/09/2020 1718   KETONESUR NEGATIVE 03/14/2021 0934   PROTEINUR NEGATIVE 03/14/2021 0934   UROBILINOGEN 0.2 06/09/2020 1718   NITRITE NEGATIVE 03/14/2021 0934   LEUKOCYTESUR TRACE (A) 03/14/2021 0934   Sepsis Labs Invalid input(s): PROCALCITONIN,  WBC,  LACTICIDVEN Microbiology Recent Results (from the past 240 hour(s))  Resp Panel by RT-PCR (Flu A&B, Covid) Nasopharyngeal Swab     Status: None   Collection Time: 03/14/21 11:42 AM   Specimen: Nasopharyngeal Swab; Nasopharyngeal(NP) swabs in vial transport medium  Result Value Ref Range Status   SARS Coronavirus 2 by RT PCR NEGATIVE NEGATIVE Final    Comment: (NOTE) SARS-CoV-2 target nucleic acids are NOT DETECTED.  The SARS-CoV-2 RNA is generally detectable in upper respiratory specimens during the acute phase of infection. The lowest concentration of SARS-CoV-2 viral copies this assay can detect is 138 copies/mL. A negative result does not preclude SARS-Cov-2 infection and should not be used as the sole basis for treatment or other patient management decisions. A negative result may occur with  improper specimen collection/handling, submission of specimen other than nasopharyngeal swab, presence of viral mutation(s) within the areas targeted by this assay, and inadequate number of viral copies(<138  copies/mL). A negative result must be combined with clinical observations, patient history, and epidemiological information. The expected result is Negative.  Fact Sheet for Patients:  EntrepreneurPulse.com.au  Fact Sheet for Healthcare Providers:  IncredibleEmployment.be  This test is no t yet approved or cleared by the [place] FDA and  has been authorized for detection and/or diagnosis of SARS-CoV-2 by FDA under an Emergency Use Authorization (EUA). This EUA will remain  in effect (meaning this test can be used) for the duration of the COVID-19 declaration under Section 564(b)(1) of the Act, 21 U.S.C.section 360bbb-3(b)(1), unless the authorization is terminated  or revoked sooner.       Influenza A by PCR NEGATIVE NEGATIVE Final   Influenza B by PCR NEGATIVE NEGATIVE Final    Comment: (NOTE) The Xpert Xpress SARS-CoV-2/FLU/RSV plus assay is intended as an aid in the diagnosis of influenza from Nasopharyngeal  swab specimens and should not be used as a sole basis for treatment. Nasal washings and aspirates are unacceptable for Xpert Xpress SARS-CoV-2/FLU/RSV testing.  Fact Sheet for Patients: EntrepreneurPulse.com.au  Fact Sheet for Healthcare Providers: IncredibleEmployment.be  This test is not yet approved or cleared by the [place] FDA and has been authorized for detection and/or diagnosis of SARS-CoV-2 by FDA under an Emergency Use Authorization (EUA). This EUA will remain in effect (meaning this test can be used) for the duration of the COVID-19 declaration under Section 564(b)(1) of the Act, 21 U.S.C. section 360bbb-3(b)(1), unless the authorization is terminated or revoked.  Performed at [facility], [address] [place], [place] [address]      Time coordinating discharge: 35 minutes  SIGNED:   Aline August, MD  [facility] 03/18/2021, 12:43 PM

## 2021-03-19 ENCOUNTER — Telehealth: Payer: Self-pay

## 2021-03-19 NOTE — Telephone Encounter (Signed)
Transition Care Management Unsuccessful Follow-up Telephone Call  Date of discharge and from where:  03/18/2021 from [facility]  Attempts:  1st Attempt  Reason for unsuccessful TCM follow-up call:  Left voice message

## 2021-03-21 ENCOUNTER — Other Ambulatory Visit: Payer: Self-pay | Admitting: Family Medicine

## 2021-03-22 NOTE — Telephone Encounter (Signed)
Transition Care Management Unsuccessful Follow-up Telephone Call  Date of discharge and from where:  03/18/2021 from [facility]  Attempts:  2nd Attempt  Reason for unsuccessful TCM follow-up call:  Left voice message

## 2021-03-23 NOTE — Telephone Encounter (Signed)
Please contact patient for appt. Sending 30 day medication refill

## 2021-03-23 NOTE — Telephone Encounter (Signed)
Appointment made for a HFU as patient was seen @ [facility] for severe diverticulitis and patient wanted to discuss this. AM

## 2021-03-24 DIAGNOSIS — F4323 Adjustment disorder with mixed anxiety and depressed mood: Secondary | ICD-10-CM | POA: Diagnosis not present

## 2021-03-25 NOTE — Telephone Encounter (Signed)
Transition Care Management Unsuccessful Follow-up Telephone Call  Date of discharge and from where:  03/22/2021 from [facility]  Attempts:  3rd Attempt  Reason for unsuccessful TCM follow-up call:  Unable to reach patient

## 2021-03-26 DIAGNOSIS — G459 Transient cerebral ischemic attack, unspecified: Secondary | ICD-10-CM | POA: Diagnosis not present

## 2021-03-26 DIAGNOSIS — G4733 Obstructive sleep apnea (adult) (pediatric): Secondary | ICD-10-CM | POA: Diagnosis not present

## 2021-03-26 DIAGNOSIS — G43009 Migraine without aura, not intractable, without status migrainosus: Secondary | ICD-10-CM | POA: Diagnosis not present

## 2021-03-26 DIAGNOSIS — E538 Deficiency of other specified B group vitamins: Secondary | ICD-10-CM | POA: Diagnosis not present

## 2021-03-30 ENCOUNTER — Ambulatory Visit (INDEPENDENT_AMBULATORY_CARE_PROVIDER_SITE_OTHER): Payer: BC Managed Care – PPO | Admitting: Family Medicine

## 2021-03-30 ENCOUNTER — Encounter: Payer: Self-pay | Admitting: Family Medicine

## 2021-03-30 ENCOUNTER — Other Ambulatory Visit: Payer: Self-pay

## 2021-03-30 VITALS — BP 114/62 | HR 62 | Temp 97.8°F | Ht 66.0 in | Wt 281.0 lb

## 2021-03-30 DIAGNOSIS — K5792 Diverticulitis of intestine, part unspecified, without perforation or abscess without bleeding: Secondary | ICD-10-CM

## 2021-03-30 DIAGNOSIS — G4733 Obstructive sleep apnea (adult) (pediatric): Secondary | ICD-10-CM

## 2021-03-30 DIAGNOSIS — E1169 Type 2 diabetes mellitus with other specified complication: Secondary | ICD-10-CM

## 2021-03-30 DIAGNOSIS — E669 Obesity, unspecified: Secondary | ICD-10-CM

## 2021-03-30 MED ORDER — METFORMIN HCL 500 MG PO TABS: 500.0000 mg | ORAL_TABLET | Freq: Every day | ORAL | 1 refills | Status: DC

## 2021-03-30 NOTE — Patient Instructions (Signed)
Diverticulitis Diverticulitis is infection or inflammation of small pouches (diverticula) in the colon that form due to a condition called diverticulosis. Diverticula can trap stool (feces) and bacteria, causing infection and inflammation. Diverticulitis may cause severe stomach pain and diarrhea. It may lead to tissue damage in the colon that causes bleeding or blockage. The diverticula may also burst (rupture) and cause infected stool to enter other areas of the abdomen. What are the causes? This condition is caused by stool becoming trapped in the diverticula, which allows bacteria to grow in the diverticula. This leads to inflammation and infection. What increases the risk? You are more likely to develop this condition if you have diverticulosis. The risk increases if you: Are overweight or obese. Do not get enough exercise. Drink alcohol. Use tobacco products. Eat a diet that has a lot of red meat such as beef, pork, or lamb. Eat a diet that does not include enough fiber. High-fiber foods include fruits, vegetables, beans, nuts, and whole grains. Are over 40 years of age. What are the signs or symptoms? Symptoms of this condition may include: Pain and tenderness in the abdomen. The pain is normally located on the left side of the abdomen, but it may occur in other areas. Fever and chills. Nausea. Vomiting. Cramping. Bloating. Changes in bowel routines. Blood in your stool. How is this diagnosed? This condition is diagnosed based on: Your medical history. A physical exam. Tests to make sure there is nothing else causing your condition. These tests may include: Blood tests. Urine tests. CT scan of the abdomen. How is this treated? Most cases of this condition are mild and can be treated at home. Treatment may include: Taking over-the-counter pain medicines. Following a clear liquid diet. Taking antibiotic medicines by mouth. Resting. More severe cases may need to be treated  at a hospital. Treatment may include: Not eating or drinking. Taking prescription pain medicine. Receiving antibiotic medicines through an IV. Receiving fluids and nutrition through an IV. Surgery. When your condition is under control, your health care provider may recommend that you have a colonoscopy. This is an exam to look at the entire large intestine. During the exam, a lubricated, bendable tube is inserted into the anus and then passed into the rectum, colon, and other parts of the large intestine. A colonoscopy can show how severe your diverticula are and whether something else may be causing your symptoms. Follow these instructions at home: Medicines Take over-the-counter and prescription medicines only as told by your health care provider. These include fiber supplements, probiotics, and stool softeners. If you were prescribed an antibiotic medicine, take it as told by your health care provider. Do not stop taking the antibiotic even if you start to feel better. Ask your health care provider if the medicine prescribed to you requires you to avoid driving or using machinery. Eating and drinking  Follow a full liquid diet or another diet as directed by your health care provider. After your symptoms improve, your health care provider may tell you to change your diet. He or she may recommend that you eat a diet that contains at least 25 grams (25 g) of fiber daily. Fiber makes it easier to pass stool. Healthy sources of fiber include: Berries. One cup contains 4-8 grams of fiber. Beans or lentils. One-half cup contains 5-8 grams of fiber. Green vegetables. One cup contains 4 grams of fiber. Avoid eating red meat. General instructions Do not use any products that contain nicotine or tobacco, such as cigarettes,   e-cigarettes, and chewing tobacco. If you need help quitting, ask your health care provider. Exercise for at least 30 minutes, 3 times each week. You should exercise hard enough to  raise your heart rate and break a sweat. Keep all follow-up visits as told by your health care provider. This is important. You may need to have a colonoscopy. Contact a health care provider if: Your pain does not improve. Your bowel movements do not return to normal. Get help right away if: Your pain gets worse. Your symptoms do not get better with treatment. Your symptoms suddenly get worse. You have a fever. You vomit more than one time. You have stools that are bloody, black, or tarry. Summary Diverticulitis is infection or inflammation of small pouches (diverticula) in the colon that form due to a condition called diverticulosis. Diverticula can trap stool (feces) and bacteria, causing infection and inflammation. You are at higher risk for this condition if you have diverticulosis and you eat a diet that does not include enough fiber. Most cases of this condition are mild and can be treated at home. More severe cases may need to be treated at a hospital. When your condition is under control, your health care provider may recommend that you have an exam called a colonoscopy. This exam can show how severe your diverticula are and whether something else may be causing your symptoms. Keep all follow-up visits as told by your health care provider. This is important. This information is not intended to replace advice given to you by your health care provider. Make sure you discuss any questions you have with your health care provider. Document Revised: 06/03/2019 Document Reviewed: 06/03/2019 Elsevier Patient Education  2022 [employer].  

## 2021-03-30 NOTE — Assessment & Plan Note (Signed)
He is tolerating BiPAP well.  Encouraged continued compliance.

## 2021-03-30 NOTE — Assessment & Plan Note (Signed)
His diabetes remains well controlled with metformin.  Recommend continuation with dietary changes.

## 2021-03-30 NOTE — Progress Notes (Signed)
Roberto Charles - 50 y.o. male MRN [id number]  Date of birth: June 24, 1971  Subjective Chief Complaint  Patient presents with   Hospitalization Follow-up    HPI Roberto Charles is a 50 year old male here today for hospitalization follow-up.  He was hospitalized from 03/14/2021 to 03/19/2019.  Initially presented to the emergency department with left lower quadrant abdominal discomfort.  CT of the abdomen pelvis showed sigmoid diverticulitis with possible microperforation.  Surgery consult was placed and he was started on IV antibiotics.  Diet was advanced as tolerated during his hospitalization and he was transitioned to Cipro and Flagyl prior to discharge.  He has recently completed this course.  He reports that his pain has resolved.  His bowel movements have almost returned back to normal.  He is taking Florastor as well.  He is not having any fevers, chills, nausea, vomiting.  There has been no blood in his stool.  A1c was checked in the hospital and was 5.9%.  He is continue to take metformin daily.  He is tolerating this well.  He is also made changes to his diet over the past several months.  He is also admitted to have severe sleep apnea and recently received equipment for BiPAP treatment.  He is tolerating this well and feels better with use of this.  ROS:  A comprehensive ROS was completed and negative except as noted per HPI  Allergies  Allergen Reactions   Amoxicillin Other (See Comments)    Causes blood in intestines- Had once incidence of bloody diarrhea but thinks he has tolerated since this incidence     Past Medical History:  Diagnosis Date   Anxiety    Diabetes mellitus without complication (HCC)    [id number])    Hypertension     Past Surgical History:  Procedure Laterality Date   CHOLECYSTECTOMY     MANDIBLE SURGERY  1995   VASECTOMY  2010    Social History   Socioeconomic History   Marital status: Married    Spouse name: Not on file   Number of children:  Not on file   Years of education: Not on file   Highest education level: Not on file  Occupational History   Occupation: Insurance  Tobacco Use   Smoking status: Never   Smokeless tobacco: Never  Vaping Use   Vaping Use: Never used  Substance and Sexual Activity   Alcohol use: Not Currently    Alcohol/week: 2.0 standard drinks    Types: 2 Standard drinks or equivalent per week    Comment: 2 X PER MONTH   Drug use: No   Sexual activity: Yes    Partners: Female    Birth control/protection: Other-see comments    Comment: Vasectomy  Other Topics Concern   Not on file  Social History Narrative   Not on file   Social Determinants of Health   Financial Resource Strain: Not on file  Food Insecurity: Not on file  Transportation Needs: Not on file  Physical Activity: Not on file  Stress: Not on file  Social Connections: Not on file    Family History  Problem Relation Age of Onset   Hypertension Father     Health Maintenance  Topic Date Due   PNEUMOCOCCAL POLYSACCHARIDE VACCINE AGE 79-64 HIGH RISK  Never done   FOOT EXAM  Never done   OPHTHALMOLOGY EXAM  Never done   URINE MICROALBUMIN  Never done   Hepatitis C Screening  Never done   COLONOSCOPY (Pts 45-66yr Insurance  coverage will need to be confirmed)  Never done   COVID-19 Vaccine (2 - Booster for Janssen series) 03/02/2020   INFLUENZA VACCINE  04/05/2021   HEMOGLOBIN A1C  09/15/2021   TETANUS/TDAP  01/15/2028   HIV Screening  Completed   Pneumococcal Vaccine 10-76 Years old  Aged Out   HPV VACCINES  Aged Out     ----------------------------------------------------------------------------------------------------------------------------------------------------------------------------------------------------------------- Physical Exam BP 114/62 (BP Location: Left Arm, Patient Position: Sitting, Cuff Size: Large)   Pulse 62   Temp 97.8 F (36.6 C)   Ht '5\' 6"'$  (1.676 m)   Wt 281 lb (127.5 kg)   SpO2 98%   BMI  45.35 kg/m   Physical Exam Constitutional:      Appearance: Normal appearance.  HENT:     Head: Normocephalic and atraumatic.  Eyes:     General: No scleral icterus. Cardiovascular:     Rate and Rhythm: Normal rate and regular rhythm.  Pulmonary:     Effort: Pulmonary effort is normal.     Breath sounds: Normal breath sounds.  Abdominal:     General: There is no distension.     Tenderness: There is no abdominal tenderness.  Musculoskeletal:     Cervical back: Neck supple.  Neurological:     General: No focal deficit present.     Mental Status: He is alert.  Psychiatric:        Mood and Affect: Mood normal.        Behavior: Behavior normal.    ------------------------------------------------------------------------------------------------------------------------------------------------------------------------------------------------------------------- Assessment and Plan  Diverticulitis He has completed course of antibiotics.  He is pain-free at this point.  He will continue on Florastor and continue to advance his diet as tolerated.  We discussed red flag symptoms including worsening pain or fever.  Update BMP and CBC with differential today.  Referral placed to GI for follow-up colonoscopy.  Diabetes mellitus type 2 in obese [facility]) His diabetes remains well controlled with metformin.  Recommend continuation with dietary changes.  Obstructive sleep apnea He is tolerating BiPAP well.  Encouraged continued compliance.   Meds ordered this encounter  Medications   metFORMIN (GLUCOPHAGE) 500 MG tablet    Sig: Take 1 tablet (500 mg total) by mouth daily with breakfast.    Dispense:  90 tablet    Refill:  1    Return in about 6 months (around 09/30/2021) for DM.    This visit occurred during the SARS-CoV-2 public health emergency.  Safety protocols were in place, including screening questions prior to the visit, additional usage of staff PPE, and extensive cleaning of exam  room while observing appropriate contact time as indicated for disinfecting solutions.

## 2021-03-30 NOTE — Assessment & Plan Note (Signed)
He has completed course of antibiotics.  He is pain-free at this point.  He will continue on Florastor and continue to advance his diet as tolerated.  We discussed red flag symptoms including worsening pain or fever.  Update BMP and CBC with differential today.  Referral placed to GI for follow-up colonoscopy.

## 2021-04-02 DIAGNOSIS — G4733 Obstructive sleep apnea (adult) (pediatric): Secondary | ICD-10-CM | POA: Diagnosis not present

## 2021-04-14 ENCOUNTER — Encounter: Payer: Self-pay | Admitting: Gastroenterology

## 2021-04-20 ENCOUNTER — Other Ambulatory Visit: Payer: Self-pay | Admitting: Family Medicine

## 2021-04-29 ENCOUNTER — Emergency Department (INDEPENDENT_AMBULATORY_CARE_PROVIDER_SITE_OTHER)
Admission: EM | Admit: 2021-04-29 | Discharge: 2021-04-29 | Disposition: A | Discharge status: Home / Self Care | Payer: BC Managed Care – PPO | Source: Home / Self Care

## 2021-04-29 ENCOUNTER — Other Ambulatory Visit: Payer: Self-pay

## 2021-04-29 ENCOUNTER — Encounter: Payer: Self-pay | Admitting: Emergency Medicine

## 2021-04-29 DIAGNOSIS — R1011 Right upper quadrant pain: Secondary | ICD-10-CM | POA: Diagnosis not present

## 2021-04-29 DIAGNOSIS — R197 Diarrhea, unspecified: Secondary | ICD-10-CM | POA: Diagnosis not present

## 2021-04-29 NOTE — Discharge Instructions (Addendum)
Home to rest Lots of clear liquids Bland diet May take tramadol for abdominal pain.  Take 1, and if you do not have relief within the hour take a second.  May take up to 2 pills 3 times a day If your abdominal pain gets worse instead of better, you spike a fever, or have vomiting you must go to the emergency room Call Dr. Zigmund Daniel tomorrow for follow-up

## 2021-04-29 NOTE — ED Triage Notes (Signed)
Patient c/o possible diverticulitis flare up, diagnosed in July.  Patient is having abdominal pain, diarrhea x 2 days.  Patient is uncomfortable.  Took a Tramadol for the pain, barely took the edge off.  Denies any nausea or vomiting.

## 2021-04-29 NOTE — ED Provider Notes (Signed)
[facility]    CSN: [id number] Arrival date & time: 04/29/21  1022      History   Chief Complaint Chief Complaint  Patient presents with   Diarrhea    HPI Roberto Charles is a 50 y.o. male.   HPI  Patient is here for abdominal pain.  He was hospitalized for abdominal pain on 03/14/2021 for 4 days.  He was found to have acute diverticulitis.  He was treated with IV fluids and IV antibiotics.  There was a question of perforation but he did recover without surgical intervention.  He followed up with his primary care doctor.  He does have an appointment scheduled with gastroenterology for a colonoscopy.  He states that he developed diarrhea again few days ago.  He has had a decreased appetite.  No fever or chills.  No nausea or vomiting.  Last night he awoke in the middle of the night right upper abdominal pain.  This is different from the left lower abdominal pain he had with diverticulitis.  He called his primary care doctor and could not be seen today.  He is here for evaluation. He has had a cholecystectomy in the past.  Past Medical History:  Diagnosis Date   Anxiety    Diabetes mellitus without complication ([place])    [id number])    Hypertension     Patient Active Problem List   Diagnosis Date Noted   Obstructive sleep apnea 03/30/2021   Diverticulitis 03/14/2021   Diabetes mellitus type 2 in obese ([place]) 11/30/2020   Hematuria 06/09/2020   Well adult exam 04/30/2020   Mixed hyperlipidemia 12/25/2016   Perennial allergic rhinitis with seasonal variation 12/25/2016   Obesity due to excess calories without serious comorbidity 09/14/2016   Snores 09/14/2016    Past Surgical History:  Procedure Laterality Date   [place]  2010       Home Medications    Prior to Admission medications   Medication Sig Start Date End Date Taking? Authorizing Provider  acetaminophen (TYLENOL) 500 MG tablet Take 500-1,000 mg  by mouth every 6 (six) hours as needed for headache (pain).   Yes [provider]  aspirin EC 81 MG tablet Take 81 mg by mouth See admin instructions. Take one tablet (81 mg) by mouth Monday thru Friday mornings. Swallow whole.   Yes [provider]  baclofen (LIORESAL) 10 MG tablet Take 10 mg by mouth 2 (two) times daily as needed (migraines). 02/23/21  Yes [provider]  Calcium Carbonate (CALCIUM 600 PO) Take 600 mg by mouth every morning.   Yes [provider]  cetirizine (ZYRTEC) 10 MG tablet Take 10 mg by mouth daily as needed (seasonal allergies).   Yes [provider]  cyanocobalamin 2000 MCG tablet Take 2,000 mcg by mouth every morning. Vitamin B12   Yes [provider]  metFORMIN (GLUCOPHAGE) 500 MG tablet TAKE 1 TABLET DAILY WITH   BREAKFAST 04/20/21  Yes Emeterio Reeve, DO  NIACIN PO Take 1 tablet by mouth every morning.   Yes [provider]  ondansetron (ZOFRAN) 4 MG tablet Take 1 tablet (4 mg total) by mouth every 8 (eight) hours as needed for nausea or vomiting. 03/18/21  Yes Aline August, MD  saccharomyces boulardii (FLORASTOR) 250 MG capsule Take 1 capsule (250 mg total) by mouth 2 (two) times daily. 03/18/21  Yes Aline August, MD  traMADol (ULTRAM) 50 MG tablet Take 1 tablet (50  mg total) by mouth every 6 (six) hours as needed for severe pain. 03/18/21 03/18/22 Yes Aline August, MD  traMADol (ULTRAM) 50 MG tablet Take by mouth. 03/18/21  Yes [provider]  triamcinolone (NASACORT) 55 MCG/ACT AERO nasal inhaler Place 2 sprays into the nose at bedtime as needed (seasonal allergies).   Yes [provider]  Atogepant (QULIPTA) 60 MG TABS Take 60 mg by mouth at bedtime.    [provider]  Multiple Vitamin (MULTIVITAMIN WITH MINERALS) TABS tablet Take 1 tablet by mouth every morning.    [provider]  ondansetron (ZOFRAN) 4 MG tablet Take by mouth. 03/18/21   [provider]  PRESCRIPTION MEDICATION Inhale into the lungs at bedtime. BIPAP    [provider]    Family History Family History  Problem Relation Age of Onset   Hypertension Father     Social History Social History   Tobacco Use   Smoking status: Never   Smokeless tobacco: Never  Vaping Use   Vaping Use: Never used  Substance Use Topics   Alcohol use: Not Currently    Alcohol/week: 2.0 standard drinks    Types: 2 Standard drinks or equivalent per week    Comment: 2 X PER MONTH   Drug use: No     Allergies   Amoxicillin   Review of Systems Review of Systems See HPI  Physical Exam Triage Vital Signs ED Triage Vitals  Enc Vitals Group     BP 04/29/21 1031 117/76     Pulse Rate 04/29/21 1031 67     Resp 04/29/21 1031 18     Temp 04/29/21 1031 99 F (37.2 C)     Temp Source 04/29/21 1031 Oral     SpO2 04/29/21 1031 97 %     Weight 04/29/21 1033 277 lb (125.6 kg)     Height 04/29/21 1033 '5\' 6"'$  (1.676 m)     Head Circumference --      Peak Flow --      Pain Score 04/29/21 1033 7     Pain Loc --      Pain Edu? --      Excl. in [place]? --    No data found.  Updated Vital Signs BP 117/76 (BP Location: Left Arm)   Pulse 67   Temp 99 F (37.2 C) (Oral)   Resp 18   Ht '5\' 6"'$  (1.676 m)   Wt 125.6 kg   SpO2 97%   BMI 44.71 kg/m      Physical Exam Constitutional:      General: He is in acute distress.     Appearance: He is well-developed. He is obese.     Comments: Guarded movements.  Appears uncomfortable  HENT:     Head: Normocephalic and atraumatic.     Mouth/Throat:     Comments: Mask is in place Eyes:     Conjunctiva/sclera: Conjunctivae normal.     Pupils: Pupils are equal, round, and reactive to light.  Cardiovascular:     Rate and Rhythm: Normal rate and regular rhythm.     Heart sounds: Normal heart sounds.  Pulmonary:     Effort: Pulmonary effort is normal. No respiratory distress.     Breath sounds: Normal breath sounds. No wheezing or  rales.  Abdominal:     General: There is no distension.     Palpations: Abdomen is soft. There is no mass.     Tenderness: There is abdominal tenderness. There is  no guarding or rebound.     Comments: Patient has had obese abdomen with large pannus.  Bowel sounds are heard.  There is tenderness to deep palpation in the right upper quadrant.  No tenderness elsewhere in the abdomen.  No guarding or rebound.  No CVA tenderness  Musculoskeletal:        General: Normal range of motion.     Cervical back: Normal range of motion and neck supple.  Skin:    General: Skin is warm and dry.  Neurological:     Mental Status: He is alert.  Psychiatric:        Mood and Affect: Mood normal.        Behavior: Behavior normal.     UC Treatments / Results  Labs (all labs ordered are listed, but only abnormal results are displayed) Labs Reviewed  COMPLETE METABOLIC PANEL WITH GFR  LIPASE  CBC WITH DIFFERENTIAL/PLATELET    EKG   Radiology No results found.  Procedures Procedures (including critical care time)  Medications Ordered in UC Medications - No data to display  Initial Impression / Assessment and Plan / UC Course  I have reviewed the triage vital signs and the nursing notes.  Pertinent labs & imaging results that were available during my care of the patient were reviewed by me and considered in my medical decision making (see chart for details).     Patient feels he needs another CAT scan.  I told him that this is indicated given his current symptoms.  His abdomen is hurting but he does not is acute as his hospitalization last month.  I discussed his case with his primary care doctor.  Dr. Zigmund Daniel agrees to see him in follow-up.  I will order lab work and give him instructions for home care.  He understands he must go the emergency room if he becomes worse instead of better Final Clinical Impressions(s) / UC Diagnoses   Final diagnoses:  RUQ abdominal pain  Diarrhea in adult  patient     Discharge Instructions      Home to rest Lots of clear liquids Bland diet May take tramadol for abdominal pain.  Take 1, and if you do not have relief within the hour take a second.  May take up to 2 pills 3 times a day If your abdominal pain gets worse instead of better, you spike a fever, or have vomiting you must go to the emergency room Call Dr. Zigmund Daniel tomorrow for follow-up   ED Prescriptions   None    PDMP not reviewed this encounter.   Raylene Everts, MD 04/29/21 [phone]

## 2021-04-30 LAB — COMPLETE METABOLIC PANEL WITH GFR
AG Ratio: 1.7 (calc) (ref 1.0–2.5)
ALT: 39 U/L (ref 9–46)
AST: 18 U/L (ref 10–40)
Albumin: 4.2 g/dL (ref 3.6–5.1)
Alkaline phosphatase (APISO): 98 U/L (ref 36–130)
BUN: 7 mg/dL (ref 7–25)
CO2: 23 mmol/L (ref 20–32)
Calcium: 8.8 mg/dL (ref 8.6–10.3)
Chloride: 104 mmol/L (ref 98–110)
Creat: 0.72 mg/dL (ref 0.60–1.29)
Globulin: 2.5 g/dL (calc) (ref 1.9–3.7)
Glucose, Bld: 88 mg/dL (ref 65–99)
Potassium: 3.8 mmol/L (ref 3.5–5.3)
Sodium: 138 mmol/L (ref 135–146)
Total Bilirubin: 0.8 mg/dL (ref 0.2–1.2)
Total Protein: 6.7 g/dL (ref 6.1–8.1)
eGFR: 112 mL/min/{1.73_m2} (ref >=60)

## 2021-04-30 LAB — CBC WITH DIFFERENTIAL/PLATELET
Absolute Monocytes: 782 cells/uL (ref 200–950)
Basophils Absolute: 30 cells/uL (ref 0–200)
Basophils Relative: 0.3 %
Eosinophils Absolute: 129 cells/uL (ref 15–500)
Eosinophils Relative: 1.3 %
HCT: 42 % (ref 38.5–50.0)
Hemoglobin: 14.2 g/dL (ref 13.2–17.1)
Lymphs Abs: 1673 cells/uL (ref 850–3900)
MCH: 28 pg (ref 27.0–33.0)
MCHC: 33.8 g/dL (ref 32.0–36.0)
MCV: 82.8 fL (ref 80.0–100.0)
MPV: 10.6 fL (ref 7.5–12.5)
Monocytes Relative: 7.9 %
Neutro Abs: 7286 cells/uL (ref 1500–7800)
Neutrophils Relative %: 73.6 %
Platelets: 197 10*3/uL (ref 140–400)
RBC: 5.07 10*6/uL (ref 4.20–5.80)
RDW: 14.3 % (ref 11.0–15.0)
Total Lymphocyte: 16.9 %
WBC: 9.9 10*3/uL (ref 3.8–10.8)

## 2021-04-30 LAB — LIPASE: Lipase: 9 U/L (ref 7–60)

## 2021-05-03 DIAGNOSIS — G4733 Obstructive sleep apnea (adult) (pediatric): Secondary | ICD-10-CM | POA: Diagnosis not present

## 2021-05-04 ENCOUNTER — Ambulatory Visit (INDEPENDENT_AMBULATORY_CARE_PROVIDER_SITE_OTHER): Payer: BC Managed Care – PPO | Admitting: Gastroenterology

## 2021-05-04 ENCOUNTER — Encounter: Payer: Self-pay | Admitting: Gastroenterology

## 2021-05-04 ENCOUNTER — Other Ambulatory Visit: Payer: Self-pay

## 2021-05-04 VITALS — BP 126/80 | HR 62 | Ht 66.0 in | Wt 278.0 lb

## 2021-05-04 DIAGNOSIS — G4733 Obstructive sleep apnea (adult) (pediatric): Secondary | ICD-10-CM

## 2021-05-04 DIAGNOSIS — K5792 Diverticulitis of intestine, part unspecified, without perforation or abscess without bleeding: Secondary | ICD-10-CM | POA: Diagnosis not present

## 2021-05-04 DIAGNOSIS — R1011 Right upper quadrant pain: Secondary | ICD-10-CM | POA: Diagnosis not present

## 2021-05-04 DIAGNOSIS — Z6841 Body Mass Index (BMI) 40.0 and over, adult: Secondary | ICD-10-CM

## 2021-05-04 DIAGNOSIS — R197 Diarrhea, unspecified: Secondary | ICD-10-CM

## 2021-05-04 MED ORDER — CLENPIQ 10-3.5-12 MG-GM -GM/160ML PO SOLN: 1.0000 | Freq: Once | ORAL | 0 refills | Status: AC

## 2021-05-04 NOTE — Patient Instructions (Addendum)
If you are age 50 or older, your body mass index should be between 23-30. Your Body mass index is 44.87 kg/m. If this is out of the aforementioned range listed, please consider follow up with your Primary Care Provider.  If you are age 68 or younger, your body mass index should be between 19-25. Your Body mass index is 44.87 kg/m. If this is out of the aformentioned range listed, please consider follow up with your Primary Care Provider.   __________________________________________________________  The [place] [facility] providers would like to encourage you to use [facility] to communicate with providers for non-urgent requests or questions.  Due to long hold times on the telephone, sending your provider a message by [facility] may be a faster and more efficient way to get a response.  Please allow 48 business hours for a response.  Please remember that this is for non-urgent requests.   We have sent the following medications to your pharmacy for you to pick up at your convenience: Clenpiq  You have been scheduled for a colonoscopy. Please follow written instructions given to you at your visit today.  Please pick up your prep supplies at the pharmacy within the next 1-3 days. If you use inhalers (even only as needed), please bring them with you on the day of your procedure.  Please call with any questions or concerns.  It was a pleasure to see you today!  Vito Cirigliano, D.O.

## 2021-05-04 NOTE — Progress Notes (Signed)
Chief Complaint: History of acute diverticulitis, hospital follow-up  Referring Provider:     Luetta Nutting, DO  HPI:     Roberto Charles is a 50 y.o. male with history of diabetes, obesity (BMI 44.7), migraines, OSA (on BiPAP), hx of ccy, referred to the Gastroenterology Clinic for evaluation of hospital follow-up for acute diverticulitis.  Was hospitalized 7/10-14 with sigmoid diverticulitis with possible microperforation on CT.  Treated with IV ABX.  Surgery consulted and recommended medical management.  Was discharged home with Cipro/Flagyl x10 days, probiotics, with recommendation for outpatient colonoscopy in 6-8 weeks.  Inpatient GI not consulted. - CT (03/14/2021): Diverticulosis with sigmoid diverticulitis and minimal adjacent free air with small phlegmon vs less likely evolving diverticular abscess.  No free air.  Normal liver, 8 mm calcification over CBD without duct dilatation/obstruction.  Prior ccy - Normal liver enzymes, WBC 11.8 which normalized by discharge.  Otherwise normal CBC  Was then seen in the Urgent Care on 04/29/2021 with RUQ abdominal pain, diarrhea x4 days, decreased appetite. - Hemodynamically stable, afebrile - TTP in RUQ - WBC 9.9, normal CBC - Normal CMP, lipase - No repeat imaging done  Today, he states the RUQ pain and diarrhea have resolved with conservative management.  No nausea/vomiting.   Back to his normal state of health.  Still taking probiotics as prescribed.   July was first episode of diverticulitis.   No previous colonoscopy.   Father had diverticulitis and polyps. Otherwise, no known family history of CRC, GI malignancy, liver disease, pancreatic disease, or IBD.    Past Medical History:  Diagnosis Date   Anxiety    Diabetes mellitus without complication ([place])    [id number])    Hypertension    Obesity    Sleep apnea    on BPAP since June     Past Surgical History:  Procedure Laterality Date   CHOLECYSTECTOMY      MANDIBLE SURGERY  1995   VASECTOMY  2010   Family History  Problem Relation Age of Onset   Hypertension Father    Colon polyps Father    Uterine cancer Maternal Grandmother    Uterine cancer Maternal Aunt    Testicular cancer Paternal Uncle    Colon cancer Neg Hx    Pancreatic cancer Neg Hx    Stomach cancer Neg Hx    Liver cancer Neg Hx    Esophageal cancer Neg Hx    Social History   Tobacco Use   Smoking status: Never   Smokeless tobacco: Never  Vaping Use   Vaping Use: Never used  Substance Use Topics   Alcohol use: Not Currently    Alcohol/week: 2.0 standard drinks    Types: 2 Standard drinks or equivalent per week    Comment: 2 X PER MONTH   Drug use: No   Current Outpatient Medications  Medication Sig Dispense Refill   acetaminophen (TYLENOL) 500 MG tablet Take 500-1,000 mg by mouth every 6 (six) hours as needed for headache (pain).     aspirin EC 81 MG tablet Take 81 mg by mouth See admin instructions. Take one tablet (81 mg) by mouth Monday thru Friday mornings. Swallow whole.     baclofen (LIORESAL) 10 MG tablet Take 10 mg by mouth 2 (two) times daily as needed (migraines).     Calcium Carbonate (CALCIUM 600 PO) Take 600 mg by mouth every morning.     cetirizine (ZYRTEC) 10 MG  tablet Take 10 mg by mouth daily as needed (seasonal allergies).     cyanocobalamin 2000 MCG tablet Take 2,000 mcg by mouth every morning. Vitamin B12     metFORMIN (GLUCOPHAGE) 500 MG tablet TAKE 1 TABLET DAILY WITH   BREAKFAST 90 tablet 3   NIACIN PO Take 1 tablet by mouth every morning.     ondansetron (ZOFRAN) 4 MG tablet Take 1 tablet (4 mg total) by mouth every 8 (eight) hours as needed for nausea or vomiting. 20 tablet 0   PRESCRIPTION MEDICATION Inhale into the lungs at bedtime. BIPAP     saccharomyces boulardii (FLORASTOR) 250 MG capsule Take 1 capsule (250 mg total) by mouth 2 (two) times daily. 30 capsule 0   traMADol (ULTRAM) 50 MG tablet Take 1 tablet (50 mg total) by mouth  every 6 (six) hours as needed for severe pain. 14 tablet 0   triamcinolone (NASACORT) 55 MCG/ACT AERO nasal inhaler Place 2 sprays into the nose at bedtime as needed (seasonal allergies).     No current facility-administered medications for this visit.   Allergies  Allergen Reactions   Amoxicillin Other (See Comments)    Causes blood in intestines- Had once incidence of bloody diarrhea but thinks he has tolerated since this incidence      Review of Systems: All systems reviewed and negative except where noted in HPI.     Physical Exam:    Wt Readings from Last 3 Encounters:  05/04/21 278 lb (126.1 kg)  04/29/21 277 lb (125.6 kg)  03/30/21 281 lb (127.5 kg)    BP 126/80   Pulse 62   Ht '5\' 6"'$  (1.676 m)   Wt 278 lb (126.1 kg)   SpO2 96%   BMI 44.87 kg/m  Constitutional:  Pleasant, in no acute distress. Psychiatric: Normal mood and affect. Behavior is normal. EENT: Pupils normal.  Conjunctivae are normal. No scleral icterus. Neck supple. No cervical LAD. Cardiovascular: Normal rate, regular rhythm. No edema Pulmonary/chest: Effort normal and breath sounds normal. No wheezing, rales or rhonchi. Abdominal: Soft, nondistended, nontender. Bowel sounds active throughout. There are no masses palpable. No hepatomegaly. Neurological: Alert and oriented to person place and time. Skin: Skin is warm and dry. No rashes noted.   ASSESSMENT AND PLAN;   1) Acute diverticulitis - Symptoms resolved with IV ABX then 10-day course of p.o. ABX - Continue probiotics - Plan for colonoscopy at 8+ weeks (mid Sept or later) - Ok to resume ASA 81 mg in the perioperative period  2) RUQ pain 3) Diarrhea - Both symptoms have since resolved with conservative management.  Higher suspicion for acute infectious etiology, but did discuss the possibility of retained stone based on prior CT scan.  Thankfully liver enzymes, T bili, ALP were all normal, so that suspicion was a bit lower -If RUQ pain  returns, plan for RUQ [place] to eval  4) Obesity (BMI 44.9) 5) OSA 6) Diabetes - Continue regular follow-up with Dr. Zigmund Daniel  The indications, risks, and benefits of colonoscopy were explained to the patient in detail. Risks include but are not limited to bleeding, perforation, adverse reaction to medications, and cardiopulmonary compromise. Sequelae include but are not limited to the possibility of surgery, hospitalization, and mortality. The patient verbalized understanding and wished to proceed. All questions answered, referred to the scheduler and bowel prep ordered. Further recommendations pending results of the exam.     Lavena Bullion, DO, FACG  05/04/2021, 9:13 AM   Luetta Nutting, DO

## 2021-05-11 DIAGNOSIS — Z9989 Dependence on other enabling machines and devices: Secondary | ICD-10-CM | POA: Diagnosis not present

## 2021-05-11 DIAGNOSIS — G4733 Obstructive sleep apnea (adult) (pediatric): Secondary | ICD-10-CM | POA: Diagnosis not present

## 2021-05-21 ENCOUNTER — Other Ambulatory Visit: Payer: Self-pay

## 2021-05-21 ENCOUNTER — Encounter: Payer: Self-pay | Admitting: Gastroenterology

## 2021-05-21 ENCOUNTER — Ambulatory Visit (AMBULATORY_SURGERY_CENTER): Payer: BC Managed Care – PPO | Admitting: Gastroenterology

## 2021-05-21 VITALS — BP 142/78 | HR 60 | Temp 98.9°F | Resp 15 | Ht 66.0 in | Wt 278.0 lb

## 2021-05-21 DIAGNOSIS — K5730 Diverticulosis of large intestine without perforation or abscess without bleeding: Secondary | ICD-10-CM

## 2021-05-21 DIAGNOSIS — K635 Polyp of colon: Secondary | ICD-10-CM | POA: Diagnosis not present

## 2021-05-21 DIAGNOSIS — K5792 Diverticulitis of intestine, part unspecified, without perforation or abscess without bleeding: Secondary | ICD-10-CM | POA: Diagnosis not present

## 2021-05-21 DIAGNOSIS — R1011 Right upper quadrant pain: Secondary | ICD-10-CM

## 2021-05-21 DIAGNOSIS — D125 Benign neoplasm of sigmoid colon: Secondary | ICD-10-CM

## 2021-05-21 MED ORDER — SODIUM CHLORIDE 0.9 % IV SOLN: 500.0000 mL | INTRAVENOUS | Status: DC

## 2021-05-21 NOTE — Progress Notes (Signed)
GASTROENTEROLOGY PROCEDURE H&P NOTE   Primary Care Physician: Luetta Nutting, DO    Reason for Procedure:  Colonoscopy  Plan:    Sigmoid diverticulitis  Patient is appropriate for endoscopic procedure(s) in the ambulatory ([facility]) setting.  The nature of the procedure, as well as the risks, benefits, and alternatives were carefully and thoroughly reviewed with the patient. Ample time for discussion and questions allowed. The patient understood, was satisfied, and agreed to proceed.     HPI: Roberto Charles is a 50 y.o. male who presents for colonoscopy for evaluation of recent acute diverticulitis in July 2022.  Patient was most recently seen in the Gastroenterology Clinic on 05/04/2021.  No interval change in medical history since that appointment. Please refer to that note for full details regarding GI history and clinical presentation.   Past Medical History:  Diagnosis Date   Allergy    Anxiety    Diabetes mellitus without complication ([place])    [id number])    Hypertension    Kidney stones    Obesity    Sleep apnea    on BPAP since June    Past Surgical History:  Procedure Laterality Date   [place]  2010    Prior to Admission medications   Medication Sig Start Date End Date Taking? Authorizing Provider  acetaminophen (TYLENOL) 500 MG tablet Take 500-1,000 mg by mouth every 6 (six) hours as needed for headache (pain).   Yes [provider]  aspirin EC 81 MG tablet Take 81 mg by mouth See admin instructions. Take one tablet (81 mg) by mouth Monday thru Friday mornings. Swallow whole.   Yes [provider]  Calcium Carbonate (CALCIUM 600 PO) Take 600 mg by mouth every morning.   Yes [provider]  cyanocobalamin 2000 MCG tablet Take 2,000 mcg by mouth every morning. Vitamin B12   Yes [provider]  metFORMIN (GLUCOPHAGE) 500 MG tablet TAKE 1 TABLET DAILY WITH   BREAKFAST 04/20/21   Yes Emeterio Reeve, DO  NIACIN PO Take 1 tablet by mouth every morning.   Yes [provider]  PRESCRIPTION MEDICATION Inhale into the lungs at bedtime. BIPAP   Yes [provider]  baclofen (LIORESAL) 10 MG tablet Take 10 mg by mouth 2 (two) times daily as needed (migraines). 02/23/21   [provider]  cetirizine (ZYRTEC) 10 MG tablet Take 10 mg by mouth daily as needed (seasonal allergies).    [provider]  ondansetron (ZOFRAN) 4 MG tablet Take 1 tablet (4 mg total) by mouth every 8 (eight) hours as needed for nausea or vomiting. 03/18/21   Aline August, MD  saccharomyces boulardii (FLORASTOR) 250 MG capsule Take 1 capsule (250 mg total) by mouth 2 (two) times daily. 03/18/21   Aline August, MD  traMADol (ULTRAM) 50 MG tablet Take 1 tablet (50 mg total) by mouth every 6 (six) hours as needed for severe pain. 03/18/21 03/18/22  Aline August, MD  triamcinolone (NASACORT) 55 MCG/ACT AERO nasal inhaler Place 2 sprays into the nose at bedtime as needed (seasonal allergies).    [provider]    Current Outpatient Medications  Medication Sig Dispense Refill   acetaminophen (TYLENOL) 500 MG tablet Take 500-1,000 mg by mouth every 6 (six) hours as needed for headache (pain).     aspirin EC 81 MG tablet Take 81 mg by mouth See admin instructions. Take one tablet (81 mg) by mouth Monday thru  Friday mornings. Swallow whole.     Calcium Carbonate (CALCIUM 600 PO) Take 600 mg by mouth every morning.     cyanocobalamin 2000 MCG tablet Take 2,000 mcg by mouth every morning. Vitamin B12     metFORMIN (GLUCOPHAGE) 500 MG tablet TAKE 1 TABLET DAILY WITH   BREAKFAST 90 tablet 3   NIACIN PO Take 1 tablet by mouth every morning.     PRESCRIPTION MEDICATION Inhale into the lungs at bedtime. BIPAP     baclofen (LIORESAL) 10 MG tablet Take 10 mg by mouth 2 (two) times daily as needed (migraines).     cetirizine (ZYRTEC) 10 MG tablet Take 10 mg by mouth daily as  needed (seasonal allergies).     ondansetron (ZOFRAN) 4 MG tablet Take 1 tablet (4 mg total) by mouth every 8 (eight) hours as needed for nausea or vomiting. 20 tablet 0   saccharomyces boulardii (FLORASTOR) 250 MG capsule Take 1 capsule (250 mg total) by mouth 2 (two) times daily. 30 capsule 0   traMADol (ULTRAM) 50 MG tablet Take 1 tablet (50 mg total) by mouth every 6 (six) hours as needed for severe pain. 14 tablet 0   triamcinolone (NASACORT) 55 MCG/ACT AERO nasal inhaler Place 2 sprays into the nose at bedtime as needed (seasonal allergies).     Current Facility-Administered Medications  Medication Dose Route Frequency Provider Last Rate Last Admin   0.9 %  sodium chloride infusion  500 mL Intravenous Continuous Donyell Ding V, DO        Allergies as of 05/21/2021 - Review Complete 05/21/2021  Allergen Reaction Noted   Amoxicillin Other (See Comments) 03/18/2011    Family History  Problem Relation Age of Onset   Hypertension Father    Colon polyps Father    Uterine cancer Maternal Aunt    Testicular cancer Paternal Uncle    Uterine cancer Maternal Grandmother    Colon cancer Neg Hx    Pancreatic cancer Neg Hx    Stomach cancer Neg Hx    Liver cancer Neg Hx    Esophageal cancer Neg Hx    Rectal cancer Neg Hx     Social History   Socioeconomic History   Marital status: Married    Spouse name: Not on file   Number of children: 2   Years of education: Not on file   Highest education level: Not on file  Occupational History   Occupation: Insurance  Tobacco Use   Smoking status: Never   Smokeless tobacco: Never  Vaping Use   Vaping Use: Never used  Substance and Sexual Activity   Alcohol use: Not Currently    Alcohol/week: 2.0 standard drinks    Types: 2 Standard drinks or equivalent per week    Comment: 2 X PER MONTH   Drug use: No   Sexual activity: Yes    Partners: Female    Birth control/protection: Other-see comments    Comment: Vasectomy  Other Topics  Concern   Not on file  Social History Narrative   Not on file   Social Determinants of Health   Financial Resource Strain: Not on file  Food Insecurity: Not on file  Transportation Needs: Not on file  Physical Activity: Not on file  Stress: Not on file  Social Connections: Not on file  Intimate Partner Violence: Not on file    Physical Exam: Vital signs in last 24 hours: '@BP'$  112/65   Pulse 65   Temp 98.9 F (37.2 C)  Ht '5\' 6"'$  (1.676 m)   Wt 278 lb (126.1 kg)   SpO2 96%   BMI 44.87 kg/m  GEN: NAD EYE: Sclerae anicteric ENT: MMM CV: Non-tachycardic Pulm: CTA b/l GI: Soft, NT/ND NEURO:  Alert & Oriented x 3   Gerrit Heck, DO [facility]   05/21/2021 11:15 AM

## 2021-05-21 NOTE — Progress Notes (Signed)
A/ox3, pleased with MAC, report to RN 

## 2021-05-21 NOTE — Op Note (Signed)
[facility] Patient Name: Roberto Charles Procedure Date: 05/21/2021 11:18 AM MRN: [id number] Endoscopist: Gerrit Heck , MD Age: 50 Referring MD:  Date of Birth: 24-Jul-1971 Gender: Male Account #: [id number] Procedure:                Colonoscopy Indications:              Follow-up of diverticulitis                           50 yo male was hospitalized 7/10-14 with sigmoid                            diverticulitis with possible microperforation on                            CT. Treated with IV ABX. Surgery consulted and                            recommended medical management. Was discharged home                            with Cipro/Flagyl x10 days, probiotics with                            resolution of index symptoms. He presents today for                            colonsocopy for evaluation. No previous colonoscopy. Medicines:                Monitored Anesthesia Care Procedure:                Pre-Anesthesia Assessment:                           - Prior to the procedure, a History and Physical                            was performed, and patient medications and                            allergies were reviewed. The patient's tolerance of                            previous anesthesia was also reviewed. The risks                            and benefits of the procedure and the sedation                            options and risks were discussed with the patient.                            All questions were answered, and informed consent  was obtained. Prior Anticoagulants: The patient has                            taken no previous anticoagulant or antiplatelet                            agents. ASA Grade Assessment: III - A patient with                            severe systemic disease. After reviewing the risks                            and benefits, the patient was deemed in                            satisfactory condition to  undergo the procedure.                           After obtaining informed consent, the colonoscope                            was passed under direct vision. Throughout the                            procedure, the patient's blood pressure, pulse, and                            oxygen saturations were monitored continuously. The                            Olympus CF-HQ190L [id number]) Colonoscope was                            introduced through the anus and advanced to the the                            terminal ileum. The colonoscopy was technically                            difficult and complex due to a tortuous colon. The                            patient tolerated the procedure well. The quality                            of the bowel preparation was good. The terminal                            ileum, ileocecal valve, appendiceal orifice, and                            rectum were photographed. Scope In: 11:23:30 AM Scope Out: 11:42:53 AM Scope Withdrawal Time: 0 hours 11 minutes 21 seconds  Total Procedure  Duration: 0 hours 19 minutes 23 seconds  Findings:                 The perianal and digital rectal examinations were                            normal.                           A 5 mm polyp was found in the sigmoid colon. The                            polyp was sessile. The polyp was removed with a                            cold snare. Resection and retrieval were complete.                            Estimated blood loss was minimal.                           Multiple small and large-mouthed diverticula were                            found in the sigmoid colon.                           The sigmoid colon was significantly tortuous in an                            area of dense diverticular disease, located 40-50                            cm from the anal verge. This was eventually                            traversed using a series of gentle                             advancing/reducing maneuvers of the colonoscope                            along with irrigation and water immersion. There                            was mild erythema surrounding one of the                            diverticula, but the mucosa was otherwise normal                            appearing.                           The rectum, descending colon, transverse colon,  ascending colon and cecum appeared normal.                           The retroflexed view of the distal rectum and anal                            verge was normal and showed no anal or rectal                            abnormalities.                           The terminal ileum appeared normal. Complications:            No immediate complications. Estimated Blood Loss:     Estimated blood loss was minimal. Impression:               - One 5 mm polyp in the sigmoid colon, removed with                            a cold snare. Resected and retrieved.                           - Diverticulosis in the sigmoid colon.                           - Tortuous sigmoid colon.                           - The rectum, descending colon, transverse colon,                            ascending colon and cecum are normal.                           - The distal rectum and anal verge are normal on                            retroflexion view.                           - The examined portion of the ileum was normal. Recommendation:           - Patient has a contact number available for                            emergencies. The signs and symptoms of potential                            delayed complications were discussed with the                            patient. Return to normal activities tomorrow.                            Written discharge instructions were  provided to the                            patient.                           - Resume previous diet.                           - Continue present  medications.                           - Await pathology results.                           - Repeat colonoscopy for surveillance based on                            pathology results.                           - Return to GI office PRN. Gerrit Heck, MD 05/21/2021 11:58:36 AM

## 2021-05-21 NOTE — Patient Instructions (Signed)
Thank you for letting us take care of your healthcare needs today. Please see handouts given to you on Polyps and Diverticulosis.   YOU HAD AN ENDOSCOPIC PROCEDURE TODAY AT THE [facility]:   Refer to the procedure report that was given to you for any specific questions about what was found during the examination.  If the procedure report does not answer your questions, please call your gastroenterologist to clarify.  If you requested that your care partner not be given the details of your procedure findings, then the procedure report has been included in a sealed envelope for you to review at your convenience later.  YOU SHOULD EXPECT: Some feelings of bloating in the abdomen. Passage of more gas than usual.  Walking can help get rid of the air that was put into your GI tract during the procedure and reduce the bloating. If you had a lower endoscopy (such as a colonoscopy or flexible sigmoidoscopy) you may notice spotting of blood in your stool or on the toilet paper. If you underwent a bowel prep for your procedure, you may not have a normal bowel movement for a few days.  Please Note:  You might notice some irritation and congestion in your nose or some drainage.  This is from the oxygen used during your procedure.  There is no need for concern and it should clear up in a day or so.  SYMPTOMS TO REPORT IMMEDIATELY:  Following lower endoscopy (colonoscopy or flexible sigmoidoscopy):  Excessive amounts of blood in the stool  Significant tenderness or worsening of abdominal pains  Swelling of the abdomen that is new, acute  Fever of 100F or higher   For urgent or emergent issues, a gastroenterologist can be reached at any hour by calling [phone]. Do not use MyChart messaging for urgent concerns.    DIET:  We do recommend a small meal at first, but then you may proceed to your regular diet.  Drink plenty of fluids but you should avoid alcoholic beverages for 24  hours.  ACTIVITY:  You should plan to take it easy for the rest of today and you should NOT DRIVE or use heavy machinery until tomorrow (because of the sedation medicines used during the test).    FOLLOW UP: Our staff will call the number listed on your records 48-72 hours following your procedure to check on you and address any questions or concerns that you may have regarding the information given to you following your procedure. If we do not reach you, we will leave a message.  We will attempt to reach you two times.  During this call, we will ask if you have developed any symptoms of COVID 19. If you develop any symptoms (ie: fever, flu-like symptoms, shortness of breath, cough etc.) before then, please call [phone].  If you test positive for Covid 19 in the 2 weeks post procedure, please call and report this information to us.    If any biopsies were taken you will be contacted by phone or by letter within the next 1-3 weeks.  Please call us at [phone] if you have not heard about the biopsies in 3 weeks.    SIGNATURES/CONFIDENTIALITY: You and/or your care partner have signed paperwork which will be entered into your electronic medical record.  These signatures attest to the fact that that the information above on your After Visit Summary has been reviewed and is understood.  Full responsibility of the confidentiality of this discharge information lies   with you and/or your care-partner.  

## 2021-05-21 NOTE — Progress Notes (Signed)
VS taken by DT 

## 2021-05-21 NOTE — Progress Notes (Signed)
VS- Roberto Charles 

## 2021-05-21 NOTE — Progress Notes (Signed)
Called to room to assist during endoscopic procedure.  Patient ID and intended procedure confirmed with present staff. Received instructions for my participation in the procedure from the performing physician.  

## 2021-05-25 ENCOUNTER — Telehealth: Payer: Self-pay

## 2021-05-25 NOTE — Telephone Encounter (Signed)
Second post procedure follow up visit no answer.

## 2021-05-25 NOTE — Telephone Encounter (Signed)
No answer, left message to call back later today, B.Eain Mullendore RN. 

## 2021-05-31 ENCOUNTER — Encounter: Payer: Self-pay | Admitting: Gastroenterology

## 2021-06-03 DIAGNOSIS — G4733 Obstructive sleep apnea (adult) (pediatric): Secondary | ICD-10-CM | POA: Diagnosis not present

## 2021-07-03 DIAGNOSIS — G4733 Obstructive sleep apnea (adult) (pediatric): Secondary | ICD-10-CM | POA: Diagnosis not present

## 2021-07-25 ENCOUNTER — Encounter (HOSPITAL_BASED_OUTPATIENT_CLINIC_OR_DEPARTMENT_OTHER): Payer: Self-pay

## 2021-07-25 ENCOUNTER — Emergency Department (HOSPITAL_BASED_OUTPATIENT_CLINIC_OR_DEPARTMENT_OTHER)
Admission: EM | Admit: 2021-07-25 | Discharge: 2021-07-25 | Disposition: A | Discharge status: Home / Self Care | Payer: BC Managed Care – PPO | Attending: Emergency Medicine | Admitting: Emergency Medicine

## 2021-07-25 ENCOUNTER — Emergency Department (HOSPITAL_BASED_OUTPATIENT_CLINIC_OR_DEPARTMENT_OTHER): Payer: BC Managed Care – PPO

## 2021-07-25 ENCOUNTER — Other Ambulatory Visit: Payer: Self-pay

## 2021-07-25 DIAGNOSIS — Z9049 Acquired absence of other specified parts of digestive tract: Secondary | ICD-10-CM | POA: Diagnosis not present

## 2021-07-25 DIAGNOSIS — K5732 Diverticulitis of large intestine without perforation or abscess without bleeding: Secondary | ICD-10-CM | POA: Diagnosis not present

## 2021-07-25 DIAGNOSIS — E1169 Type 2 diabetes mellitus with other specified complication: Secondary | ICD-10-CM | POA: Diagnosis not present

## 2021-07-25 DIAGNOSIS — Z7984 Long term (current) use of oral hypoglycemic drugs: Secondary | ICD-10-CM | POA: Diagnosis not present

## 2021-07-25 DIAGNOSIS — Z7982 Long term (current) use of aspirin: Secondary | ICD-10-CM | POA: Diagnosis not present

## 2021-07-25 DIAGNOSIS — I10 Essential (primary) hypertension: Secondary | ICD-10-CM | POA: Diagnosis not present

## 2021-07-25 DIAGNOSIS — N200 Calculus of kidney: Secondary | ICD-10-CM | POA: Diagnosis not present

## 2021-07-25 DIAGNOSIS — R1032 Left lower quadrant pain: Secondary | ICD-10-CM | POA: Diagnosis not present

## 2021-07-25 DIAGNOSIS — K6389 Other specified diseases of intestine: Secondary | ICD-10-CM | POA: Diagnosis not present

## 2021-07-25 LAB — CBC WITH DIFFERENTIAL/PLATELET
Abs Immature Granulocytes: 0.04 10*3/uL (ref 0.00–0.07)
Basophils Absolute: 0 10*3/uL (ref 0.0–0.1)
Basophils Relative: 0 %
Eosinophils Absolute: 0.2 10*3/uL (ref 0.0–0.5)
Eosinophils Relative: 2 %
HCT: 42 % (ref 39.0–52.0)
Hemoglobin: 14.2 g/dL (ref 13.0–17.0)
Immature Granulocytes: 0 %
Lymphocytes Relative: 19 %
Lymphs Abs: 1.7 10*3/uL (ref 0.7–4.0)
MCH: 27.5 pg (ref 26.0–34.0)
MCHC: 33.8 g/dL (ref 30.0–36.0)
MCV: 81.4 fL (ref 80.0–100.0)
Monocytes Absolute: 0.7 10*3/uL (ref 0.1–1.0)
Monocytes Relative: 7 %
Neutro Abs: 6.5 10*3/uL (ref 1.7–7.7)
Neutrophils Relative %: 72 %
Platelets: 200 10*3/uL (ref 150–400)
RBC: 5.16 MIL/uL (ref 4.22–5.81)
RDW: 13.8 % (ref 11.5–15.5)
WBC: 9.1 10*3/uL (ref 4.0–10.5)
nRBC: 0 % (ref 0.0–0.2)

## 2021-07-25 LAB — COMPREHENSIVE METABOLIC PANEL
ALT: 31 U/L (ref 0–44)
AST: 20 U/L (ref 15–41)
Albumin: 4.1 g/dL (ref 3.5–5.0)
Alkaline Phosphatase: 90 U/L (ref 38–126)
Anion gap: 9 (ref 5–15)
BUN: 13 mg/dL (ref 6–20)
CO2: 24 mmol/L (ref 22–32)
Calcium: 8.6 mg/dL — ABNORMAL LOW (ref 8.9–10.3)
Chloride: 105 mmol/L (ref 98–111)
Creatinine, Ser: 0.72 mg/dL (ref 0.61–1.24)
GFR, Estimated: >60 mL/min (ref >60)
Glucose, Bld: 92 mg/dL (ref 70–99)
Potassium: 3.6 mmol/L (ref 3.5–5.1)
Sodium: 138 mmol/L (ref 135–145)
Total Bilirubin: 0.9 mg/dL (ref 0.3–1.2)
Total Protein: 7.3 g/dL (ref 6.5–8.1)

## 2021-07-25 LAB — LIPASE, BLOOD: Lipase: 25 U/L (ref 11–51)

## 2021-07-25 MED ORDER — CIPROFLOXACIN HCL 500 MG PO TABS
500.0000 mg | ORAL_TABLET | Freq: Once | ORAL | Status: AC
  Administered 2021-07-25: 500 mg via ORAL
  Filled 2021-07-25: qty 1

## 2021-07-25 MED ORDER — METRONIDAZOLE 500 MG PO TABS
500.0000 mg | ORAL_TABLET | Freq: Once | ORAL | Status: AC
  Administered 2021-07-25: 500 mg via ORAL
  Filled 2021-07-25: qty 1

## 2021-07-25 MED ORDER — HYDROCODONE-ACETAMINOPHEN 5-325 MG PO TABS: 1.0000 | ORAL_TABLET | Freq: Four times a day (QID) | ORAL | 0 refills | Status: DC | PRN

## 2021-07-25 MED ORDER — CIPROFLOXACIN HCL 500 MG PO TABS: 500.0000 mg | ORAL_TABLET | Freq: Two times a day (BID) | ORAL | 0 refills | Status: DC

## 2021-07-25 MED ORDER — SODIUM CHLORIDE 0.9 % IV BOLUS
500.0000 mL | Freq: Once | INTRAVENOUS | Status: AC
  Administered 2021-07-25: 500 mL via INTRAVENOUS

## 2021-07-25 MED ORDER — FENTANYL CITRATE PF 50 MCG/ML IJ SOSY
50.0000 ug | PREFILLED_SYRINGE | Freq: Once | INTRAMUSCULAR | Status: AC
  Administered 2021-07-25: 50 ug via INTRAVENOUS
  Filled 2021-07-25: qty 1

## 2021-07-25 MED ORDER — IOHEXOL 300 MG/ML  SOLN
100.0000 mL | Freq: Once | INTRAMUSCULAR | Status: AC | PRN
  Administered 2021-07-25: 100 mL via INTRAVENOUS

## 2021-07-25 MED ORDER — METRONIDAZOLE 500 MG PO TABS: 500.0000 mg | ORAL_TABLET | Freq: Two times a day (BID) | ORAL | 0 refills | Status: DC

## 2021-07-25 NOTE — ED Provider Notes (Signed)
[facility] EMERGENCY DEPARTMENT Provider Note   CSN: [id number] Arrival date & time: 07/25/21  1004     History Chief Complaint  Patient presents with   Abdominal Pain    Roberto Charles is a 50 y.o. male.  He has a history of diabetes and hypertension.  Complaining of suprapubic and left lower quadrant abdominal pain starting 2 days ago.  Had multiple episodes of diarrhea yesterday but a normal bowel movement today.  Pain is persistent.  No radiation.  No fevers chills shortness of breath or urinary symptoms.  No blood in his stool.  Has a history of diverticulitis and this feels similar although in a slightly different location.  The history is provided by the patient.  Abdominal Pain Pain location:  Suprapubic and LLQ Pain quality: aching   Pain radiates to:  Does not radiate Pain severity:  Moderate Onset quality:  Gradual Duration:  3 days Timing:  Constant Progression:  Worsening Chronicity:  Recurrent Context: not recent travel, not suspicious food intake and not trauma   Relieved by:  Nothing Worsened by:  Nothing Ineffective treatments:  None tried Associated symptoms: diarrhea   Associated symptoms: no chest pain, no chills, no constipation, no cough, no dysuria, no fever, no hematemesis, no hematochezia, no hematuria, no melena, no nausea, no shortness of breath, no sore throat and no vomiting       Past Medical History:  Diagnosis Date   Allergy    Anxiety    Diabetes mellitus without complication (HCC)    DJMEQAST(419.6)    Hypertension    Kidney stones    Obesity    Sleep apnea    on BPAP since June    Patient Active Problem List   Diagnosis Date Noted   Obstructive sleep apnea 03/30/2021   Diverticulitis 03/14/2021   Diabetes mellitus type 2 in obese ([place]) 11/30/2020   Hematuria 06/09/2020   Well adult exam 04/30/2020   Mixed hyperlipidemia 12/25/2016   Perennial allergic rhinitis with seasonal variation 12/25/2016   Obesity due to  excess calories without serious comorbidity 09/14/2016   Snores 09/14/2016    Past Surgical History:  Procedure Laterality Date   CHOLECYSTECTOMY     MANDIBLE SURGERY  1995   VASECTOMY  2010       Family History  Problem Relation Age of Onset   Hypertension Father    Colon polyps Father    Uterine cancer Maternal Aunt    Testicular cancer Paternal Uncle    Uterine cancer Maternal Grandmother    Colon cancer Neg Hx    Pancreatic cancer Neg Hx    Stomach cancer Neg Hx    Liver cancer Neg Hx    Esophageal cancer Neg Hx    Rectal cancer Neg Hx     Social History   Tobacco Use   Smoking status: Never   Smokeless tobacco: Never  Vaping Use   Vaping Use: Never used  Substance Use Topics   Alcohol use: Not Currently    Alcohol/week: 2.0 standard drinks    Types: 2 Standard drinks or equivalent per week    Comment: 2 X PER MONTH   Drug use: No    Home Medications Prior to Admission medications   Medication Sig Start Date End Date Taking? Authorizing Provider  acetaminophen (TYLENOL) 500 MG tablet Take 500-1,000 mg by mouth every 6 (six) hours as needed for headache (pain).    [provider]  aspirin EC 81 MG tablet Take 81 mg  by mouth See admin instructions. Take one tablet (81 mg) by mouth Monday thru Friday mornings. Swallow whole.    [provider]  baclofen (LIORESAL) 10 MG tablet Take 10 mg by mouth 2 (two) times daily as needed (migraines). 02/23/21   [provider]  Calcium Carbonate (CALCIUM 600 PO) Take 600 mg by mouth every morning.    [provider]  cetirizine (ZYRTEC) 10 MG tablet Take 10 mg by mouth daily as needed (seasonal allergies).    [provider]  cyanocobalamin 2000 MCG tablet Take 2,000 mcg by mouth every morning. Vitamin B12    [provider]  metFORMIN (GLUCOPHAGE) 500 MG tablet TAKE 1 TABLET DAILY WITH   BREAKFAST 04/20/21   Emeterio Reeve, DO  NIACIN PO Take 1 tablet by mouth every  morning.    [provider]  ondansetron (ZOFRAN) 4 MG tablet Take 1 tablet (4 mg total) by mouth every 8 (eight) hours as needed for nausea or vomiting. 03/18/21   Aline August, MD  PRESCRIPTION MEDICATION Inhale into the lungs at bedtime. BIPAP    [provider]  saccharomyces boulardii (FLORASTOR) 250 MG capsule Take 1 capsule (250 mg total) by mouth 2 (two) times daily. 03/18/21   Aline August, MD  traMADol (ULTRAM) 50 MG tablet Take 1 tablet (50 mg total) by mouth every 6 (six) hours as needed for severe pain. 03/18/21 03/18/22  Aline August, MD  triamcinolone (NASACORT) 55 MCG/ACT AERO nasal inhaler Place 2 sprays into the nose at bedtime as needed (seasonal allergies).    [provider]    Allergies    Amoxicillin  Review of Systems   Review of Systems  Constitutional:  Negative for chills and fever.  HENT:  Negative for sore throat.   Eyes:  Negative for visual disturbance.  Respiratory:  Negative for cough and shortness of breath.   Cardiovascular:  Negative for chest pain.  Gastrointestinal:  Positive for abdominal pain and diarrhea. Negative for constipation, hematemesis, hematochezia, melena, nausea and vomiting.  Genitourinary:  Negative for dysuria and hematuria.  Musculoskeletal:  Negative for neck pain.  Skin:  Negative for rash.  Neurological:  Negative for headaches.   Physical Exam Updated Vital Signs BP 130/77 (BP Location: Right Arm)   Pulse 81   Temp 97.8 F (36.6 C) (Oral)   Resp 17   Ht 5\' 6"  (1.676 m)   Wt 129.3 kg   SpO2 95%   BMI 46.00 kg/m   Physical Exam Vitals and nursing note reviewed.  Constitutional:      General: He is not in acute distress.    Appearance: Normal appearance. He is well-developed.  HENT:     Head: Normocephalic and atraumatic.  Eyes:     Conjunctiva/sclera: Conjunctivae normal.  Cardiovascular:     Rate and Rhythm: Normal rate and regular rhythm.     Heart sounds: No murmur  heard. Pulmonary:     Effort: Pulmonary effort is normal. No respiratory distress.     Breath sounds: Normal breath sounds.  Abdominal:     Palpations: Abdomen is soft.     Tenderness: There is abdominal tenderness in the suprapubic area and left lower quadrant. There is no guarding or rebound.  Musculoskeletal:        General: No swelling. Normal range of motion.     Cervical back: Neck supple.  Skin:    General: Skin is warm and dry.     Capillary Refill: Capillary refill takes less  than 2 seconds.  Neurological:     General: No focal deficit present.     Mental Status: He is alert.  Psychiatric:        Mood and Affect: Mood normal.    ED Results / Procedures / Treatments   Labs (all labs ordered are listed, but only abnormal results are displayed) Labs Reviewed  COMPREHENSIVE METABOLIC PANEL - Abnormal; Notable for the following components:      Result Value   Calcium 8.6 (*)    All other components within normal limits  LIPASE, BLOOD  CBC WITH DIFFERENTIAL/PLATELET    EKG None  Radiology CT Abdomen Pelvis W Contrast  Result Date: 07/25/2021 CLINICAL DATA:  Diverticulitis suspected.  Pain. EXAM: CT ABDOMEN AND PELVIS WITH CONTRAST TECHNIQUE: Multidetector CT imaging of the abdomen and pelvis was performed using the standard protocol following bolus administration of intravenous contrast. CONTRAST:  124mL OMNIPAQUE IOHEXOL 300 MG/ML  SOLN COMPARISON:  March 14, 2021 FINDINGS: Lower chest: No acute abnormality. Hepatobiliary: No focal liver abnormality is seen. Status post cholecystectomy. No biliary dilatation. Pancreas: Unremarkable. No pancreatic ductal dilatation or surrounding inflammatory changes. Spleen: Normal in size without focal abnormality. Adrenals/Urinary Tract: Adrenal glands are normal. A few scattered small stones are seen in the kidneys without hydronephrosis or perinephric stranding. No suspicious renal masses. Ureters and bladder are normal. Stomach/Bowel:  The stomach and small bowel are normal. There is thickening of the proximal sigmoid colon with adjacent fat stranding and multiple diverticula. No extraluminal gas or fluid collection identified. The remainder of the colon is unremarkable. The appendix is normal. Vascular/Lymphatic: No significant vascular findings are present. No enlarged abdominal or pelvic lymph nodes. Reproductive: Prostate is unremarkable. Other: No abdominal wall hernia or abnormality. No abdominopelvic ascites. Musculoskeletal: No acute or significant osseous findings. IMPRESSION: 1. Sigmoid diverticulitis without extraluminal gas or fluid collection. Approximately 7 cm of sigmoid colon appears to be involved. 2. Nonobstructive stones in the kidneys. 3. No other abnormalities. Electronically Signed   By: Dorise Bullion III M.D.   On: 07/25/2021 12:23    Procedures Procedures   Medications Ordered in ED Medications  sodium chloride 0.9 % bolus 500 mL ( Intravenous Stopped 07/25/21 1243)  fentaNYL (SUBLIMAZE) injection 50 mcg (50 mcg Intravenous Given 07/25/21 1109)  iohexol (OMNIPAQUE) 300 MG/ML solution 100 mL (100 mLs Intravenous Contrast Given 07/25/21 1159)  ciprofloxacin (CIPRO) tablet 500 mg (500 mg Oral Given 07/25/21 1250)  metroNIDAZOLE (FLAGYL) tablet 500 mg (500 mg Oral Given 07/25/21 1250)    ED Course  I have reviewed the triage vital signs and the nursing notes.  Pertinent labs & imaging results that were available during my care of the patient were reviewed by me and considered in my medical decision making (see chart for details).  Clinical Course as of 07/25/21 1724  Sun Jul 25, 2021  1231 Reviewed results with patient.  He feels he can manage this at home with oral antibiotics and pain medication.  Return instructions discussed [MB]    Clinical Course User Index [MB] Hayden Rasmussen, MD   MDM Rules/Calculators/A&P                          This patient complains of suprapubic abdominal pain and  loose stools; this involves an extensive number of treatment Options and is a complaint that carries with it a high risk of complications and Morbidity. The differential includes infectious diarrhea, UTI, diverticulitis, colitis, obstruction  I ordered, reviewed and interpreted labs, which included CBC with normal white count normal hemoglobin, chemistries and LFTs normal, blood is normal I ordered medication IV fluids pain medication oral antibiotics I ordered imaging studies which included CT abdomen pelvis and I independently    visualized and interpreted imaging which showed acute uncomplicated diverticulitis  Previous records obtained and reviewed in epic, patient was admitted for diverticulitis with microperforation in the past.  After the interventions stated above, I reevaluated the patient and found patient still to be symptomatic.  We discussed admission versus treatment at home and he feels he can manage his symptoms at home.  Will provide prescriptions for antibiotics and pain medicine.  Return instructions discussed   Final Clinical Impression(s) / ED Diagnoses Final diagnoses:  Sigmoid diverticulitis    Rx / DC Orders ED Discharge Orders          Ordered    ciprofloxacin (CIPRO) 500 MG tablet  2 times daily        07/25/21 1233    metroNIDAZOLE (FLAGYL) 500 MG tablet  2 times daily        07/25/21 1233    HYDROcodone-acetaminophen (NORCO/VICODIN) 5-325 MG tablet  Every 6 hours PRN        07/25/21 1233             Hayden Rasmussen, MD 07/25/21 1726

## 2021-07-25 NOTE — ED Triage Notes (Signed)
Pt presents to ED from home C/O lower middle abdominal pain X 2 days. Denies n/v. Pt reports diarrhea yesterday, then normal BM today. Denies fever, chills.

## 2021-07-25 NOTE — Discharge Instructions (Signed)
You were seen in the emergency department for lower abdominal pain.  Your CAT scan was positive for diverticulitis.  We are treating you with antibiotics and pain medication.  Please do not drink alcohol while taking the antibiotics.  Follow-up with your primary care doctor.  Clear liquid diet until pain improved.  Return to the emergency department if any high fevers or worsening pain.

## 2021-07-25 NOTE — ED Notes (Signed)
Patient transported to CT 

## 2021-07-26 ENCOUNTER — Telehealth: Payer: Self-pay | Admitting: General Practice

## 2021-07-26 NOTE — Telephone Encounter (Signed)
Transition Care Management Unsuccessful Follow-up Telephone Call  Date of discharge and from where:  07/25/21 from [facility]  Attempts:  1st Attempt  Reason for unsuccessful TCM follow-up call:  Unable to leave message

## 2021-07-27 DIAGNOSIS — E538 Deficiency of other specified B group vitamins: Secondary | ICD-10-CM | POA: Diagnosis not present

## 2021-07-27 DIAGNOSIS — G4733 Obstructive sleep apnea (adult) (pediatric): Secondary | ICD-10-CM | POA: Diagnosis not present

## 2021-07-27 DIAGNOSIS — G43009 Migraine without aura, not intractable, without status migrainosus: Secondary | ICD-10-CM | POA: Diagnosis not present

## 2021-07-28 NOTE — Telephone Encounter (Signed)
Transition Care Management Unsuccessful Follow-up Telephone Call  Date of discharge and from where:  07/25/21 from [facility]  Attempts:  2nd Attempt  Reason for unsuccessful TCM follow-up call:  Unable to leave message

## 2021-08-02 NOTE — Telephone Encounter (Signed)
Transition Care Management Unsuccessful Follow-up Telephone Call  Date of discharge and from where:  07/25/21 from [facility]  Attempts:  3rd Attempt  Reason for unsuccessful TCM follow-up call:  Unable to leave message

## 2021-08-03 DIAGNOSIS — G4733 Obstructive sleep apnea (adult) (pediatric): Secondary | ICD-10-CM | POA: Diagnosis not present

## 2021-08-12 ENCOUNTER — Encounter: Payer: Self-pay | Admitting: Gastroenterology

## 2021-08-12 MED ORDER — CIPROFLOXACIN HCL 500 MG PO TABS: 500.0000 mg | ORAL_TABLET | Freq: Two times a day (BID) | ORAL | 0 refills | Status: DC

## 2021-08-12 MED ORDER — METRONIDAZOLE 500 MG PO TABS: 500.0000 mg | ORAL_TABLET | Freq: Three times a day (TID) | ORAL | 0 refills | Status: DC

## 2021-08-12 NOTE — Telephone Encounter (Signed)
Dr. 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 [name] pt with a history of diverticulitis. He was recently seen in the ED and treated with Cipro and Flagyl for 7 days. Pt feels like symptoms are returning after the completed treatment. Please advise if OK to refill prescriptions. Thanks

## 2021-08-12 NOTE — Telephone Encounter (Signed)
Extent course of abx in pt with PCN allergy Cipro 500 mg BID, metronidazole 500 mg TID x 7 days He should call if not improving or worsening He needs to be seen next available APP or Cirigliano for continuity in setting of diverticulitis JMP

## 2021-08-12 NOTE — Telephone Encounter (Signed)
New prescriptions sent to [facility] [employer] in [place]. Patient has been scheduled for follow up with Dr. Bryan Lemma on Monday, 09/20/21 at 9:40 am. Pt has been notified of recommendations and appt information via my chart.

## 2021-09-02 DIAGNOSIS — G4733 Obstructive sleep apnea (adult) (pediatric): Secondary | ICD-10-CM | POA: Diagnosis not present

## 2021-09-16 IMAGING — CT CT ABD-PELV W/ CM
2 of 5 series · 16 of 46 positions shown, 18 images · IV contrast (Omnipaque)
Comparison: 10/25/2015

CLINICAL DATA: Lower abdominal pain and pressure several days.
Possible diverticulitis.

EXAM:
CT ABDOMEN AND PELVIS WITH CONTRAST
TECHNIQUE: Multidetector CT imaging of the abdomen and pelvis was performed
using the standard protocol following bolus administration of
intravenous contrast.
CONTRAST:  100mL OMNIPAQUE IOHEXOL 300 MG/ML  SOLN

[Series 2: axial st · axial · 0.98mm/px · z∈[-472,-42]mm · 13 of 98 slices shown, 15 images]
[im 6/98  soft-tissue]
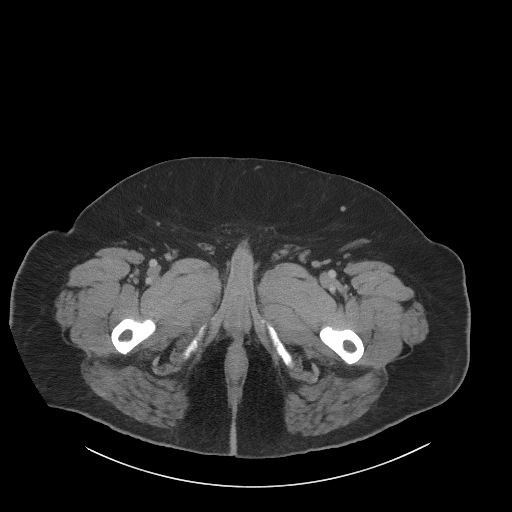
[im 6/98  bone]
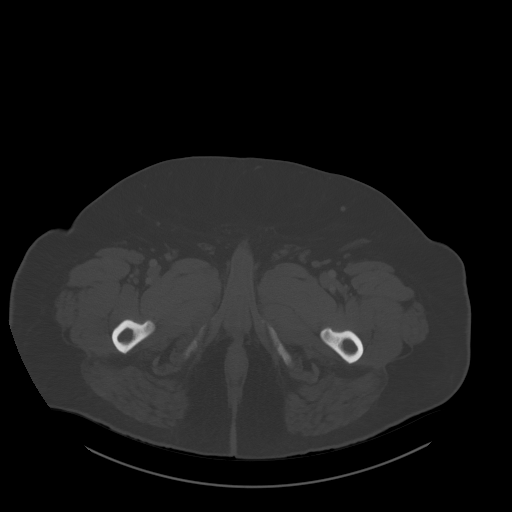
[im 16/98  soft-tissue]
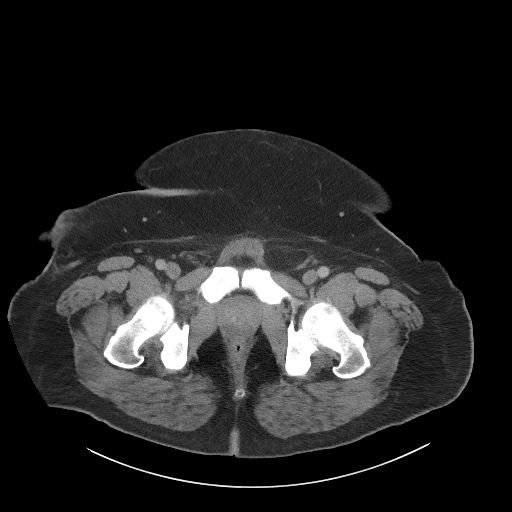
[im 21/98  soft-tissue]
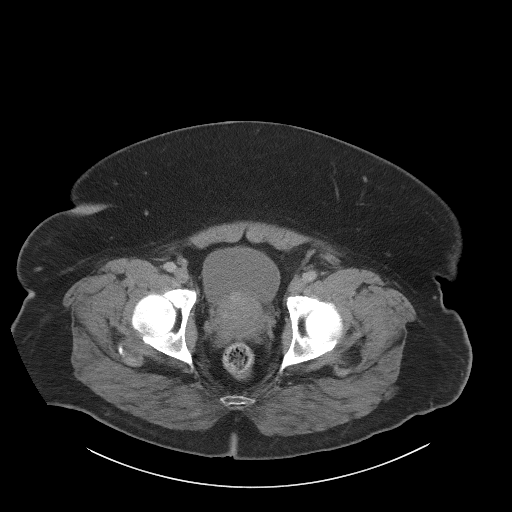
[im 26/98  soft-tissue]
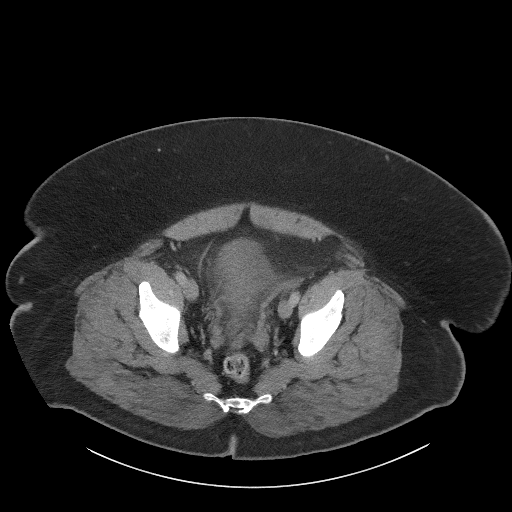
[im 36/98  soft-tissue]
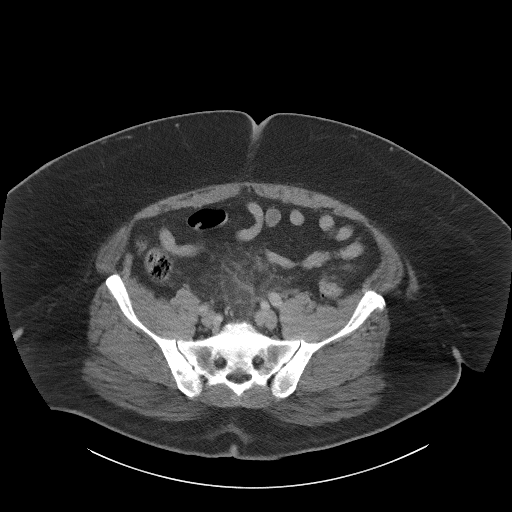
[im 41/98  soft-tissue]
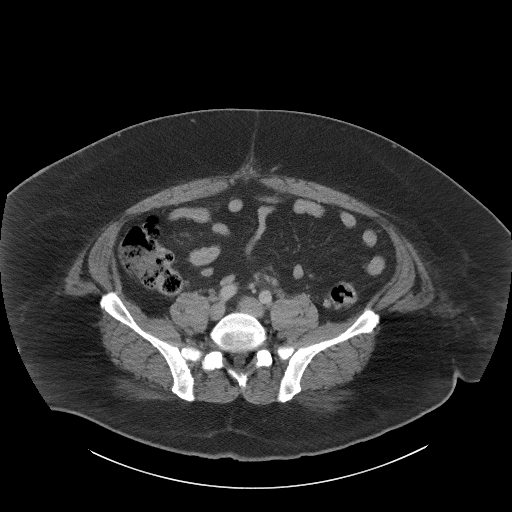
[im 52/98  soft-tissue]
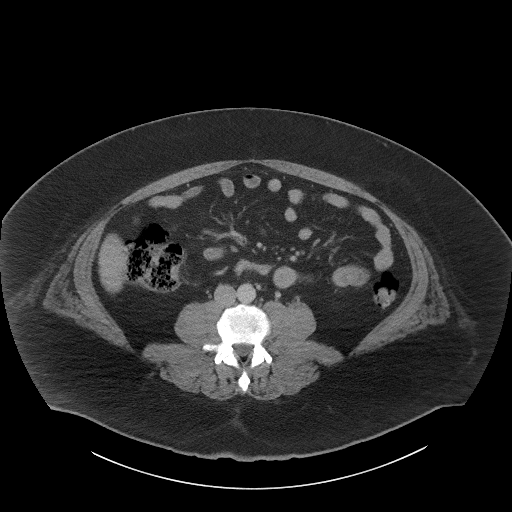
[im 57/98  soft-tissue]
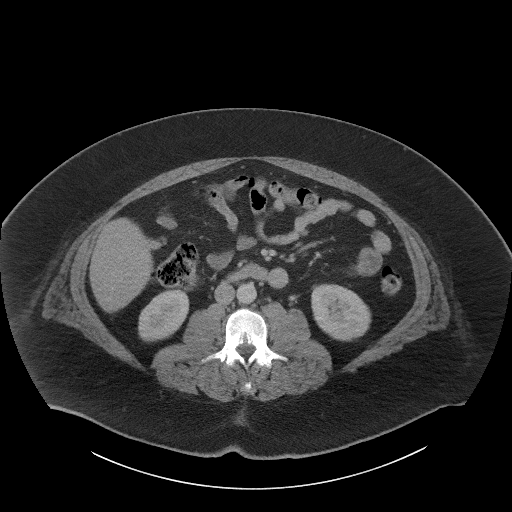
[im 62/98  soft-tissue]
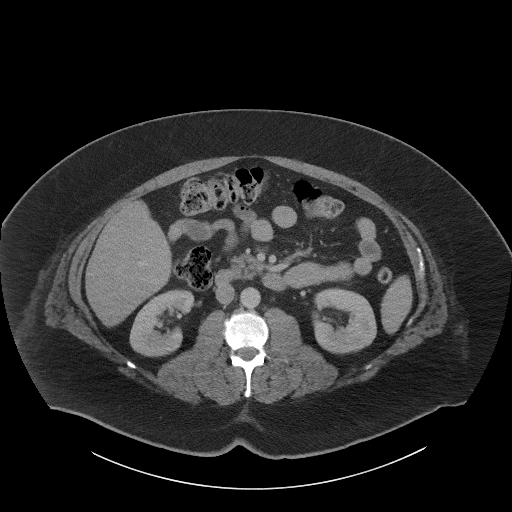
[im 62/98  bone]
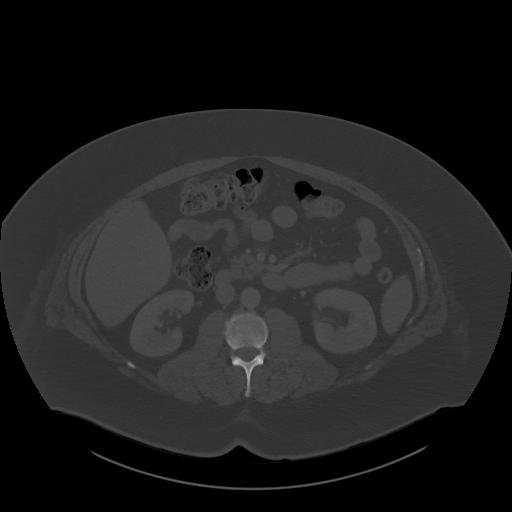
[im 72/98  soft-tissue]
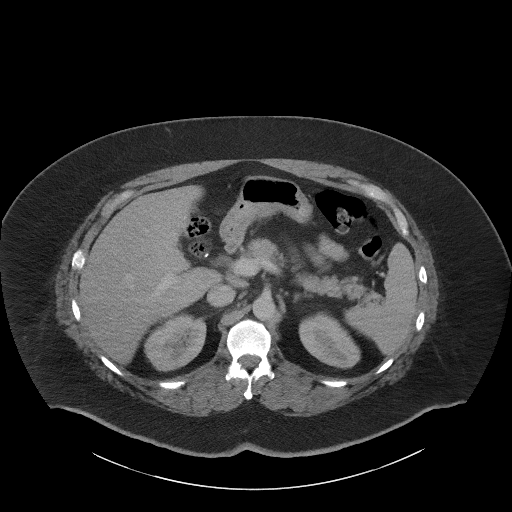
[im 77/98  soft-tissue]
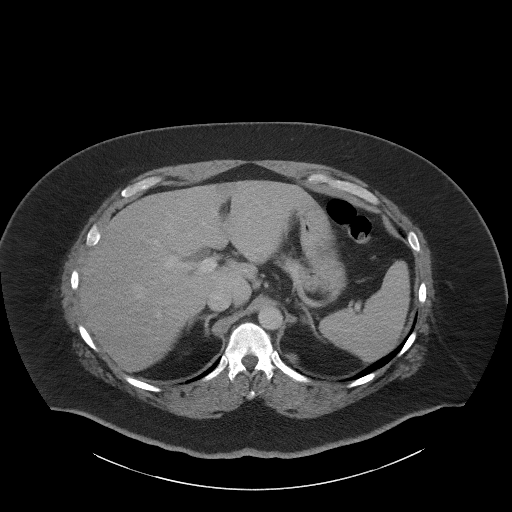
[im 82/98  soft-tissue]
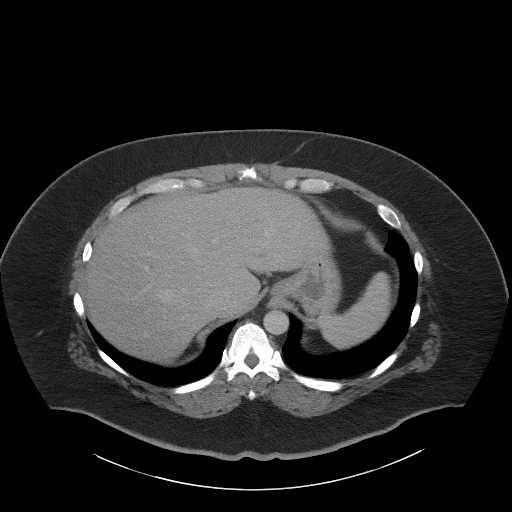
[im 92/98  soft-tissue]
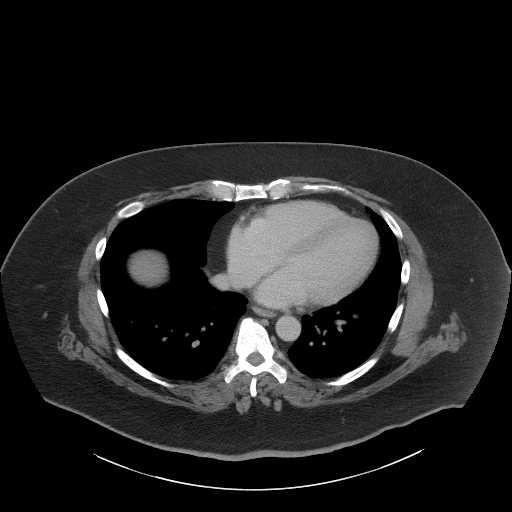

[Series 5: coronal st · coronal · 0.99mm/px · 3 of 114 slices shown]
[im 38/114  soft-tissue]
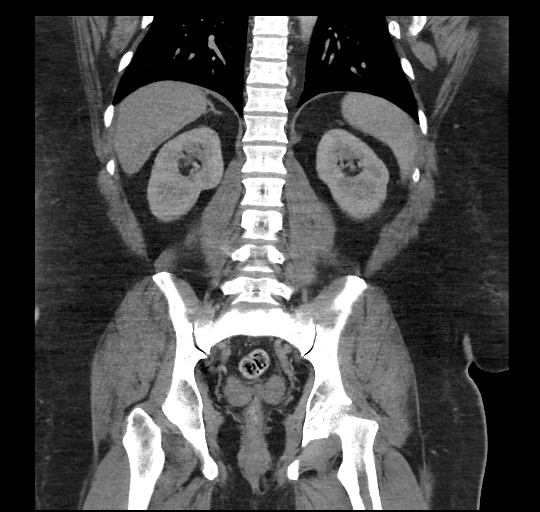
[im 51/114  soft-tissue]
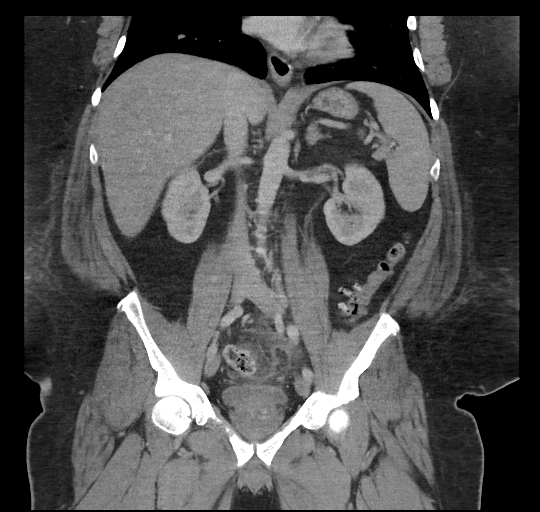
[im 63/114  soft-tissue]
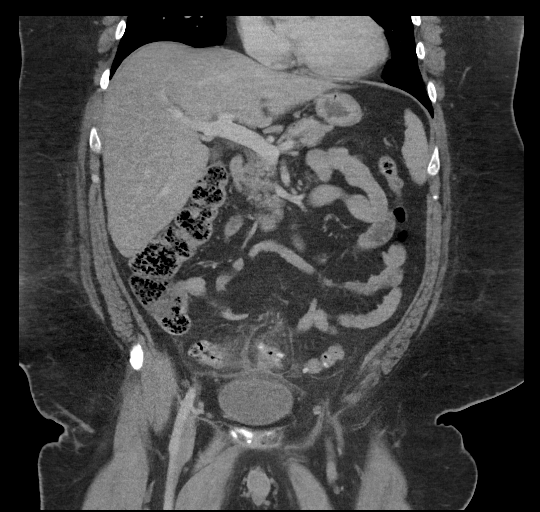

[16 of 46 positions shown; findings below may reference images not displayed]

FINDINGS: Lower chest: Lung bases are clear.

Hepatobiliary: Prior cholecystectomy. 8 mm calcification projects
over the common bile duct. No significant intrahepatic ductal
dilatation.

Pancreas: Normal.

Spleen: Normal.

Adrenals/Urinary Tract: Adrenal glands are normal. Kidneys are
normal in size and demonstrate several punctate bilateral stones. No
evidence of hydronephrosis. No focal mass. Ureters and bladder are
normal.

Stomach/Bowel: Stomach and small bowel are normal. Appendix is
normal. There is diverticulosis of the colon most prominent over the
sigmoid colon. There is moderate adjacent inflammatory change over
the sigmoid colon in the midline pelvis with minimal adjacent free
fluid as findings are compatible with acute diverticulitis. No
evidence of free peritoneal air. Small fluid collection abutting the
inflamed diverticula likely phlegmon, although may represent
evolving diverticular abscess.

Vascular/Lymphatic: Abdominal aorta is normal. No significant
adenopathy.

Reproductive: Normal.

Other: None.

Musculoskeletal: No focal abnormality.
IMPRESSION: 1. Colonic diverticulosis with evidence of acute diverticulitis
involving the sigmoid colon in the midline pelvis. Small fluid
collection abutting the inflamed diverticula likely phlegmon,
although may represent evolving diverticular abscess. No evidence of
free peritoneal air.
2. Bilateral nephrolithiasis.
3. 8 mm stone over the common bile duct without significant
intrahepatic ductal dilatation. Prior cholecystectomy.

## 2021-09-20 ENCOUNTER — Encounter: Payer: Self-pay | Admitting: Gastroenterology

## 2021-09-20 ENCOUNTER — Telehealth: Payer: Self-pay | Admitting: General Surgery

## 2021-09-20 ENCOUNTER — Other Ambulatory Visit: Payer: Self-pay

## 2021-09-20 ENCOUNTER — Ambulatory Visit (INDEPENDENT_AMBULATORY_CARE_PROVIDER_SITE_OTHER): Payer: BC Managed Care – PPO | Admitting: Gastroenterology

## 2021-09-20 VITALS — BP 110/72 | HR 67 | Ht 66.0 in | Wt 286.0 lb

## 2021-09-20 DIAGNOSIS — K5792 Diverticulitis of intestine, part unspecified, without perforation or abscess without bleeding: Secondary | ICD-10-CM | POA: Diagnosis not present

## 2021-09-20 NOTE — Telephone Encounter (Signed)
Referral sent to [facility] for patient to see a colorectal surgeon, fax transmittal succesful.

## 2021-09-20 NOTE — Patient Instructions (Addendum)
If you are age 51 or older, your body mass index should be between 23-30. Your Body mass index is 46.16 kg/m. If this is out of the aforementioned range listed, please consider follow up with your Primary Care Provider.  If you are age 30 or younger, your body mass index should be between 19-25. Your Body mass index is 46.16 kg/m. If this is out of the aformentioned range listed, please consider follow up with your Primary Care Provider.   __________________________________________________________  The [facility] providers would like to encourage you to use [facility] to communicate with providers for non-urgent requests or questions.  Due to long hold times on the telephone, sending your provider a message by [facility] may be a faster and more efficient way to get a response.  Please allow 48 business hours for a response.  Please remember that this is for non-urgent requests.   You will be scheduled for an appointment with a Colorectal surgeon at [facility]. . Make certain to bring a list of current medications, including any over the counter medications or vitamins. They will contact you within the next several weeks to schedule. If you do not hear from them within 2 weeks please contact us at [phone].   Due to recent changes in healthcare laws, you may see the results of your imaging and laboratory studies on MyChart before your provider has had a chance to review them.  We understand that in some cases there may be results that are confusing or concerning to you. Not all laboratory results come back in the same time frame and the provider may be waiting for multiple results in order to interpret others.  Please give [place] 48 hours in order for your provider to thoroughly review all the results before contacting the office for clarification of your results.   Thank you for choosing me and [facility].  Vito Cirigliano, D.O.

## 2021-09-20 NOTE — Progress Notes (Signed)
Chief Complaint:    Diverticulitis follow-up  GI History: 51 year old male with a history of diabetes, obesity, migraines, OSA (on BiPAP), hx of ccy, follows in the GI clinic for diverticulosis with acute recurrent diverticulitis.  Hospitalized 03/2021 with sigmoid diverticulitis with possible microperforation on CT.  That was his first episode of diverticulitis.  Treated with IV ABX.  Surgery consulted and recommended medical management.  Was discharged home with Cipro/Flagyl x10 days, probiotics - CT (03/14/2021): Diverticulosis with sigmoid diverticulitis and minimal adjacent free air with small phlegmon vs less likely evolving diverticular abscess.  No free air.  Normal liver, 8 mm calcification over CBD without duct dilatation/obstruction.  Prior ccy - Normal liver enzymes, WBC 11.8 which normalized by discharge.  Otherwise normal CBC - 05/04/2021: Initial evaluation GI clinic - 05/21/2021: Colonoscopy: 5 mm sigmoid polyp (path: [place]), Sigmoid diverticulosis, tortuous sigmoid colon and area of dense diverticular disease located 40-50 cm from anal verge.  Normal TI. - 07/25/2021: ER evaluation, diagnosed w/ sigmoid diverticulitis on CT.  Normal labs.  Discharged with Cipro/Flagyl - 08/12/2021: Patient called in with recurrence of LLQ pain.  Extended course of Cipro/Flagyl   Father had diverticulitis and polyps. Otherwise, no known family history of CRC, GI malignancy, liver disease, pancreatic disease, or IBD.   HPI:     Patient is a 51 y.o. male presenting to the Gastroenterology Clinic for follow-up.  Again diagnosed with sigmoid diverticulitis by CT in 07/2021.  Treated with extended course of Cipro/Flagyl as outlined above.  Completed ABX without issue.  Today, he states he is feeling better after the extended course of Abx as above. He is concerned about the recurrence of diverticulitis.  Otherwise tolerating p.o. intake without issue and no current complaints.   Review of systems:      No chest pain, no SOB, no fevers, no urinary sx   Past Medical History:  Diagnosis Date   Allergy    Anxiety    Diabetes mellitus without complication ([place])    WFUXNATF(573.2)    Hypertension    Kidney stones    Obesity    Sleep apnea    on BPAP since June    Patient's surgical history, family medical history, social history, medications and allergies were all reviewed in Epic    Current Outpatient Medications  Medication Sig Dispense Refill   acetaminophen (TYLENOL) 500 MG tablet Take 500-1,000 mg by mouth every 6 (six) hours as needed for headache (pain).     Calcium Carbonate (CALCIUM 600 PO) Take 600 mg by mouth every morning.     cetirizine (ZYRTEC) 10 MG tablet Take 10 mg by mouth daily as needed (seasonal allergies).     cyanocobalamin 2000 MCG tablet Take 2,000 mcg by mouth every morning. Vitamin B12     metFORMIN (GLUCOPHAGE) 500 MG tablet TAKE 1 TABLET DAILY WITH   BREAKFAST 90 tablet 3   PRESCRIPTION MEDICATION Inhale into the lungs at bedtime. BIPAP     triamcinolone (NASACORT) 55 MCG/ACT AERO nasal inhaler Place 2 sprays into the nose at bedtime as needed (seasonal allergies).     No current facility-administered medications for this visit.    Physical Exam:     BP 110/72    Pulse 67    Ht 5\' 6"  (1.676 m)    Wt 286 lb (129.7 kg)    SpO2 95%    BMI 46.16 kg/m   GENERAL:  Pleasant male in NAD PSYCH: : Cooperative, normal affect EENT:  conjunctiva pink, mucous  membranes moist, neck supple without masses ABDOMEN:  Nondistended, soft, nontender. No obvious masses, no hepatomegaly,  normal bowel sounds SKIN:  turgor, no lesions seen Musculoskeletal:  Normal muscle tone, normal strength NEURO: Alert and oriented x 3, no focal neurologic deficits   IMPRESSION and PLAN:    1) Diverticulosis with acute recurrent diverticulitis Has now had 2 documented episodes of sigmoid diverticulitis seen on CT, and a third episode clinically diagnosed.  Discussed acute recurrent  diverticulitis with patient at length today and he would like surgical evaluation for possible segmental resection, which is very reasonable. - Referral to Colorectal Surgery -To call the GI clinic if another episode of diverticulitis which could again require Abx  I spent 22 minutes of time, including independent review of results as outlined above, communicating results with the patient directly, face-to-face time with the patient, coordinating care, ordering studies and medications as appropriate, and documentation.         [place] ,DO, FACG 09/20/2021, 10:18 AM

## 2021-09-23 DIAGNOSIS — H9203 Otalgia, bilateral: Secondary | ICD-10-CM | POA: Diagnosis not present

## 2021-09-23 DIAGNOSIS — R519 Headache, unspecified: Secondary | ICD-10-CM | POA: Diagnosis not present

## 2021-09-23 DIAGNOSIS — R42 Dizziness and giddiness: Secondary | ICD-10-CM | POA: Diagnosis not present

## 2021-09-23 DIAGNOSIS — Z20822 Contact with and (suspected) exposure to covid-19: Secondary | ICD-10-CM | POA: Diagnosis not present

## 2021-09-30 ENCOUNTER — Encounter: Payer: Self-pay | Admitting: Family Medicine

## 2021-09-30 ENCOUNTER — Other Ambulatory Visit: Payer: Self-pay

## 2021-09-30 ENCOUNTER — Ambulatory Visit (INDEPENDENT_AMBULATORY_CARE_PROVIDER_SITE_OTHER): Payer: BC Managed Care – PPO | Admitting: Family Medicine

## 2021-09-30 VITALS — BP 116/53 | HR 63 | Temp 98.0°F | Ht 66.0 in | Wt 285.0 lb

## 2021-09-30 DIAGNOSIS — E1169 Type 2 diabetes mellitus with other specified complication: Secondary | ICD-10-CM | POA: Diagnosis not present

## 2021-09-30 DIAGNOSIS — K5792 Diverticulitis of intestine, part unspecified, without perforation or abscess without bleeding: Secondary | ICD-10-CM | POA: Diagnosis not present

## 2021-09-30 DIAGNOSIS — E669 Obesity, unspecified: Secondary | ICD-10-CM

## 2021-09-30 LAB — POCT GLYCOSYLATED HEMOGLOBIN (HGB A1C): Hemoglobin A1C: 5.5 % (ref 4.0–5.6)

## 2021-09-30 NOTE — Progress Notes (Signed)
Roberto Charles - 51 y.o. male MRN [id number]  Date of birth: 1971/01/03  Subjective Chief Complaint  Patient presents with   Diabetes    HPI Roberto Charles is a 51 year old male here today for follow-up visit.  Since his last visit he has had recurrent episodes of diverticulitis.  Followed by GI and has recently been referred to colorectal surgery to discuss resection of diseased portion of colon due to recurrence.  Reports blood sugars have been well controlled.  Currently taking metformin but would like to get off of this.  He has not had any side effects with this.   ROS:  A comprehensive ROS was completed and negative except as noted per HPI  Allergies  Allergen Reactions   Amoxicillin Other (See Comments)    Causes blood in intestines- Had once incidence of bloody diarrhea but thinks he has tolerated since this incidence     Past Medical History:  Diagnosis Date   Allergy    Anxiety    Diabetes mellitus without complication ([place])    FHLKTGYB(638.9)    Hypertension    Kidney stones    Obesity    Sleep apnea    on BPAP since June    Past Surgical History:  Procedure Laterality Date   [place]  2010    Social History   Socioeconomic History   Marital status: Married    Spouse name: Not on file   Number of children: 2   Years of education: Not on file   Highest education level: Not on file  Occupational History   Occupation: Insurance  Tobacco Use   Smoking status: Never   Smokeless tobacco: Never  Vaping Use   Vaping Use: Never used  Substance and Sexual Activity   Alcohol use: Not Currently    Alcohol/week: 2.0 standard drinks    Types: 2 Standard drinks or equivalent per week    Comment: 2 X PER MONTH   Drug use: No   Sexual activity: Yes    Partners: Female    Birth control/protection: Other-see comments    Comment: Vasectomy  Other Topics Concern   Not on file  Social History Narrative   Not on  file   Social Determinants of Health   Financial Resource Strain: Not on file  Food Insecurity: Not on file  Transportation Needs: Not on file  Physical Activity: Not on file  Stress: Not on file  Social Connections: Not on file    Family History  Problem Relation Age of Onset   Hypertension Father    Colon polyps Father    Uterine cancer Maternal Aunt    Testicular cancer Paternal Uncle    Uterine cancer Maternal Grandmother    Colon cancer Neg Hx    Pancreatic cancer Neg Hx    Stomach cancer Neg Hx    Liver cancer Neg Hx    Esophageal cancer Neg Hx    Rectal cancer Neg Hx     Health Maintenance  Topic Date Due   OPHTHALMOLOGY EXAM  Never done   URINE MICROALBUMIN  Never done   Hepatitis C Screening  Never done   INFLUENZA VACCINE  12/03/2021 (Originally 04/05/2021)   Zoster Vaccines- Shingrix (1 of 2) 12/29/2021 (Originally 07/15/2021)   HEMOGLOBIN A1C  03/30/2022   FOOT EXAM  09/30/2022   TETANUS/TDAP  01/15/2028   COLONOSCOPY (Pts 45-53yrs Insurance coverage will need to be confirmed)  05/22/2031   COVID-19  Vaccine  Completed   HIV Screening  Completed   HPV VACCINES  Aged Out     ----------------------------------------------------------------------------------------------------------------------------------------------------------------------------------------------------------------- Physical Exam BP (!) 116/53 (BP Location: Left Arm, Patient Position: Sitting, Cuff Size: Large)    Pulse 63    Temp 98 F (36.7 C)    Ht 5\' 6"  (1.676 m)    Wt 285 lb (129.3 kg)    SpO2 98%    BMI 46.00 kg/m   Physical Exam Constitutional:      Appearance: Normal appearance.  Eyes:     General: No scleral icterus. Cardiovascular:     Rate and Rhythm: Normal rate and regular rhythm.  Pulmonary:     Effort: Pulmonary effort is normal.     Breath sounds: Normal breath sounds.  Musculoskeletal:     Cervical back: Neck supple.  Neurological:     General: No focal deficit  present.     Mental Status: He is alert.  Psychiatric:        Mood and Affect: Mood normal.        Behavior: Behavior normal.    ------------------------------------------------------------------------------------------------------------------------------------------------------------------------------------------------------------------- Assessment and Plan  Diabetes mellitus type 2 in obese (HCC) Blood sugars remain well controlled.  A1c at 5.5% today.  Discussed that he may discontinue metformin and continue with dietary change.  Diverticulitis Has upcoming visit with colorectal surgeon due to recurrence of diverticulitis.   No orders of the defined types were placed in this encounter.   Return in about 6 months (around 03/30/2022) for T2Dm.    This visit occurred during the SARS-CoV-2 public health emergency.  Safety protocols were in place, including screening questions prior to the visit, additional usage of staff PPE, and extensive cleaning of exam room while observing appropriate contact time as indicated for disinfecting solutions.  -Status sorry mom went off.

## 2021-09-30 NOTE — Assessment & Plan Note (Signed)
Has upcoming visit with colorectal surgeon due to recurrence of diverticulitis.

## 2021-09-30 NOTE — Assessment & Plan Note (Signed)
Blood sugars remain well controlled.  A1c at 5.5% today.  Discussed that he may discontinue metformin and continue with dietary change.

## 2021-09-30 NOTE — Patient Instructions (Signed)
You may discontinue metformin.  Continue to be consistent with dietary change.  See me again in 6 months or sooner if needed

## 2021-10-03 DIAGNOSIS — G4733 Obstructive sleep apnea (adult) (pediatric): Secondary | ICD-10-CM | POA: Diagnosis not present

## 2021-10-04 ENCOUNTER — Encounter: Payer: Self-pay | Admitting: Gastroenterology

## 2021-10-08 ENCOUNTER — Encounter: Payer: Self-pay | Admitting: Family Medicine

## 2021-11-02 DIAGNOSIS — G4733 Obstructive sleep apnea (adult) (pediatric): Secondary | ICD-10-CM | POA: Diagnosis not present

## 2021-11-09 DIAGNOSIS — K5732 Diverticulitis of large intestine without perforation or abscess without bleeding: Secondary | ICD-10-CM | POA: Diagnosis not present

## 2021-11-19 DIAGNOSIS — R0789 Other chest pain: Secondary | ICD-10-CM | POA: Diagnosis not present

## 2021-11-19 DIAGNOSIS — R519 Headache, unspecified: Secondary | ICD-10-CM | POA: Diagnosis not present

## 2021-11-19 DIAGNOSIS — G4489 Other headache syndrome: Secondary | ICD-10-CM | POA: Diagnosis not present

## 2021-11-19 DIAGNOSIS — R079 Chest pain, unspecified: Secondary | ICD-10-CM | POA: Diagnosis not present

## 2021-11-19 DIAGNOSIS — R42 Dizziness and giddiness: Secondary | ICD-10-CM | POA: Diagnosis not present

## 2021-11-20 DIAGNOSIS — R079 Chest pain, unspecified: Secondary | ICD-10-CM | POA: Diagnosis not present

## 2021-11-22 ENCOUNTER — Telehealth: Payer: Self-pay | Admitting: General Practice

## 2021-11-22 NOTE — Telephone Encounter (Signed)
Transition Care Management Unsuccessful Follow-up Telephone Call ? ?Date of discharge and from where:  11/19/21 from [facility] ? ?Attempts:  1st Attempt ? ?Reason for unsuccessful TCM follow-up call:  Left voice message ? ?  ?

## 2021-11-24 NOTE — Telephone Encounter (Signed)
Transition Care Management Unsuccessful Follow-up Telephone Call ? ?Date of discharge and from where:  11/19/21 from [facility] ? ?Attempts:  2nd Attempt ? ?Reason for unsuccessful TCM follow-up call:  Left voice message ? ?  ?

## 2021-11-26 NOTE — Telephone Encounter (Signed)
Transition Care Management Unsuccessful Follow-up Telephone Call ? ?Date of discharge and from where:  11/19/21 from [facility] ? ?Attempts:  3rd Attempt ? ?Reason for unsuccessful TCM follow-up call:  Left voice message ? ?  ?

## 2021-12-01 ENCOUNTER — Other Ambulatory Visit (HOSPITAL_COMMUNITY): Payer: Self-pay

## 2021-12-01 NOTE — Progress Notes (Signed)
Surgery orders requested via Epic inbox. °

## 2021-12-02 ENCOUNTER — Ambulatory Visit: Payer: Self-pay | Admitting: Surgery

## 2021-12-02 DIAGNOSIS — Z01818 Encounter for other preprocedural examination: Secondary | ICD-10-CM

## 2021-12-02 NOTE — Progress Notes (Addendum)
Anesthesia Review: ? ?PCP: LOV - 09/30/21 Luetta Nutting ?Neuro- 07/27/21- LOV Amy Teryl Lucy  ?Cardiologist : Minna Merritts- LOV 12/14/20  ?Seen by cardologist for ? Stroke- 11/2020- turned out to be some type of migraine.  PT reports he hadto wear a heart monitor which was negative.   ?Chest x-ray : ?EKG : 12/06/21  ?Echo : ?Stress test: ?Cardiac Cath :  ?Activity level:  ?Sleep Study/ CPAP : ?Fasting Blood Sugar :      / Checks Blood Sugar -- times a day:   ?Blood Thinner/ Instructions /Last Dose: ?ASA / Instructions/ Last Dose :   ?In ED on 11/19/21 for left sided chest pain.   ?PT was 35 minutes late for preop appt.   ?AT preop pt states he is no longer prediabetes.  Taken off of meds 09/2021.  Blood pressure at preop appt was 147/96.  PT denies any chest pain, shortness of breath, dizziness, headache or blurred vision.  Ekg done. Instructed pt to monitor blood pressure and to notify PCP.  PT voiced understanding.  ?CBC and CMP- done 11/19/21- in epic.   ?09/30/21-hgba1c-5.5  ?

## 2021-12-02 NOTE — Progress Notes (Addendum)
DUE TO COVID-19 ONLY 2  VISITOR IS ALLOWED TO COME WITH YOU AND STAY IN THE WAITING ROOM ONLY DURING PRE OP AND PROCEDURE DAY OF SURGERY.  4  VISITOR  MAY VISIT WITH YOU AFTER SURGERY IN YOUR PRIVATE ROOM DURING VISITING HOURS ONLY! ?YOU MAY HAVE ONE PERSON SPEND THE NITE WITH YOU IN YOUR ROOM AFTER SURGERY.   ? ?G.  ? ? Your procedure is scheduled on:  ?       12/23/21.  ? Report to [facility] Main  Entrance ? ? Report to admitting at     1015             AM ?DO NOT BRING INSURANCE CARD, PICTURE ID OR WALLET DAY OF SURGERY.  ?  ? ? Call this number if you have problems the morning of surgery [phone]  ?Clear liquid diet on day of bowel prep.   ?Follow bowel prep instructions.  ? ? ? REMEMBER: NO  SOLID FOODS , CANDY, GUM OR MINTS AFTER MIDNITE THE NITE BEFORE SURGERY .     Drink 2 Ensure preop drinks at 1000pm nite before surgery    . CLEAR LIQUIDS UNTIL      0930am             DAY OF SURGERY.      PLEASE FINISH ENSURE DRINK PER SURGEON ORDER  WHICH NEEDS TO BE COMPLETED AT   0930am        MORNING OF SURGERY.   ? ? ? ? ?CLEAR LIQUID DIET ? ? ?Foods Allowed      ?WATER ?BLACK COFFEE ( SUGAR OK, NO MILK, CREAM OR CREAMER) REGULAR AND DECAF  ?TEA ( SUGAR OK NO MILK, CREAM, OR CREAMER) REGULAR AND DECAF  ?PLAIN JELLO ( NO RED)  ?FRUIT ICES ( NO RED, NO FRUIT PULP)  ?POPSICLES ( NO RED)  ?JUICE- APPLE, WHITE GRAPE AND WHITE CRANBERRY  ?SPORT DRINK LIKE GATORADE ( NO RED)  ?CLEAR BROTH ( VEGETABLE , CHICKEN OR BEEF)                                                               ? ?    ? ?BRUSH YOUR TEETH MORNING OF SURGERY AND RINSE YOUR MOUTH OUT, NO CHEWING GUM CANDY OR MINTS. ?  ? ? Take these medicines the morning of surgery with A SIP OF WATER:  zyrtec, Inhalers as usual and bring.  ? ? ?DO NOT TAKE ANY DIABETIC MEDICATIONS DAY OF YOUR SURGERY ?                  ?            You may not have any metal on your body including hair pins and  ?            piercings  Do not wear jewelry, make-up, lotions,  powders or perfumes, deodorant ?            Do not wear nail polish on your fingernails.   ?           IF YOU ARE A MALE AND WANT TO SHAVE UNDER ARMS OR LEGS PRIOR TO SURGERY YOU MUST DO SO AT LEAST 48 HOURS PRIOR TO SURGERY.  ?  Men may shave face and neck. ? ? Do not bring valuables to the hospital. [facility] NOT ?            RESPONSIBLE   FOR VALUABLES. ? Contacts, dentures or bridgework may not be worn into surgery. ? Leave suitcase in the car. After surgery it may be brought to your room. ? ?  ? Patients discharged the day of surgery will not be allowed to drive home. IF YOU ARE HAVING SURGERY AND GOING HOME THE SAME DAY, YOU MUST HAVE AN ADULT TO DRIVE YOU HOME AND BE WITH YOU FOR 24 HOURS. YOU MAY GO HOME BY TAXI OR UBER OR ORTHERWISE, BUT AN ADULT MUST ACCOMPANY YOU HOME AND STAY WITH YOU FOR 24 HOURS. ?  ? ?            Please read over the following fact sheets you were given: ?_____________________________________________________________________ ? ?[facility] - Preparing for Surgery ?Before surgery, you can play an important role.  Because skin is not sterile, your skin needs to be as free of germs as possible.  You can reduce the number of germs on your skin by washing with CHG (chlorahexidine gluconate) soap before surgery.  CHG is an antiseptic cleaner which kills germs and bonds with the skin to continue killing germs even after washing. ?Please DO NOT use if you have an allergy to CHG or antibacterial soaps.  If your skin becomes reddened/irritated stop using the CHG and inform your nurse when you arrive at Short Stay. ?Do not shave (including legs and underarms) for at least 48 hours prior to the first CHG shower.  You may shave your face/neck. ?Please follow these instructions carefully: ? 1.  Shower with CHG Soap the night before surgery and the  morning of Surgery. ? 2.  If you choose to wash your hair, wash your hair first as usual with your  normal  shampoo. ? 3.  After you  shampoo, rinse your hair and body thoroughly to remove the  shampoo.                           4.  Use CHG as you would any other liquid soap.  You can apply chg directly  to the skin and wash  ?                     Gently with a scrungie or clean washcloth. ? 5.  Apply the CHG Soap to your body ONLY FROM THE NECK DOWN.   Do not use on face/ open      ?                     Wound or open sores. Avoid contact with eyes, ears mouth and genitals (private parts).  ?                     Production manager,  Genitals (private parts) with your normal soap. ?            6.  Wash thoroughly, paying special attention to the area where your surgery  will be performed. ? 7.  Thoroughly rinse your body with warm water from the neck down. ? 8.  DO NOT shower/wash with your normal soap after using and rinsing off  the CHG Soap. ?               9.  Pat yourself dry with a clean towel. ?           10.  Wear clean pajamas. ?           11.  Place clean sheets on your bed the night of your first shower and do not  sleep with pets. ?Day of Surgery : ?Do not apply any lotions/deodorants the morning of surgery.  Please wear clean clothes to the hospital/surgery center. ? ?FAILURE TO FOLLOW THESE INSTRUCTIONS MAY RESULT IN THE CANCELLATION OF YOUR SURGERY ?PATIENT SIGNATURE_________________________________ ? ?NURSE SIGNATURE__________________________________ ? ?________________________________________________________________________  ? ? ?           ?

## 2021-12-03 ENCOUNTER — Other Ambulatory Visit (HOSPITAL_COMMUNITY): Payer: Self-pay

## 2021-12-06 ENCOUNTER — Encounter (HOSPITAL_COMMUNITY): Payer: Self-pay

## 2021-12-06 ENCOUNTER — Other Ambulatory Visit: Payer: Self-pay

## 2021-12-06 ENCOUNTER — Encounter (HOSPITAL_COMMUNITY)
Admission: RE | Admit: 2021-12-06 | Discharge: 2021-12-06 | Disposition: A | Discharge status: Home / Self Care | Payer: BC Managed Care – PPO | Source: Ambulatory Visit | Attending: Surgery | Admitting: Surgery

## 2021-12-06 ENCOUNTER — Telehealth: Payer: Self-pay

## 2021-12-06 VITALS — BP 147/96 | HR 63 | Temp 97.9°F | Resp 16 | Ht 66.5 in | Wt 288.0 lb

## 2021-12-06 DIAGNOSIS — Z01810 Encounter for preprocedural cardiovascular examination: Secondary | ICD-10-CM | POA: Diagnosis not present

## 2021-12-06 DIAGNOSIS — Z01818 Encounter for other preprocedural examination: Secondary | ICD-10-CM

## 2021-12-06 HISTORY — DX: Gastro-esophageal reflux disease without esophagitis: K21.9

## 2021-12-06 HISTORY — DX: Personal history of urinary calculi: Z87.442

## 2021-12-06 NOTE — Telephone Encounter (Signed)
Pt lvm stating he was concerned about his BP. At pre-op appt today BP was 147/90.  A few weeks ago he went to the ER and BP was elevated but he attributes that to increased anxiety. ? ?Please advise.  ?

## 2021-12-08 NOTE — Telephone Encounter (Signed)
Attempted to contact the patient. LVM advising him recommendations were sent to his MyChart message.  ?

## 2021-12-23 ENCOUNTER — Encounter (HOSPITAL_COMMUNITY)
Admission: RE | Disposition: A | Discharge status: Home / Self Care | Payer: Self-pay | Source: Ambulatory Visit | Attending: Surgery

## 2021-12-23 ENCOUNTER — Other Ambulatory Visit: Payer: Self-pay

## 2021-12-23 ENCOUNTER — Inpatient Hospital Stay (HOSPITAL_COMMUNITY): Payer: BC Managed Care – PPO | Admitting: Anesthesiology

## 2021-12-23 ENCOUNTER — Inpatient Hospital Stay (HOSPITAL_COMMUNITY)
Admission: RE | Admit: 2021-12-23 | Discharge: 2021-12-26 | DRG: 330 | Disposition: A | Discharge status: Home / Self Care | Payer: BC Managed Care – PPO | Source: Ambulatory Visit | Attending: Surgery | Admitting: Surgery

## 2021-12-23 ENCOUNTER — Inpatient Hospital Stay (HOSPITAL_COMMUNITY): Payer: BC Managed Care – PPO | Admitting: Physician Assistant

## 2021-12-23 ENCOUNTER — Encounter (HOSPITAL_COMMUNITY): Payer: Self-pay | Admitting: Surgery

## 2021-12-23 DIAGNOSIS — Z880 Allergy status to penicillin: Secondary | ICD-10-CM

## 2021-12-23 DIAGNOSIS — G4733 Obstructive sleep apnea (adult) (pediatric): Secondary | ICD-10-CM | POA: Diagnosis present

## 2021-12-23 DIAGNOSIS — K5792 Diverticulitis of intestine, part unspecified, without perforation or abscess without bleeding: Secondary | ICD-10-CM | POA: Diagnosis not present

## 2021-12-23 DIAGNOSIS — K219 Gastro-esophageal reflux disease without esophagitis: Secondary | ICD-10-CM | POA: Diagnosis present

## 2021-12-23 DIAGNOSIS — Z01818 Encounter for other preprocedural examination: Secondary | ICD-10-CM

## 2021-12-23 DIAGNOSIS — Z9049 Acquired absence of other specified parts of digestive tract: Secondary | ICD-10-CM

## 2021-12-23 DIAGNOSIS — Z6841 Body Mass Index (BMI) 40.0 and over, adult: Secondary | ICD-10-CM

## 2021-12-23 DIAGNOSIS — K5730 Diverticulosis of large intestine without perforation or abscess without bleeding: Secondary | ICD-10-CM | POA: Diagnosis not present

## 2021-12-23 DIAGNOSIS — E119 Type 2 diabetes mellitus without complications: Secondary | ICD-10-CM | POA: Diagnosis not present

## 2021-12-23 DIAGNOSIS — K5732 Diverticulitis of large intestine without perforation or abscess without bleeding: Secondary | ICD-10-CM | POA: Diagnosis not present

## 2021-12-23 DIAGNOSIS — J302 Other seasonal allergic rhinitis: Secondary | ICD-10-CM | POA: Diagnosis present

## 2021-12-23 DIAGNOSIS — E782 Mixed hyperlipidemia: Secondary | ICD-10-CM | POA: Diagnosis not present

## 2021-12-23 DIAGNOSIS — E1169 Type 2 diabetes mellitus with other specified complication: Secondary | ICD-10-CM | POA: Diagnosis present

## 2021-12-23 DIAGNOSIS — Z79899 Other long term (current) drug therapy: Secondary | ICD-10-CM | POA: Diagnosis not present

## 2021-12-23 DIAGNOSIS — J3089 Other allergic rhinitis: Secondary | ICD-10-CM | POA: Diagnosis present

## 2021-12-23 DIAGNOSIS — G4730 Sleep apnea, unspecified: Secondary | ICD-10-CM | POA: Diagnosis not present

## 2021-12-23 DIAGNOSIS — F419 Anxiety disorder, unspecified: Secondary | ICD-10-CM | POA: Diagnosis not present

## 2021-12-23 HISTORY — PX: FLEXIBLE SIGMOIDOSCOPY: SHX5431

## 2021-12-23 HISTORY — PX: XI ROBOTIC ASSISTED LOWER ANTERIOR RESECTION: SHX6558

## 2021-12-23 LAB — CBC
HCT: 40.5 % (ref 39.0–52.0)
Hemoglobin: 14 g/dL (ref 13.0–17.0)
MCH: 28 pg (ref 26.0–34.0)
MCHC: 34.6 g/dL (ref 30.0–36.0)
MCV: 81 fL (ref 80.0–100.0)
Platelets: 174 10*3/uL (ref 150–400)
RBC: 5 MIL/uL (ref 4.22–5.81)
RDW: 14.2 % (ref 11.5–15.5)
WBC: 6 10*3/uL (ref 4.0–10.5)
nRBC: 0 % (ref 0.0–0.2)

## 2021-12-23 LAB — TYPE AND SCREEN
ABO/RH(D): A POS
Antibody Screen: NEGATIVE

## 2021-12-23 LAB — ABO/RH: ABO/RH(D): A POS

## 2021-12-23 SURGERY — RESECTION, RECTUM, LOW ANTERIOR, ROBOT-ASSISTED
Anesthesia: General

## 2021-12-23 MED ORDER — HEPARIN SODIUM (PORCINE) 5000 UNIT/ML IJ SOLN
5000.0000 [IU] | Freq: Three times a day (TID) | INTRAMUSCULAR | Status: DC
  Administered 2021-12-23 – 2021-12-26 (×8): 5000 [IU] via SUBCUTANEOUS
  Filled 2021-12-23 (×7): qty 1

## 2021-12-23 MED ORDER — ENSURE SURGERY PO LIQD
237.0000 mL | Freq: Two times a day (BID) | ORAL | Status: DC
  Administered 2021-12-25 (×2): 237 mL via ORAL

## 2021-12-23 MED ORDER — LACTATED RINGERS IV SOLN: INTRAVENOUS | Status: DC | PRN

## 2021-12-23 MED ORDER — LACTATED RINGERS IV SOLN: INTRAVENOUS | Status: DC

## 2021-12-23 MED ORDER — BUPIVACAINE LIPOSOME 1.3 % IJ SUSP
INTRAMUSCULAR | Status: DC | PRN
  Administered 2021-12-23: 20 mL

## 2021-12-23 MED ORDER — ONDANSETRON HCL 4 MG/2ML IJ SOLN: 4.0000 mg | Freq: Four times a day (QID) | INTRAMUSCULAR | Status: DC | PRN

## 2021-12-23 MED ORDER — SUCCINYLCHOLINE CHLORIDE 200 MG/10ML IV SOSY
PREFILLED_SYRINGE | INTRAVENOUS | Status: DC | PRN
Start: 2021-12-23 — End: 2021-12-23
  Administered 2021-12-23: 180 mg via INTRAVENOUS

## 2021-12-23 MED ORDER — ROCURONIUM BROMIDE 10 MG/ML (PF) SYRINGE
PREFILLED_SYRINGE | INTRAVENOUS | Status: DC | PRN
  Administered 2021-12-23: 70 mg via INTRAVENOUS
  Administered 2021-12-23: 5 mg via INTRAVENOUS
  Administered 2021-12-23: 30 mg via INTRAVENOUS

## 2021-12-23 MED ORDER — ALBUMIN HUMAN 5 % IV SOLN: INTRAVENOUS | Status: DC | PRN

## 2021-12-23 MED ORDER — ALBUMIN HUMAN 5 % IV SOLN
INTRAVENOUS | Status: AC
  Filled 2021-12-23: qty 250

## 2021-12-23 MED ORDER — LORATADINE 10 MG PO TABS
10.0000 mg | ORAL_TABLET | Freq: Every day | ORAL | Status: DC
  Administered 2021-12-23 – 2021-12-26 (×4): 10 mg via ORAL
  Filled 2021-12-23 (×4): qty 1

## 2021-12-23 MED ORDER — SCOPOLAMINE 1 MG/3DAYS TD PT72
1.0000 | MEDICATED_PATCH | TRANSDERMAL | Status: DC
  Administered 2021-12-23: 1.5 mg via TRANSDERMAL
  Filled 2021-12-23: qty 1

## 2021-12-23 MED ORDER — MIDAZOLAM HCL 2 MG/2ML IJ SOLN
INTRAMUSCULAR | Status: DC | PRN
  Administered 2021-12-23: 2 mg via INTRAVENOUS

## 2021-12-23 MED ORDER — FENTANYL CITRATE (PF) 250 MCG/5ML IJ SOLN
INTRAMUSCULAR | Status: AC
  Filled 2021-12-23: qty 5

## 2021-12-23 MED ORDER — LIDOCAINE 2% (20 MG/ML) 5 ML SYRINGE
INTRAMUSCULAR | Status: DC | PRN
  Administered 2021-12-23: 100 mg via INTRAVENOUS

## 2021-12-23 MED ORDER — DIPHENHYDRAMINE HCL 50 MG/ML IJ SOLN: 12.5000 mg | Freq: Four times a day (QID) | INTRAMUSCULAR | Status: DC | PRN

## 2021-12-23 MED ORDER — LACTATED RINGERS IR SOLN
Status: DC | PRN
  Administered 2021-12-23: 1000 mL

## 2021-12-23 MED ORDER — ENSURE PRE-SURGERY PO LIQD
296.0000 mL | Freq: Once | ORAL | Status: DC
  Filled 2021-12-23: qty 296

## 2021-12-23 MED ORDER — SODIUM CHLORIDE 0.9 % IV BOLUS
500.0000 mL | Freq: Once | INTRAVENOUS | Status: AC
  Administered 2021-12-23: 500 mL via INTRAVENOUS

## 2021-12-23 MED ORDER — FENTANYL CITRATE PF 50 MCG/ML IJ SOSY
PREFILLED_SYRINGE | INTRAMUSCULAR | Status: AC
  Administered 2021-12-23: 50 ug via INTRAVENOUS
  Filled 2021-12-23: qty 3

## 2021-12-23 MED ORDER — HEPARIN SODIUM (PORCINE) 5000 UNIT/ML IJ SOLN
5000.0000 [IU] | Freq: Once | INTRAMUSCULAR | Status: AC
  Administered 2021-12-23: 5000 [IU] via SUBCUTANEOUS
  Filled 2021-12-23: qty 1

## 2021-12-23 MED ORDER — ACETAMINOPHEN 500 MG PO TABS: 1000.0000 mg | ORAL_TABLET | Freq: Once | ORAL | Status: DC

## 2021-12-23 MED ORDER — ALVIMOPAN 12 MG PO CAPS
12.0000 mg | ORAL_CAPSULE | Freq: Two times a day (BID) | ORAL | Status: DC
  Administered 2021-12-24 – 2021-12-25 (×3): 12 mg via ORAL
  Filled 2021-12-23 (×3): qty 1

## 2021-12-23 MED ORDER — LIDOCAINE 2% (20 MG/ML) 5 ML SYRINGE
INTRAMUSCULAR | Status: DC | PRN
  Administered 2021-12-23: 1.5 mg/kg/h via INTRAVENOUS

## 2021-12-23 MED ORDER — PHENYLEPHRINE 80 MCG/ML (10ML) SYRINGE FOR IV PUSH (FOR BLOOD PRESSURE SUPPORT)
PREFILLED_SYRINGE | INTRAVENOUS | Status: DC | PRN
  Administered 2021-12-23: 80 ug via INTRAVENOUS
  Administered 2021-12-23 (×2): 160 ug via INTRAVENOUS

## 2021-12-23 MED ORDER — MIDAZOLAM HCL 2 MG/2ML IJ SOLN
INTRAMUSCULAR | Status: AC
Start: 2021-12-23 — End: ?
  Filled 2021-12-23: qty 2

## 2021-12-23 MED ORDER — HYDROMORPHONE HCL 1 MG/ML IJ SOLN
0.5000 mg | INTRAMUSCULAR | Status: DC | PRN
  Administered 2021-12-23: 0.5 mg via INTRAVENOUS
  Filled 2021-12-23: qty 0.5

## 2021-12-23 MED ORDER — ONDANSETRON HCL 4 MG PO TABS: 4.0000 mg | ORAL_TABLET | Freq: Four times a day (QID) | ORAL | Status: DC | PRN

## 2021-12-23 MED ORDER — HYDRALAZINE HCL 20 MG/ML IJ SOLN: 10.0000 mg | INTRAMUSCULAR | Status: DC | PRN

## 2021-12-23 MED ORDER — SIMETHICONE 80 MG PO CHEW: 40.0000 mg | CHEWABLE_TABLET | Freq: Four times a day (QID) | ORAL | Status: DC | PRN

## 2021-12-23 MED ORDER — ONDANSETRON HCL 4 MG/2ML IJ SOLN
INTRAMUSCULAR | Status: AC
  Filled 2021-12-23: qty 2

## 2021-12-23 MED ORDER — CHLORHEXIDINE GLUCONATE 0.12 % MT SOLN
15.0000 mL | Freq: Once | OROMUCOSAL | Status: AC
  Administered 2021-12-23: 15 mL via OROMUCOSAL

## 2021-12-23 MED ORDER — PROPOFOL 10 MG/ML IV BOLUS
INTRAVENOUS | Status: DC | PRN
  Administered 2021-12-23: 200 mg via INTRAVENOUS

## 2021-12-23 MED ORDER — ACETAMINOPHEN 500 MG PO TABS
1000.0000 mg | ORAL_TABLET | Freq: Four times a day (QID) | ORAL | Status: DC
  Administered 2021-12-23 – 2021-12-26 (×11): 1000 mg via ORAL
  Filled 2021-12-23 (×11): qty 2

## 2021-12-23 MED ORDER — ROCURONIUM BROMIDE 10 MG/ML (PF) SYRINGE
PREFILLED_SYRINGE | INTRAVENOUS | Status: AC
  Filled 2021-12-23: qty 10

## 2021-12-23 MED ORDER — ENSURE PRE-SURGERY PO LIQD
592.0000 mL | Freq: Once | ORAL | Status: DC
  Filled 2021-12-23: qty 592

## 2021-12-23 MED ORDER — DEXAMETHASONE SODIUM PHOSPHATE 10 MG/ML IJ SOLN
INTRAMUSCULAR | Status: AC
  Filled 2021-12-23: qty 1

## 2021-12-23 MED ORDER — CELECOXIB 200 MG PO CAPS
200.0000 mg | ORAL_CAPSULE | Freq: Once | ORAL | Status: AC
  Administered 2021-12-23: 200 mg via ORAL
  Filled 2021-12-23: qty 1

## 2021-12-23 MED ORDER — SPY AGENT GREEN - (INDOCYANINE FOR INJECTION)
INTRAMUSCULAR | Status: DC | PRN
  Administered 2021-12-23: 1 mL via INTRAVENOUS

## 2021-12-23 MED ORDER — ALBUMIN HUMAN 5 % IV SOLN
INTRAVENOUS | Status: AC
Start: 2021-12-23 — End: ?
  Filled 2021-12-23: qty 250

## 2021-12-23 MED ORDER — CYCLOBENZAPRINE HCL 10 MG PO TABS
10.0000 mg | ORAL_TABLET | Freq: Two times a day (BID) | ORAL | Status: DC | PRN
  Filled 2021-12-23: qty 1

## 2021-12-23 MED ORDER — KETAMINE HCL 10 MG/ML IJ SOLN
INTRAMUSCULAR | Status: DC | PRN
Start: 2021-12-23 — End: 2021-12-23
  Administered 2021-12-23: 50 mg via INTRAVENOUS

## 2021-12-23 MED ORDER — DIPHENHYDRAMINE HCL 12.5 MG/5ML PO ELIX: 12.5000 mg | ORAL_SOLUTION | Freq: Four times a day (QID) | ORAL | Status: DC | PRN

## 2021-12-23 MED ORDER — CHLORHEXIDINE GLUCONATE CLOTH 2 % EX PADS: 6.0000 | MEDICATED_PAD | Freq: Once | CUTANEOUS | Status: DC

## 2021-12-23 MED ORDER — ACETAMINOPHEN 500 MG PO TABS
1000.0000 mg | ORAL_TABLET | ORAL | Status: AC
  Administered 2021-12-23: 1000 mg via ORAL
  Filled 2021-12-23: qty 2

## 2021-12-23 MED ORDER — FENTANYL CITRATE PF 50 MCG/ML IJ SOSY
25.0000 ug | PREFILLED_SYRINGE | INTRAMUSCULAR | Status: DC | PRN
  Administered 2021-12-23: 50 ug via INTRAVENOUS

## 2021-12-23 MED ORDER — LIDOCAINE HCL (PF) 2 % IJ SOLN
INTRAMUSCULAR | Status: AC
  Filled 2021-12-23: qty 5

## 2021-12-23 MED ORDER — SODIUM CHLORIDE (PF) 0.9 % IJ SOLN
INTRAMUSCULAR | Status: AC
  Filled 2021-12-23: qty 10

## 2021-12-23 MED ORDER — TRAMADOL HCL 50 MG PO TABS
50.0000 mg | ORAL_TABLET | Freq: Four times a day (QID) | ORAL | Status: DC | PRN
  Administered 2021-12-24 – 2021-12-26 (×5): 50 mg via ORAL
  Filled 2021-12-23 (×5): qty 1

## 2021-12-23 MED ORDER — ONDANSETRON HCL 4 MG/2ML IJ SOLN
INTRAMUSCULAR | Status: DC | PRN
  Administered 2021-12-23: 4 mg via INTRAVENOUS

## 2021-12-23 MED ORDER — AMISULPRIDE (ANTIEMETIC) 5 MG/2ML IV SOLN: 10.0000 mg | Freq: Once | INTRAVENOUS | Status: DC | PRN

## 2021-12-23 MED ORDER — ALUM & MAG HYDROXIDE-SIMETH 200-200-20 MG/5ML PO SUSP: 30.0000 mL | Freq: Four times a day (QID) | ORAL | Status: DC | PRN

## 2021-12-23 MED ORDER — ALVIMOPAN 12 MG PO CAPS
12.0000 mg | ORAL_CAPSULE | ORAL | Status: AC
  Administered 2021-12-23: 12 mg via ORAL
  Filled 2021-12-23: qty 1

## 2021-12-23 MED ORDER — BUPIVACAINE-EPINEPHRINE (PF) 0.25% -1:200000 IJ SOLN
INTRAMUSCULAR | Status: AC
  Filled 2021-12-23: qty 30

## 2021-12-23 MED ORDER — SUGAMMADEX SODIUM 500 MG/5ML IV SOLN
INTRAVENOUS | Status: AC
  Filled 2021-12-23: qty 5

## 2021-12-23 MED ORDER — PROPOFOL 10 MG/ML IV BOLUS
INTRAVENOUS | Status: AC
Start: 2021-12-23 — End: ?
  Filled 2021-12-23: qty 20

## 2021-12-23 MED ORDER — ORAL CARE MOUTH RINSE: 15.0000 mL | Freq: Once | OROMUCOSAL | Status: AC

## 2021-12-23 MED ORDER — DEXAMETHASONE SODIUM PHOSPHATE 10 MG/ML IJ SOLN
INTRAMUSCULAR | Status: DC | PRN
  Administered 2021-12-23: 10 mg via INTRAVENOUS

## 2021-12-23 MED ORDER — BUPIVACAINE LIPOSOME 1.3 % IJ SUSP
INTRAMUSCULAR | Status: AC
  Filled 2021-12-23: qty 20

## 2021-12-23 MED ORDER — SUGAMMADEX SODIUM 500 MG/5ML IV SOLN
INTRAVENOUS | Status: DC | PRN
  Administered 2021-12-23: 300 mg via INTRAVENOUS

## 2021-12-23 MED ORDER — SODIUM CHLORIDE 0.9 % IV SOLN
2.0000 g | INTRAVENOUS | Status: AC
  Administered 2021-12-23: 2 g via INTRAVENOUS
  Filled 2021-12-23: qty 2

## 2021-12-23 MED ORDER — IBUPROFEN 400 MG PO TABS
600.0000 mg | ORAL_TABLET | Freq: Four times a day (QID) | ORAL | Status: DC | PRN
  Administered 2021-12-24: 600 mg via ORAL
  Filled 2021-12-23: qty 1

## 2021-12-23 MED ORDER — FENTANYL CITRATE (PF) 250 MCG/5ML IJ SOLN
INTRAMUSCULAR | Status: DC | PRN
  Administered 2021-12-23: 50 ug via INTRAVENOUS
  Administered 2021-12-23: 100 ug via INTRAVENOUS
  Administered 2021-12-23: 25 ug via INTRAVENOUS
  Administered 2021-12-23: 50 ug via INTRAVENOUS
  Administered 2021-12-23: 25 ug via INTRAVENOUS

## 2021-12-23 MED ORDER — BUPIVACAINE LIPOSOME 1.3 % IJ SUSP: 20.0000 mL | Freq: Once | INTRAMUSCULAR | Status: DC

## 2021-12-23 MED ORDER — BUPIVACAINE-EPINEPHRINE (PF) 0.25% -1:200000 IJ SOLN
INTRAMUSCULAR | Status: DC | PRN
  Administered 2021-12-23: 30 mL via PERINEURAL

## 2021-12-23 MED ORDER — LIDOCAINE HCL 2 % IJ SOLN
INTRAMUSCULAR | Status: AC
  Filled 2021-12-23: qty 20

## 2021-12-23 MED ORDER — SODIUM CHLORIDE 0.9 % IR SOLN
Status: DC | PRN
  Administered 2021-12-23: 1000 mL

## 2021-12-23 SURGICAL SUPPLY — 123 items
ADH SKN CLS APL DERMABOND .7 (GAUZE/BANDAGES/DRESSINGS) ×1
APL PRP STRL LF DISP 70% ISPRP (MISCELLANEOUS) ×1
APPLIER CLIP 5 13 M/L LIGAMAX5 (MISCELLANEOUS)
APPLIER CLIP ROT 10 11.4 M/L (STAPLE)
APR CLP MED LRG 11.4X10 (STAPLE)
APR CLP MED LRG 5 ANG JAW (MISCELLANEOUS)
BAG COUNTER SPONGE SURGICOUNT (BAG) IMPLANT
BAG SPNG CNTER NS LX DISP (BAG)
BLADE EXTENDED COATED 6.5IN (ELECTRODE) ×2 IMPLANT
CANNULA REDUC XI 12-8 STAPL (CANNULA) ×2
CANNULA REDUCER 12-8 DVNC XI (CANNULA) ×1 IMPLANT
CELLS DAT CNTRL 66122 CELL SVR (MISCELLANEOUS) IMPLANT
CHLORAPREP W/TINT 26 (MISCELLANEOUS) ×2 IMPLANT
CLIP APPLIE 5 13 M/L LIGAMAX5 (MISCELLANEOUS) IMPLANT
CLIP APPLIE ROT 10 11.4 M/L (STAPLE) IMPLANT
CLIP LIGATING HEM O LOK PURPLE (MISCELLANEOUS) IMPLANT
CLIP LIGATING HEMO O LOK GREEN (MISCELLANEOUS) IMPLANT
COVER SURGICAL LIGHT HANDLE (MISCELLANEOUS) ×4 IMPLANT
COVER TIP SHEARS 8 DVNC (MISCELLANEOUS) ×1 IMPLANT
COVER TIP SHEARS 8MM DA VINCI (MISCELLANEOUS) ×2
DEFOGGER SCOPE WARMER CLEARIFY (MISCELLANEOUS) ×2 IMPLANT
DERMABOND ADVANCED (GAUZE/BANDAGES/DRESSINGS) ×1
DERMABOND ADVANCED .7 DNX12 (GAUZE/BANDAGES/DRESSINGS) IMPLANT
DEVICE TROCAR PUNCTURE CLOSURE (ENDOMECHANICALS) IMPLANT
DRAIN CHANNEL 19F RND (DRAIN) ×2 IMPLANT
DRAPE ARM DVNC X/XI (DISPOSABLE) ×4 IMPLANT
DRAPE COLUMN DVNC XI (DISPOSABLE) ×1 IMPLANT
DRAPE DA VINCI XI ARM (DISPOSABLE) ×8
DRAPE DA VINCI XI COLUMN (DISPOSABLE) ×2
DRAPE SURG IRRIG POUCH 19X23 (DRAPES) ×2 IMPLANT
DRSG OPSITE POSTOP 4X10 (GAUZE/BANDAGES/DRESSINGS) IMPLANT
DRSG OPSITE POSTOP 4X6 (GAUZE/BANDAGES/DRESSINGS) IMPLANT
DRSG OPSITE POSTOP 4X8 (GAUZE/BANDAGES/DRESSINGS) IMPLANT
DRSG TEGADERM 2-3/8X2-3/4 SM (GAUZE/BANDAGES/DRESSINGS) ×10 IMPLANT
DRSG TEGADERM 4X4.75 (GAUZE/BANDAGES/DRESSINGS) ×2 IMPLANT
ELECT REM PT RETURN 15FT ADLT (MISCELLANEOUS) ×2 IMPLANT
ENDOLOOP SUT PDS II  0 18 (SUTURE)
ENDOLOOP SUT PDS II 0 18 (SUTURE) IMPLANT
EVACUATOR SILICONE 100CC (DRAIN) ×2 IMPLANT
GAUZE SPONGE 2X2 8PLY STRL LF (GAUZE/BANDAGES/DRESSINGS) ×1 IMPLANT
GAUZE SPONGE 4X4 12PLY STRL (GAUZE/BANDAGES/DRESSINGS) IMPLANT
GLOVE BIO SURGEON STRL SZ7.5 (GLOVE) ×6 IMPLANT
GLOVE INDICATOR 8.0 STRL GRN (GLOVE) ×6 IMPLANT
GOWN STRL REUS W/ TWL XL LVL3 (GOWN DISPOSABLE) ×5 IMPLANT
GOWN STRL REUS W/TWL XL LVL3 (GOWN DISPOSABLE) ×10
GRASPER SUT TROCAR 14GX15 (MISCELLANEOUS) IMPLANT
HOLDER FOLEY CATH W/STRAP (MISCELLANEOUS) ×2 IMPLANT
IRRIG SUCT STRYKERFLOW 2 WTIP (MISCELLANEOUS) ×2
IRRIGATION SUCT STRKRFLW 2 WTP (MISCELLANEOUS) ×1 IMPLANT
KIT PROCEDURE DA VINCI SI (MISCELLANEOUS)
KIT PROCEDURE DVNC SI (MISCELLANEOUS) IMPLANT
KIT TURNOVER KIT A (KITS) IMPLANT
NDL INSUFFLATION 14GA 120MM (NEEDLE) ×1 IMPLANT
NEEDLE INSUFFLATION 14GA 120MM (NEEDLE) ×2 IMPLANT
PACK CARDIOVASCULAR III (CUSTOM PROCEDURE TRAY) ×2 IMPLANT
PACK COLON (CUSTOM PROCEDURE TRAY) ×2 IMPLANT
PAD POSITIONING PINK XL (MISCELLANEOUS) ×2 IMPLANT
PENCIL SMOKE EVACUATOR (MISCELLANEOUS) IMPLANT
PROTECTOR NERVE ULNAR (MISCELLANEOUS) ×4 IMPLANT
RELOAD STAPLE 45 3.5 BLU DVNC (STAPLE) IMPLANT
RELOAD STAPLE 45 4.3 GRN DVNC (STAPLE) IMPLANT
RELOAD STAPLE 60 3.5 BLU DVNC (STAPLE) IMPLANT
RELOAD STAPLE 60 4.3 GRN DVNC (STAPLE) IMPLANT
RELOAD STAPLER 3.5X45 BLU DVNC (STAPLE) IMPLANT
RELOAD STAPLER 3.5X60 BLU DVNC (STAPLE) IMPLANT
RELOAD STAPLER 4.3X45 GRN DVNC (STAPLE) IMPLANT
RELOAD STAPLER 4.3X60 GRN DVNC (STAPLE) ×2 IMPLANT
RETRACTOR WND ALEXIS 18 MED (MISCELLANEOUS) IMPLANT
RTRCTR WOUND ALEXIS 18CM MED (MISCELLANEOUS)
SCISSORS LAP 5X35 DISP (ENDOMECHANICALS) IMPLANT
SEAL CANN UNIV 5-8 DVNC XI (MISCELLANEOUS) ×4 IMPLANT
SEAL XI 5MM-8MM UNIVERSAL (MISCELLANEOUS) ×8
SEALER VESSEL DA VINCI XI (MISCELLANEOUS) ×2
SEALER VESSEL EXT DVNC XI (MISCELLANEOUS) ×1 IMPLANT
SLEEVE ADV FIXATION 5X100MM (TROCAR) IMPLANT
SOLUTION ELECTROLUBE (MISCELLANEOUS) ×2 IMPLANT
SPIKE FLUID TRANSFER (MISCELLANEOUS) ×2 IMPLANT
SPONGE GAUZE 2X2 STER 10/PKG (GAUZE/BANDAGES/DRESSINGS) ×1
STAPLER 60 DA VINCI SURE FORM (STAPLE) ×2
STAPLER 60 SUREFORM DVNC (STAPLE) IMPLANT
STAPLER CANNULA SEAL DVNC XI (STAPLE) ×1 IMPLANT
STAPLER CANNULA SEAL XI (STAPLE) ×2
STAPLER ECHELON POWER CIR 29 (STAPLE) ×1 IMPLANT
STAPLER ECHELON POWER CIR 31 (STAPLE) IMPLANT
STAPLER RELOAD 3.5X45 BLU DVNC (STAPLE)
STAPLER RELOAD 3.5X45 BLUE (STAPLE)
STAPLER RELOAD 3.5X60 BLU DVNC (STAPLE)
STAPLER RELOAD 3.5X60 BLUE (STAPLE)
STAPLER RELOAD 4.3X45 GREEN (STAPLE)
STAPLER RELOAD 4.3X45 GRN DVNC (STAPLE)
STAPLER RELOAD 4.3X60 GREEN (STAPLE) ×4
STAPLER RELOAD 4.3X60 GRN DVNC (STAPLE) ×2
STOPCOCK 4 WAY LG BORE MALE ST (IV SETS) ×4 IMPLANT
SURGILUBE 2OZ TUBE FLIPTOP (MISCELLANEOUS) ×2 IMPLANT
SUT MNCRL AB 4-0 PS2 18 (SUTURE) ×2 IMPLANT
SUT PDS AB 1 CT1 27 (SUTURE) IMPLANT
SUT PDS AB 1 TP1 96 (SUTURE) IMPLANT
SUT PROLENE 0 CT 2 (SUTURE) IMPLANT
SUT PROLENE 2 0 KS (SUTURE) ×2 IMPLANT
SUT PROLENE 2 0 SH DA (SUTURE) IMPLANT
SUT SILK 2 0 (SUTURE)
SUT SILK 2 0 SH CR/8 (SUTURE) IMPLANT
SUT SILK 2-0 18XBRD TIE 12 (SUTURE) IMPLANT
SUT SILK 3 0 (SUTURE) ×2
SUT SILK 3 0 SH CR/8 (SUTURE) ×2 IMPLANT
SUT SILK 3-0 18XBRD TIE 12 (SUTURE) ×1 IMPLANT
SUT V-LOC BARB 180 2/0GR6 GS22 (SUTURE)
SUT VIC AB 3-0 SH 18 (SUTURE) IMPLANT
SUT VIC AB 3-0 SH 27 (SUTURE)
SUT VIC AB 3-0 SH 27XBRD (SUTURE) IMPLANT
SUT VICRYL 0 UR6 27IN ABS (SUTURE) ×2 IMPLANT
SUTURE V-LC BRB 180 2/0GR6GS22 (SUTURE) IMPLANT
SYR 10ML LL (SYRINGE) ×2 IMPLANT
SYS LAPSCP GELPORT 120MM (MISCELLANEOUS)
SYS WOUND ALEXIS 18CM MED (MISCELLANEOUS) ×2
SYSTEM LAPSCP GELPORT 120MM (MISCELLANEOUS) IMPLANT
SYSTEM WOUND ALEXIS 18CM MED (MISCELLANEOUS) ×1 IMPLANT
TAPE UMBILICAL 1/8 X36 TWILL (MISCELLANEOUS) ×2 IMPLANT
TOWEL OR NON WOVEN STRL DISP B (DISPOSABLE) ×2 IMPLANT
TRAY FOLEY MTR SLVR 16FR STAT (SET/KITS/TRAYS/PACK) ×2 IMPLANT
TROCAR ADV FIXATION 5X100MM (TROCAR) ×2 IMPLANT
TUBING CONNECTING 10 (TUBING) ×4 IMPLANT
TUBING INSUFFLATION 10FT LAP (TUBING) ×2 IMPLANT

## 2021-12-23 NOTE — Anesthesia Preprocedure Evaluation (Addendum)
Anesthesia Evaluation  ?Patient identified by MRN, date of birth, ID band ?Patient awake ? ? ? ?Reviewed: ?Allergy & Precautions, NPO status , Patient's Chart, lab work & pertinent test results ? ?History of Anesthesia Complications ?Negative for: history of anesthetic complications ? ?Airway ?Mallampati: III ? ?TM Distance: >3 FB ?Neck ROM: Full ? ? ? Dental ? ?(+) Teeth Intact, Dental Advisory Given ?  ?Pulmonary ?sleep apnea ,  ?  ?Pulmonary exam normal ? ? ? ? ? ? ? Cardiovascular ?negative cardio ROS ?Normal cardiovascular exam ? ? ?  ?Neuro/Psych ?PSYCHIATRIC DISORDERS Anxiety negative neurological ROS ?   ? GI/Hepatic ?Neg liver ROS, GERD  ,  ?Endo/Other  ?diabetes ? Renal/GU ?negative Renal ROS  ? ?  ?Musculoskeletal ?negative musculoskeletal ROS ?(+)  ? Abdominal ?  ?Peds ? Hematology ?negative hematology ROS ?(+)   ?Anesthesia Other Findings ? ? Reproductive/Obstetrics ? ?  ? ? ? ? ? ? ? ? ? ? ? ? ? ?  ?  ? ? ? ? ? ? ? ?Anesthesia Physical ?Anesthesia Plan ? ?ASA: 3 ? ?Anesthesia Plan: General  ? ?Post-op Pain Management: Celebrex PO (pre-op)* and Tylenol PO (pre-op)*  ? ?Induction: Intravenous ? ?PONV Risk Score and Plan: 4 or greater and Ondansetron, Dexamethasone, Midazolam and Scopolamine patch - Pre-op ? ?Airway Management Planned: Oral ETT ? ?Additional Equipment:  ? ?Intra-op Plan:  ? ?Post-operative Plan: Extubation in OR ? ?Informed Consent: I have reviewed the patients History and Physical, chart, labs and discussed the procedure including the risks, benefits and alternatives for the proposed anesthesia with the patient or authorized representative who has indicated his/her understanding and acceptance.  ? ? ? ?Dental advisory given ? ?Plan Discussed with: Anesthesiologist and CRNA ? ?Anesthesia Plan Comments:   ? ? ? ? ? ?Anesthesia Quick Evaluation ? ?

## 2021-12-23 NOTE — Progress Notes (Signed)
Patient set up for his home bipap. Sterile water added and machine is ready to be used. Pt prefer self placement.  ?

## 2021-12-23 NOTE — Transfer of Care (Signed)
Immediate Anesthesia Transfer of Care Note ? ?Patient: Roberto Charles. ? ?Procedure(s) Performed: XI ROBOTIC ASSISTED LOWER ANTERIOR RESECTION, BILATERAL TAP BLOCK ?FLEXIBLE SIGMOIDOSCOPY ? ?Patient Location: PACU ? ?Anesthesia Type:General ? ?Level of Consciousness: awake, alert  and oriented ? ?Airway & Oxygen Therapy: Patient Spontanous Breathing and Patient connected to face mask oxygen ? ?Post-op Assessment: Report given to RN and Post -op Vital signs reviewed and stable ? ?Post vital signs: Reviewed and stable ? ?Last Vitals:  ?Vitals Value Taken Time  ?BP 112/60 12/23/21 1426  ?Temp    ?Pulse 87 12/23/21 1429  ?Resp 19 12/23/21 1429  ?SpO2 100 % 12/23/21 1429  ?Vitals shown include unvalidated device data. ? ?Last Pain:  ?Vitals:  ? 12/23/21 1044  ?TempSrc:   ?PainSc: 0-No pain  ?   ? ?  ? ?Complications: No notable events documented. ?

## 2021-12-23 NOTE — Anesthesia Procedure Notes (Signed)
Procedure Name: Intubation ?Date/Time: 12/23/2021 11:28 AM ?Performed by: Sharlette Dense, CRNA ?Pre-anesthesia Checklist: Patient identified, Emergency Drugs available, Suction available and Patient being monitored ?Patient Re-evaluated:Patient Re-evaluated prior to induction ?Oxygen Delivery Method: Circle system utilized ?Preoxygenation: Pre-oxygenation with 100% oxygen ?Induction Type: IV induction ?Ventilation: Mask ventilation without difficulty ?Laryngoscope Size: Sabra Heck and 3 ?Grade View: Grade I ?Tube type: Oral ?Tube size: 8.0 mm ?Number of attempts: 1 ?Airway Equipment and Method: Stylet ?Placement Confirmation: ETT inserted through vocal cords under direct vision, positive ETCO2 and breath sounds checked- equal and bilateral ?Secured at: 22 cm ?Tube secured with: Tape ?Dental Injury: Teeth and Oropharynx as per pre-operative assessment  ? ? ? ? ?

## 2021-12-23 NOTE — Op Note (Signed)
PATIENT: Roberto Charles.  51 y.o. male ? ?Patient Care Team: ?Luetta Nutting, DO as PCP - General (Family Medicine) ? ?PREOP DIAGNOSIS: diverticulitis ? ?POSTOP DIAGNOSIS: diverticulitis ? ?PROCEDURE:  ?Robotic assisted low anterior resection with double stapled colorectal anastomosis ?Intraoperative assessment of perfusion using ICG fluorescence imaging ?Flexible sigmoidoscopy ?Bilateral transversus abdominus plane (TAP) blocks ? ?SURGEON: Sharon Mt. Antanasia Kaczynski, MD ? ?ASSISTANT: Leighton Ruff, MD ? ?ANESTHESIA: General endotracheal ? ?EBL: 75 mL ?Total I/O ?In: 1500 [I.V.:1000; IV Piggyback:500] ?Out: 175 [Urine:100; Blood:75] ? ?DRAINS: None ? ?SPECIMEN: Rectosigmoid colon - open end proximal ? ?COUNTS: Sponge, needle and instrument counts were reported correct x2 ? ?FINDINGS: Mid and distal sigmoid with significant wall thickening and diseased appearance consistent with history of diverticulitis.  There is a hairpin turn at the location of the disease as well.  We remove the entire diseased portion of colon which extended down to the rectosigmoid junction.  Therefore, an LAR with colorectal anastomosis was necessary.  A well perfused, tension free, hemostatic, air tight 29 mm EEA colorectal anastomosis fashioned 15 cm from the anal verge by flexible sigmoidoscopy. ? ? ?NARRATIVE: ?Informed consent was verified. The patient was taken to the operating room, placed supine on the operating table and SCD's were applied. General endotracheal anesthesia was induced without difficulty. He was then positioned in the lithotomy position with Allen stirrups.  Pressure points were evaluated and padded.  A foley catheter was then placed by nursing under sterile conditions. Hair on the abdomen was clipped.  He was secured to the operating table. The abdomen was then prepped and draped in the standard sterile fashion. Surgical timeout was called indicating the correct patient, procedure, positioning and need for preoperative  antibiotics. ?  ?An OG tube was placed by anesthesia and confirmed to be to suction.  At Palmer's point, a stab incision was created and the Veress needle was introduced into the peritoneal cavity on the first attempt.  Intraperitoneal location was confirmed by the aspiration and saline drop test.  Pneumoperitoneum was established to a maximum pressure of 15 mmHg using CO2.  Following this, the abdomen was marked for planned trocar sites.  Just to the right and cephalad to the umbilicus, an 8 mm incision was created and an 8 mm blunt tipped robotic trocar was cautiously placed into the peritoneal cavity.  The laparoscope was inserted and demonstrated no evidence of trocar site nor Veress needle site complications.  The Veress needle was removed.  Bilateral transversus abdominis plane blocks were then created using a dilute mixture of Exparel with Marcaine.  3 additional 8 mm robotic trochars were placed under direct visualization roughly in a line extending from the right ASIS towards the left upper quadrant. The bladder was inspected and noted to be at/below the pubic symphysis.  Staying 3 fingerbreadths above the pubic symphysis, an incision was created and the 12 mm robotic trocar inserted directed cephalad into the peritoneal cavity under direct visualization.  An additional 5 mm assist port was placed in the right lateral abdomen under direct visualization.  The abdomen was surveyed and there was diseased mid/distal sigmoid colon with wall thickening and adhesions between itself.  This is all consistent in appearance with history of complicated diverticulitis. He was positioned in Trendelenburg with the left side tilted slightly up.  Small bowel was carefully retracted out of the pelvis.  The robot was then docked and I went to the console. ? ?The sigmoid colon was readily identified.  Attachments of the  sigmoid colon were taken down from the intersigmoid fossa.  We were able to visualize his left and right  ureter which both reside fairly laterally in his pelvis.  ? ?The rectosigmoid colon was grasped and elevated anteriorly. Beginning with a medial to lateral approach, the peritoneum overlying the presacral space was carefully incised.  The TME plane was readily gained working in a plane between the fascia propria of the rectum and the presacral fascia.  Hypogastric nerves were seen going along the the presacral fascia and were protected free of injury.  Working more proximally, the mesorectum and sigmoid mesentery were carefully mobilized off of the peritoneum.  The left ureter was identified and protected free of injury.  The left gonadal vessels were identified and protected.  These were both swept "down."  The superior hemorrhoidal and IMA pedicles were identified. Further mesocolon was mobilized proximally staying in this plane between the retroperitoneum proper and the mesocolon. Attention was then turned to the lateral portion of dissection.  The sigmoid colon was then retracted to the right.  The sigmoid colon was fully mobilized. The descending colon was mobilized by incising the Lindamarie Maclachlan line of [place].  This was done all the way up to the level of the splenic flexure. ? ?The associated mesocolon was also mobilized medially.  The left ureter again was confirmed to be well away from the vasculature which had been dissected medially.  The rectosigmoid colon was elevated anteriorly. The left ureter was re-identified. The IMA was clear of this. The IMA was then divided with the vessel sealer. The stump was inspected and noted to be completely hemostatic with a good seal.  The mesentery was divided out to the point of planned proximal division. ? ?Working more distally, the rectum was identified where the tinea had splayed and there were loss of appendices epiploica.  This also corresponded to a location overlying the sacral promontory.  Anatomically, this clearly represents the proximal rectum.  The mesentery out to  this level was then cleared using the vessel sealer. The distal point of transection on the proximal rectum was identified.   A 60 mm green load robotic stapler was then placed through the 12 mm port and introduced into the peritoneal cavity.  The rectum was divided with 2 firings of the stapler.  The stump was intact and healthy in appearance. ?  ?Attention was turned to performing a perfusion test. ICG was administered by anesthesia and at the level of the cleared mesentery proximally, there was excellent uptake of the tracer.  The rectum was also well perfused in appearance.  There was a visible pulse in the mesentery out to the level of the cleared colon at the level of the proximal sigmoid/descending colon junction.  This colon is also supple and healthy in appearance without any thickening.  This reached into the pelvis without any difficulty and remained in that location without any tension. A locking grasper was then placed on the sigmoid staple line. ?  ?Attention was turned to the extracorporeal portion of the procedure.  The robot was undocked.  I scrubbed back in.  Using the 12 mm trocar site, a Pfannenstiel incision was created and incorporated the fascial opening through the 12 mm port site.  The rectus fascia was incised and then elevated.  The rectus muscle was mobilized free of the overlying fascia.  The peritoneum was incised in the midline well above the location of the bladder.  An [place] wound protector was placed.  Towels  were placed around the field.  The divided colon was passed through the wound protector.  The point of proximal division was identified and was again on a healthy segment of supple colon with a palpable pulse in the mesentery. This was pink in color.  A pursestring device was applied.  A 2-0 Prolene on a Keith needle was passed.  The colon was divided and passed off with the open end being proximal.  EEA sizers were then introduced and a 29 mm EEA selected.  "Belt loops"  consisting of 3-0 silk were placed around the pursestring suture line.  The anvil was placed and the pursestring tied.  A small amount of fat was cleared from the planned anastomosis and no diverticula were

## 2021-12-23 NOTE — H&P (Signed)
? ?CC: Here today for surgery ? ?HPI: ?Roberto Charles. is an 51 y.o. male with history of pre-DM (since resolved), obesity, whom is seen in the office today as a referral by Dr. Bryan Lemma for evaluation of history of diverticulitis. ? ?He reports 2-3 attacks in the last 9 months of left lower quadrant and hypogastric pains. 2 of these brought him into the emergency room. He had CT scans completed 03/14/21 showing sigmoid diverticulitis without perforation or abscess. Bilateral kidney stones. ? ?CT 07/25/2021 showed sigmoid diverticulitis without extraluminal gas or fluid. Approximately 7 cm of sigmoid colon appears to be involved. Nonobstructing kidney stones. ? ?Colonoscopy 05/21/21 Dr. Bryan Lemma -5 mm polyp in sigmoid. Diverticulosis in sigmoid. Tortuous sigmoid. Some difficulty at 40 to 50 cm with advancing the scope. Ultimately successful. ? ?Since then, he has reduced the amount of red meat he has taken. He has not had a recurrent attack of diverticulitis since that time. He currently feels well and denies any complaints today. ? ?He denies any changes in his health or health history since we met in the office. He tolerated his prep with satisfactory result. ? ? ?PMH: pre-DM (since resolved), obesity, diverticulitis ? ?PSH: Lap chole 2017, [place] ? ?FHx: Maternal aunt and grandmother had uterine cancer in [place]. Denies any other known family history of colorectal, breast, endometrial or ovarian cancer ? ?Social Hx: Denies use of tobacco/illicit drug. Works in Insurance underwriter, [place] ? ?Review of Systems: ?A complete review of systems was obtained from the patient. I have reviewed this information and discussed as appropriate with the patient. See HPI as well for other ROS. ? ?Past Medical History:  ?Diagnosis Date  ? Allergy   ? Anxiety   ? GERD (gastroesophageal reflux disease)   ? Headache(784.0)   ? History of kidney stones   ? Obesity   ? Sleep apnea   ? on BPAP since June  ? ? ?Past Surgical  History:  ?Procedure Laterality Date  ? CHOLECYSTECTOMY    ? [place]  ? right hand surgery     ? VASECTOMY  2010  ? ? ?Family History  ?Problem Relation Age of Onset  ? Hypertension Father   ? Colon polyps Father   ? Uterine cancer Maternal Aunt   ? Testicular cancer Paternal Uncle   ? Uterine cancer Maternal Grandmother   ? Colon cancer Neg Hx   ? Pancreatic cancer Neg Hx   ? Stomach cancer Neg Hx   ? Liver cancer Neg Hx   ? Esophageal cancer Neg Hx   ? Rectal cancer Neg Hx   ? ? ?Social:  reports that he has never smoked. He has never used smokeless tobacco. He reports current alcohol use of about 2.0 standard drinks per week. He reports that he does not use drugs. ? ?Allergies:  ?Allergies  ?Allergen Reactions  ? Amoxicillin Other (See Comments)  ?  Causes blood in intestines- Had once incidence of bloody diarrhea but thinks he has tolerated since this incidence   ? ? ?Medications: I have reviewed the patient's current medications. ? ?No results found for this or any previous visit (from the past 48 hour(s)). ? ?No results found. ? ?ROS - all of the below systems have been reviewed with the patient and positives are indicated with bold text ?General: chills, fever or night sweats ?Eyes: blurry vision or double vision ?ENT: epistaxis or sore throat ?Allergy/Immunology: itchy/watery eyes or nasal congestion ?Hematologic/Lymphatic: bleeding problems, blood clots or  swollen lymph nodes ?Endocrine: temperature intolerance or unexpected weight changes ?Breast: new or changing breast lumps or nipple discharge ?Resp: cough, shortness of breath, or wheezing ?CV: chest pain or dyspnea on exertion ?GI: as per HPI ?GU: dysuria, trouble voiding, or hematuria ?MSK: joint pain or joint stiffness ?Neuro: TIA or stroke symptoms ?Derm: pruritus and skin lesion changes ?Psych: anxiety and depression ? ?PE ?There were no vitals taken for this visit. ?Constitutional: NAD; conversant ?Eyes: Moist conjunctiva; no lid  lag ?Lungs: Normal respiratory effort ?CV: RRR ?GI: Abd soft, NT/ND ?MSK: Normal range of motion of extremities ?Psychiatric: Appropriate affect; alert and oriented x3 ? ?No results found for this or any previous visit (from the past 48 hour(s)). ? ?No results found. ? ?A/P: ?Roberto Charles. is an 51 y.o. male with hx of obesity, pre-DM here for evaluation of recurrent diverticulitis ? ?-The anatomy and physiology of the GI tract was reviewed with the patient. The pathophysiology of diverticulitis was discussed as well with associated pictures. ?-We have discussed various different treatment options going forward covering further observation and dietary/lifestyle modifications as well as surgery. We discussed that in his case with uncomplicated diverticulitis, surgery being primarily reserved for quality of life indications. We discussed that if the frequency of attack or severity of attacks was such that it interfered with his quality of life and he was interested in a risk reducing surgery, this would be a viable option. ?-We discussed robotic assisted sigmoidectomy/low anterior resection. Flexible sigmoidoscopy. ?-The procedures, material risks (including, but not limited to, pain, bleeding, infection, scarring, need for blood transfusion, damage to surrounding structures- blood vessels/nerves/viscus/organs, damage to ureter, urine leak, leak from anastomosis, need for additional procedures, scenarios where a stoma may be necessary and where it may be permanent, worsening of pre-existing medical conditions, hernia, recurrence, pneumonia, heart attack, stroke, death) benefits and alternatives to surgery were discussed at length. The patient's questions were answered to his satisfaction, he voiced understanding and elected to continue to monitor for time being. Additionally, we discussed typical postoperative expectations and the recovery process. ?-Given his goals, we therefore discussed dietary modifications  including fiber supplementation-Metamucil/Benefiber daily. Working to maintain some degree of physical activity such as light aerobics/walking for 20 to 30 minutes a day 5 days/week. We discussed sensible dietary modifications including healthy diets higher in fruits and vegetables. ?-Should he have other questions, concerns or changes mind we will remain available to assist in his care as necessary moving forward ? ?Nadeen Landau, MD FACS ?[facility] ? ?

## 2021-12-24 ENCOUNTER — Encounter (HOSPITAL_COMMUNITY): Payer: Self-pay | Admitting: Surgery

## 2021-12-24 ENCOUNTER — Other Ambulatory Visit (HOSPITAL_COMMUNITY): Payer: Self-pay

## 2021-12-24 LAB — CBC
HCT: 39.9 % (ref 39.0–52.0)
Hemoglobin: 13.1 g/dL (ref 13.0–17.0)
MCH: 26.9 pg (ref 26.0–34.0)
MCHC: 32.8 g/dL (ref 30.0–36.0)
MCV: 81.9 fL (ref 80.0–100.0)
Platelets: 187 10*3/uL (ref 150–400)
RBC: 4.87 MIL/uL (ref 4.22–5.81)
RDW: 14.1 % (ref 11.5–15.5)
WBC: 13 10*3/uL — ABNORMAL HIGH (ref 4.0–10.5)
nRBC: 0 % (ref 0.0–0.2)

## 2021-12-24 LAB — BASIC METABOLIC PANEL
Anion gap: 5 (ref 5–15)
BUN: 8 mg/dL (ref 6–20)
CO2: 25 mmol/L (ref 22–32)
Calcium: 8.3 mg/dL — ABNORMAL LOW (ref 8.9–10.3)
Chloride: 108 mmol/L (ref 98–111)
Creatinine, Ser: 0.57 mg/dL — ABNORMAL LOW (ref 0.61–1.24)
GFR, Estimated: >60 mL/min (ref >60)
Glucose, Bld: 129 mg/dL — ABNORMAL HIGH (ref 70–99)
Potassium: 4.2 mmol/L (ref 3.5–5.1)
Sodium: 138 mmol/L (ref 135–145)

## 2021-12-24 MED ORDER — TRAMADOL HCL 50 MG PO TABS
50.0000 mg | ORAL_TABLET | Freq: Four times a day (QID) | ORAL | 0 refills | Status: AC | PRN
  Filled 2021-12-24: qty 15, 4d supply, fill #0

## 2021-12-24 NOTE — Progress Notes (Signed)
?  Subjective ?No acute events. Feeling well, no complaints at present. No n/v, tolerating liquids without difficulty. +Appetite. Unsure if flatus; no bm. ? ?Objective: ?Vital signs in last 24 hours: ?Temp:  [97.4 ?F (36.3 ?C)-98.6 ?F (37 ?C)] 98.1 ?F (36.7 ?C) (04/21 0545) ?Pulse Rate:  [58-89] 63 (04/21 0545) ?Resp:  [14-24] 18 (04/21 0545) ?BP: (97-143)/(49-96) 119/70 (04/21 0545) ?SpO2:  [96 %-100 %] 96 % (04/21 0545) ?Weight:  [129.3 kg-132.9 kg] 132.9 kg (04/21 0500) ?Last BM Date : 12/23/21 ? ?Intake/Output from previous day: ?04/20 0701 - 04/21 0700 ?In: 6004 [P.O.:480; I.V.:2650; IV HTXHFSFSE:395] ?Out: 3202 [Urine:3700; Blood:75] ?Intake/Output this shift: ?No intake/output data recorded. ? ?Gen: NAD, comfortable ?CV: RRR ?Pulm: Normal work of breathing ?Abd: Soft, not significantly tender nor distended ?Ext: SCDs in place ? ?Lab Results: ?CBC  ?Recent Labs  ?  12/23/21 ?1045 12/24/21 ?0420  ?WBC 6.0 13.0*  ?HGB 14.0 13.1  ?HCT 40.5 39.9  ?PLT 174 187  ? ?BMET ?Recent Labs  ?  12/24/21 ?0420  ?NA 138  ?K 4.2  ?CL 108  ?CO2 25  ?GLUCOSE 129*  ?BUN 8  ?CREATININE 0.57*  ?CALCIUM 8.3*  ? ?PT/INR ?No results for input(s): LABPROT, INR in the last 72 hours. ?ABG ?No results for input(s): PHART, HCO3 in the last 72 hours. ? ?Invalid input(s): PCO2, PO2 ? ?Studies/Results: ? ?Anti-infectives: ?Anti-infectives (From admission, onward)  ? ? Start     Dose/Rate Route Frequency Ordered Stop  ? 12/23/21 1015  cefoTEtan (CEFOTAN) 2 g in sodium chloride 0.9 % 100 mL IVPB       ? 2 g ?200 mL/hr over 30 Minutes Intravenous On call to O.R. 12/23/21 1007 12/23/21 1150  ? ?  ? ? ? ?Assessment/Plan: ?Patient Active Problem List  ? Diagnosis Date Noted  ? S/P laparoscopic-assisted sigmoidectomy 12/23/2021  ? Obstructive sleep apnea 03/30/2021  ? Diverticulitis 03/14/2021  ? Diabetes mellitus type 2 in obese ([place]) 11/30/2020  ? Hematuria 06/09/2020  ? Well adult exam 04/30/2020  ? Mixed hyperlipidemia 12/25/2016  ? Perennial  allergic rhinitis with seasonal variation 12/25/2016  ? Obesity due to excess calories without serious comorbidity 09/14/2016  ? Snores 09/14/2016  ? ?s/p Procedure(s): ?XI ROBOTIC ASSISTED LOWER ANTERIOR RESECTION, BILATERAL TAP BLOCK ?FLEXIBLE SIGMOIDOSCOPY 12/23/2021 ? ?-He is doing well - spent time reviewing his procedure findings and plans. ?-Advance to soft diet ?-Ambulate 5x/day ?-D/C IVF ?-Ppx: SQH, SCDs ? ? LOS: 1 day  ? ? ?Nadeen Landau, MD FACS ?[facility] ?

## 2021-12-24 NOTE — Anesthesia Postprocedure Evaluation (Signed)
Anesthesia Post Note ? ?Patient: Roberto Charles. ? ?Procedure(s) Performed: XI ROBOTIC ASSISTED LOWER ANTERIOR RESECTION, BILATERAL TAP BLOCK ?FLEXIBLE SIGMOIDOSCOPY ? ?  ? ?Patient location during evaluation: PACU ?Anesthesia Type: General ?Level of consciousness: awake and alert ?Pain management: pain level controlled ?Vital Signs Assessment: post-procedure vital signs reviewed and stable ?Respiratory status: spontaneous breathing, nonlabored ventilation and respiratory function stable ?Cardiovascular status: blood pressure returned to baseline ?Postop Assessment: no apparent nausea or vomiting ?Anesthetic complications: no ? ? ?No notable events documented. ?  ?  ?  ? ?Marthenia Rolling ? ? ? ? ?

## 2021-12-24 NOTE — Progress Notes (Signed)
?  Transition of Care (TOC) Screening Note ? ? ?Patient Details  ?Name: Roberto Charles. ?Date of Birth: 1971/06/30 ? ? ?Transition of Care (TOC) CM/SW Contact:    ?Desirey Keahey, Marjie Skiff, RN ?Phone Number: ?12/24/2021, 2:08 PM ? ? ? ?Transition of Care Department [facility]) has reviewed patient and no TOC needs have been identified at this time. We will continue to monitor patient advancement through interdisciplinary progression rounds. If new patient transition needs arise, please place a TOC consult. ?  ?

## 2021-12-24 NOTE — Discharge Instructions (Addendum)
POST OP INSTRUCTIONS AFTER COLON SURGERY ? ?DIET: Be sure to include lots of fluids daily to stay hydrated - 64oz of water per day (8, 8 oz glasses).  Avoid fast food or heavy meals for the first couple of weeks as your are more likely to get nauseated. Avoid raw/uncooked fruits or vegetables for the first 4 weeks (its ok to have these if they are blended into smoothie form). If you have fruits/vegetables, make sure they are cooked until soft enough to mash on the roof of your mouth and chew your food well. Otherwise, diet as tolerated. ? ?Take your usually prescribed home medications unless otherwise directed. ? ?PAIN CONTROL: ?Pain is best controlled by a usual combination of three different methods TOGETHER: ?Ice/Heat ?Over the counter pain medication ?Prescription pain medication ?Most patients will experience some swelling and bruising around the surgical site.  Ice packs or heating pads (30-60 minutes up to 6 times a day) will help. Some people prefer to use ice alone, heat alone, alternating between ice & heat.  Experiment to what works for you.  Swelling and bruising can take several weeks to resolve.   ?It is helpful to take an over-the-counter pain medication regularly for the first few weeks: ?Ibuprofen (Motrin/Advil) - '200mg'$  tabs - take 3 tabs ('600mg'$ ) every 6 hours as needed for pain (unless you have been directed previously to avoid NSAIDs/ibuprofen) ?Acetaminophen (Tylenol) - you may take '650mg'$  every 6 hours as needed. You can take this with motrin as they act differently on the body. If you are taking a narcotic pain medication that has acetaminophen in it, do not take over the counter tylenol at the same time. ?NOTE: You may take both of these medications together - most patients  find it most helpful when alternating between the two (i.e. Ibuprofen at 6am, tylenol at 9am, ibuprofen at 12pm ...) ?A  prescription for pain medication should be given to you upon discharge.  Take your pain medication as  prescribed if your pain is not adequatly controlled with the over-the-counter pain reliefs mentioned above. ? ?Avoid getting constipated.  Between the surgery and the pain medications, it is common to experience some constipation.  Increasing fluid intake and taking a fiber supplement (such as Metamucil, Citrucel, FiberCon, MiraLax, etc) 1-2 times a day regularly will usually help prevent this problem from occurring.  A mild laxative (prune juice, Milk of Magnesia, MiraLax, etc) should be taken according to package directions if there are no bowel movements after 48 hours.   ? ?Dressing: Your incisions are covered in Dermabond which is like sterile superglue for the skin. This will come off on it's own in a couple weeks. It is waterproof and you may bathe normally starting the day after your surgery in a shower. Avoid baths/pools/lakes/oceans until your wounds have fully healed. ? ?ACTIVITIES as tolerated:   ?Avoid heavy lifting (>10lbs or 1 gallon of milk) for the next 6 weeks. ?You may resume regular daily activities as tolerated--such as daily self-care, walking, climbing stairs--gradually increasing activities as tolerated.  If you can walk 30 minutes without difficulty, it is safe to try more intense activity such as jogging, treadmill, bicycling, low-impact aerobics.  ?DO NOT PUSH THROUGH PAIN.  Let pain be your guide: If it hurts to do something, don't do it. ?You may drive when you are no longer taking prescription pain medication, you can comfortably wear a seatbelt, and you can safely maneuver your car and apply brakes. ? ?FOLLOW UP in our  office ?Please call [facility] at [phone] to set up an appointment to see your surgeon in the office for a follow-up appointment approximately 2 weeks after your surgery. ?Make sure that you call for this appointment the day you arrive home to insure a convenient appointment time. ? ?9. If you have disability or family leave forms that need to be completed, you may have  them completed by your primary care physician's office; for return to work instructions, please ask our office staff and they will be happy to assist you in obtaining this documentation ?  ?When to call us [phone]: ?Poor pain control ?Reactions / problems with new medications (rash/itching, etc)  ?Fever over 101.5 F (38.5 C) ?Inability to urinate ?Nausea/vomiting ?Worsening swelling or bruising ?Continued bleeding from incision. ?Increased pain, redness, or drainage from the incision ? ?The clinic staff is available to answer your questions during regular business hours (8:30am-5pm).  Please don?t hesitate to call and ask to speak to one of our nurses for clinical concerns.   A surgeon from [facility] is always on call at the hospitals ?  ?If you have a medical emergency, go to the nearest emergency room or call 911. ? ?[facility], [place] ?[address], [place], [place], [place]  [address] ?MAIN: [phone] ?FAX: [phone] ?[email or web address] ? ?################################################### ? ? ?GETTING TO [facility]. ? ?###################################################################### ? ?EAT ?Gradually transition to a high fiber diet with a fiber supplement over the next few weeks after discharge.  Start with a pureed / full liquid diet (see below) ? ?WALK ?Walk an hour a day.  Control your pain to do that.   ? ?HAVE A BOWEL MOVEMENT DAILY ?Keep your bowels regular to avoid problems.  OK to try a laxative to override constipation.  OK to use an antidairrheal to slow down diarrhea.  Call if not better after 2 tries ? ?CALL IF YOU HAVE PROBLEMS/CONCERNS ?Call if you are still struggling despite following these instructions. ?Call if you have concerns not answered by these instructions ? ?###################################################################### ? ? ?Irregular bowel habits such as constipation and diarrhea can lead to many  problems over time.  Having one soft bowel movement a day is the most important way to prevent further problems.  The anorectal canal is designed to handle stretching and feces to safely manage our ability to get rid of solid waste (feces, poop, stool) out of our body.  BUT, hard constipated stools can act like ripping concrete bricks and diarrhea can be a burning fire to this very sensitive area of our body, causing inflamed hemorrhoids, anal fissures, increasing risk is perirectal abscesses, abdominal pain/bloating, an making irritable bowel worse.     ? ?The goal: ONE SOFT BOWEL MOVEMENT A DAY!  To have soft, regular bowel movements:  ?Drink plenty of fluids, consider 4-6 tall glasses of water a day.   ?Take plenty of fiber.  Fiber is the undigested part of plant food that passes into the colon, acting s ?natures broom? to encourage bowel motility and movement.  Fiber can absorb and hold large amounts of water. This results in a larger, bulkier stool, which is soft and easier to pass. Work gradually over several weeks up to 6 servings a day of fiber (25g a day even more if needed) in the form of: ?Vegetables -- Root (potatoes, carrots, turnips), leafy green (lettuce, salad greens, celery, spinach), or cooked high residue (cabbage, broccoli, etc) ?Fruit -- Fresh (unpeeled skin &  pulp), Dried (prunes, apricots, cherries, etc ),  or stewed ( applesauce)  ?Whole grain breads, pasta, etc (whole wheat)  ?Bran cereals  ?Bulking Agents -- This type of water-retaining fiber generally is easily obtained each day by one of the following:  ?Psyllium bran -- The psyllium plant is remarkable because its ground seeds can retain so much water. This product is available as Metamucil, Konsyl, Effersyllium, Per Diem Fiber, or the less expensive generic preparation in drug and health food stores. Although labeled a laxative, it really is not a laxative.  ?Methylcellulose -- This is another fiber derived from wood which also retains  water. It is available as Citrucel. ?Polyethylene Glycol - and ?artificial? fiber commonly called Miralax or Glycolax.  It is helpful for people with gassy or bloated feelings with regular fiber ?Flax Seed

## 2021-12-25 LAB — CBC
HCT: 39.1 % (ref 39.0–52.0)
Hemoglobin: 13.1 g/dL (ref 13.0–17.0)
MCH: 27.6 pg (ref 26.0–34.0)
MCHC: 33.5 g/dL (ref 30.0–36.0)
MCV: 82.5 fL (ref 80.0–100.0)
Platelets: 170 10*3/uL (ref 150–400)
RBC: 4.74 MIL/uL (ref 4.22–5.81)
RDW: 14.5 % (ref 11.5–15.5)
WBC: 11.2 10*3/uL — ABNORMAL HIGH (ref 4.0–10.5)
nRBC: 0 % (ref 0.0–0.2)

## 2021-12-25 LAB — BASIC METABOLIC PANEL
Anion gap: 4 — ABNORMAL LOW (ref 5–15)
BUN: 15 mg/dL (ref 6–20)
CO2: 25 mmol/L (ref 22–32)
Calcium: 8.3 mg/dL — ABNORMAL LOW (ref 8.9–10.3)
Chloride: 108 mmol/L (ref 98–111)
Creatinine, Ser: 0.68 mg/dL (ref 0.61–1.24)
GFR, Estimated: >60 mL/min (ref >60)
Glucose, Bld: 103 mg/dL — ABNORMAL HIGH (ref 70–99)
Potassium: 3.7 mmol/L (ref 3.5–5.1)
Sodium: 137 mmol/L (ref 135–145)

## 2021-12-25 MED ORDER — PHENOL 1.4 % MT LIQD
1.0000 | OROMUCOSAL | Status: DC | PRN
  Administered 2021-12-25: 1 via OROMUCOSAL
  Filled 2021-12-25: qty 177

## 2021-12-25 NOTE — Progress Notes (Signed)
2 Days Post-Op  ? ?Subjective/Chief Complaint: ?Complains of pain on right side of abd. Having diarrhea and sore throat ? ? ?Objective: ?Vital signs in last 24 hours: ?Temp:  [97.8 ?F (36.6 ?C)-98.2 ?F (36.8 ?C)] 97.8 ?F (36.6 ?C) (04/22 0515) ?Pulse Rate:  [62-64] 64 (04/22 0515) ?Resp:  [14-16] 16 (04/22 0515) ?BP: (98-111)/(52-67) 111/67 (04/22 0515) ?SpO2:  [95 %-97 %] 95 % (04/22 0515) ?Last BM Date : 12/23/21 ? ?Intake/Output from previous day: ?04/21 0701 - 04/22 0700 ?In: 1080 [P.O.:1080] ?Out: 300 [Urine:300] ?Intake/Output this shift: ?No intake/output data recorded. ? ?General appearance: alert and cooperative ?Resp: clear to auscultation bilaterally ?Cardio: regular rate and rhythm ?GI: soft, mild tenderness on right. Incisions look good. Good bs ? ?Lab Results:  ?Recent Labs  ?  12/24/21 ?0420 12/25/21 ?0424  ?WBC 13.0* 11.2*  ?HGB 13.1 13.1  ?HCT 39.9 39.1  ?PLT 187 170  ? ?BMET ?Recent Labs  ?  12/24/21 ?0420 12/25/21 ?0424  ?NA 138 137  ?K 4.2 3.7  ?CL 108 108  ?CO2 25 25  ?GLUCOSE 129* 103*  ?BUN 8 15  ?CREATININE 0.57* 0.68  ?CALCIUM 8.3* 8.3*  ? ?PT/INR ?No results for input(s): LABPROT, INR in the last 72 hours. ?ABG ?No results for input(s): PHART, HCO3 in the last 72 hours. ? ?Invalid input(s): PCO2, PO2 ? ?Studies/Results: ?No results found. ? ?Anti-infectives: ?Anti-infectives (From admission, onward)  ? ? Start     Dose/Rate Route Frequency Ordered Stop  ? 12/23/21 1015  cefoTEtan (CEFOTAN) 2 g in sodium chloride 0.9 % 100 mL IVPB       ? 2 g ?200 mL/hr over 30 Minutes Intravenous On call to O.R. 12/23/21 1007 12/23/21 1150  ? ?  ? ? ?Assessment/Plan: ?s/p Procedure(s): ?XI ROBOTIC ASSISTED LOWER ANTERIOR RESECTION, BILATERAL TAP BLOCK (N/A) ?FLEXIBLE SIGMOIDOSCOPY (N/A) ?Advance diet ?Diarrhea will likely improve as diet advances ?Chloraseptic spray for throat. No evidence of thrush ?Probably needs another day ?Patient Active Problem List  ?  Diagnosis Date Noted  ? S/P  laparoscopic-assisted sigmoidectomy 12/23/2021  ? Obstructive sleep apnea 03/30/2021  ? Diverticulitis 03/14/2021  ? Diabetes mellitus type 2 in obese ([place]) 11/30/2020  ? Hematuria 06/09/2020  ? Well adult exam 04/30/2020  ? Mixed hyperlipidemia 12/25/2016  ? Perennial allergic rhinitis with seasonal variation 12/25/2016  ? Obesity due to excess calories without serious comorbidity 09/14/2016  ? Snores 09/14/2016  ?  ?s/p Procedure(s): ?XI ROBOTIC ASSISTED LOWER ANTERIOR RESECTION, BILATERAL TAP BLOCK ?FLEXIBLE SIGMOIDOSCOPY 12/23/2021 ?  ?-He is doing well - spent time reviewing his procedure findings and plans. ?-Advance to soft diet ?-Ambulate 5x/day ?-D/C IVF ?-Ppx: SQH, SCDs ? LOS: 2 days  ? ? ?Roberto Charles ?12/25/2021 ? ?

## 2021-12-26 LAB — CBC
HCT: 40.9 % (ref 39.0–52.0)
Hemoglobin: 13.6 g/dL (ref 13.0–17.0)
MCH: 27.4 pg (ref 26.0–34.0)
MCHC: 33.3 g/dL (ref 30.0–36.0)
MCV: 82.5 fL (ref 80.0–100.0)
Platelets: 180 10*3/uL (ref 150–400)
RBC: 4.96 MIL/uL (ref 4.22–5.81)
RDW: 14.5 % (ref 11.5–15.5)
WBC: 9.6 10*3/uL (ref 4.0–10.5)
nRBC: 0 % (ref 0.0–0.2)

## 2021-12-26 LAB — BASIC METABOLIC PANEL
Anion gap: 6 (ref 5–15)
BUN: 16 mg/dL (ref 6–20)
CO2: 25 mmol/L (ref 22–32)
Calcium: 8.6 mg/dL — ABNORMAL LOW (ref 8.9–10.3)
Chloride: 107 mmol/L (ref 98–111)
Creatinine, Ser: 0.67 mg/dL (ref 0.61–1.24)
GFR, Estimated: >60 mL/min (ref >60)
Glucose, Bld: 106 mg/dL — ABNORMAL HIGH (ref 70–99)
Potassium: 3.6 mmol/L (ref 3.5–5.1)
Sodium: 138 mmol/L (ref 135–145)

## 2021-12-26 MED ORDER — MAGIC MOUTHWASH
15.0000 mL | Freq: Four times a day (QID) | ORAL | Status: DC | PRN
Start: 2021-12-26 — End: 2021-12-26
  Filled 2021-12-26: qty 15

## 2021-12-26 MED ORDER — SODIUM CHLORIDE 0.9% FLUSH
3.0000 mL | Freq: Two times a day (BID) | INTRAVENOUS | Status: DC
Start: 2021-12-26 — End: 2021-12-26
  Administered 2021-12-26: 3 mL via INTRAVENOUS

## 2021-12-26 MED ORDER — CALCIUM POLYCARBOPHIL 625 MG PO TABS
625.0000 mg | ORAL_TABLET | Freq: Two times a day (BID) | ORAL | Status: DC
  Administered 2021-12-26: 625 mg via ORAL
  Filled 2021-12-26: qty 1

## 2021-12-26 MED ORDER — LIP MEDEX EX OINT
1.0000 "application " | TOPICAL_OINTMENT | Freq: Two times a day (BID) | CUTANEOUS | Status: DC
  Administered 2021-12-26: 1 via TOPICAL
  Filled 2021-12-26: qty 7

## 2021-12-26 MED ORDER — CYCLOBENZAPRINE HCL 10 MG PO TABS
10.0000 mg | ORAL_TABLET | Freq: Three times a day (TID) | ORAL | 2 refills | Status: DC | PRN
  Filled 2021-12-26: qty 30, 10d supply, fill #0

## 2021-12-26 MED ORDER — SODIUM CHLORIDE 0.9% FLUSH
3.0000 mL | INTRAVENOUS | Status: DC | PRN
Start: 2021-12-26 — End: 2021-12-26

## 2021-12-26 MED ORDER — TRIAMCINOLONE ACETONIDE 55 MCG/ACT NA AERO
2.0000 | INHALATION_SPRAY | Freq: Every day | NASAL | Status: DC
  Administered 2021-12-26: 2 via NASAL
  Filled 2021-12-26: qty 21.6

## 2021-12-26 MED ORDER — LACTATED RINGERS IV BOLUS: 1000.0000 mL | Freq: Three times a day (TID) | INTRAVENOUS | Status: DC | PRN

## 2021-12-26 MED ORDER — SODIUM CHLORIDE 0.9 % IV SOLN: 250.0000 mL | INTRAVENOUS | Status: DC | PRN

## 2021-12-26 MED ORDER — PROCHLORPERAZINE EDISYLATE 10 MG/2ML IJ SOLN: 5.0000 mg | INTRAMUSCULAR | Status: DC | PRN

## 2021-12-26 NOTE — Discharge Summary (Signed)
Physician Discharge Summary  ? ? ?Patient ID: ?Roberto Charles. ?MRN: [id number] ?DOB/AGE: 51/04/1971  ?51 y.o. ? ?Patient Care Team: ?Luetta Nutting, DO as PCP - General (Family Medicine) ? ?Admit date: 12/23/2021 ? ?Discharge date: 12/26/2021 ? ?Hospital Stay = 3 days ? ? ? ?Discharge Diagnoses:  ?Principal Problem: ?  Diverticulitis s/p robotic rectosigmoid resection 12/23/2021 ?Active Problems: ?  Mixed hyperlipidemia ?  Morbid obesity with BMI of 45.0-49.9, adult ([place]) ?  Perennial allergic rhinitis with seasonal variation ?  Diabetes mellitus type 2 in obese [facility]) ?  Obstructive sleep apnea ?  S/P laparoscopic-assisted sigmoidectomy ? ? ?3 Days Post-Op  12/23/2021 ? ?POSTOP DIAGNOSIS: diverticulitis ?  ?PROCEDURE:  ?Robotic assisted low anterior resection with double stapled colorectal anastomosis ?Intraoperative assessment of perfusion using ICG fluorescence imaging ?Flexible sigmoidoscopy ?Bilateral transversus abdominus plane (TAP) blocks ?  ?SURGEON: Sharon Mt. White, MD ? ?Consults: Pharmacy and Nutrition ? ?Hospital Course:  ? ?Pleasant morbidly obese male with multiple medical problems with episodes of diverticulitis.  Felt to benefit from resection.  The patient underwent the surgery above.  Postoperatively, the patient gradually mobilized and advanced to a solid diet.  Pain and other symptoms were treated aggressively.   ? ?By the time of discharge, the patient was walking well the hallways, eating food, having flatus.  Pain was well-controlled on an oral medications.  Based on meeting discharge criteria and continuing to recover, I felt it was safe for the patient to be discharged from the hospital to further recover with close followup. Postoperative recommendations were discussed in detail.  They are written as well. ? ?Discharged Condition: good ? ?Discharge Exam: ?Blood pressure 120/78, pulse 77, temperature 97.9 ?F (36.6 ?C), temperature source Oral, resp. rate 16, height 5' 6.5" (1.689 m),  weight 128.7 kg, SpO2 97 %. ? ?General: Pt awake/alert/oriented x4 in No acute distress ?Eyes: PERRL, normal EOM.  Sclera clear.  No icterus ?Neuro: CN II-XII intact w/o focal sensory/motor deficits. ?Lymph: No head/neck/groin lymphadenopathy ?Psych:  No delerium/psychosis/paranoia ?HENT: Normocephalic, Mucus membranes moist.  No thrush ?Neck: Supple, No tracheal deviation ?Chest:  No chest wall pain w good excursion ?CV:  Pulses intact.  Regular rhythm ?MS: Normal AROM mjr joints.  No obvious deformity ?Abdomen: Soft.  Nondistended.  Tenderness at right lower quadrant and suprapubic Pfannenstiel incision. Incisions clean dry and intact.  No evidence of peritonitis.  No incarcerated hernias. ?Ext:  SCDs BLE.  No mjr edema.  No cyanosis ?Skin: No petechiae / purpura ? ? ?Disposition:  ? ? Follow-up Information   ? ? Ileana Roup, MD Follow up in 2 week(s).   ?Specialties: General Surgery, Colon and Rectal Surgery ?Why: ~2-3 weeks ?Contact information: ?[facility] ?[address] ?[place] [address] ?[phone] ? ? ?  ?  ? ?  ?  ? ?  ? ? ?Discharge disposition: 01-Home or Self Care ? ? ? ? ? ? ?Discharge Instructions   ? ? Call MD for:   Complete by: As directed ?  ? FEVER > 101.5 F  ?(temperatures < 101.5 F are not significant)  ? Call MD for:  extreme fatigue   Complete by: As directed ?  ? Call MD for:  persistant dizziness or light-headedness   Complete by: As directed ?  ? Call MD for:  persistant nausea and vomiting   Complete by: As directed ?  ? Call MD for:  redness, tenderness, or signs of infection (pain, swelling, redness, odor or green/yellow discharge  around incision site)   Complete by: As directed ?  ? Call MD for:  severe uncontrolled pain   Complete by: As directed ?  ? Diet - low sodium heart healthy   Complete by: As directed ?  ? Start with a bland diet such as soups, liquids, starchy foods, low fat foods, etc. the first few days at home. ?Gradually advance to a solid,  low-fat, high fiber diet by the end of the first week at home.   ?Add a fiber supplement to your diet (Metamucil, etc) ?If you feel full, bloated, or constipated, stay on a full liquid or pureed/blenderized diet for a few days until you feel better and are no longer constipated.  ? Discharge instructions   Complete by: As directed ?  ? See Discharge Instructions ?If you are not getting better after two weeks or are noticing you are getting worse, contact our office [phone] for further advice.  We may need to adjust your medications, re-evaluate you in the office, send you to the emergency room, or see what other things we can do to help. ?The clinic staff is available to answer your questions during regular business hours (8:30am-5pm).  Please don't hesitate to call and ask to speak to one of our nurses for clinical concerns.    ?A surgeon from [facility] is always on call at the hospitals 24 hours/day ?If you have a medical emergency, go to the nearest emergency room or call 911.  ? Discharge wound care:   Complete by: As directed ?  ? It is good for closed incisions and even open wounds to be washed every day.  Shower every day.  Short baths are fine.  Wash the incisions and wounds clean with soap & water.    ?You may leave closed incisions open to air if it is dry.   You may cover the incision with clean gauze & replace it after your daily shower for comfort. ? ?DERMABOND:  You have purple skin glue (Dermabond) on your incision(s).  Leave them in place, and they will fall off on their own like a scab in 2-3 weeks.  You may trim any edges that curl up with clean scissors.  ? Driving Restrictions   Complete by: As directed ?  ? You may drive when: ?- you are no longer taking narcotic prescription pain medication ?- you can comfortably wear a seatbelt ?- you can safely make sudden turns/stops without pain.  ? Increase activity slowly   Complete by: As directed ?  ? Start light daily activities  --- self-care, walking, climbing stairs- beginning the day after surgery.  Gradually increase activities as tolerated.  Control your pain to be active.  Stop when you are tired.  Ideally, walk several times a day, eventually an hour a day.   ?Most people are back to most day-to-day activities in a few weeks.  It takes 4-6 weeks to get back to unrestricted, intense activity. ?If you can walk 30 minutes without difficulty, it is safe to try more intense activity such as jogging, treadmill, bicycling, low-impact aerobics, swimming, etc. ?Save the most intensive and strenuous activity for last (Usually 4-8 weeks after surgery) such as sit-ups, heavy lifting, contact sports, etc.  Refrain from any intense heavy lifting or straining until you are off narcotics for pain control.  You will have off days, but things should improve week-by-week. ?DO NOT PUSH THROUGH PAIN.  Let pain be your guide: If it hurts  to do something, don't do it.  ? Lifting restrictions   Complete by: As directed ?  ? If you can walk 30 minutes without difficulty, it is safe to try more intense activity such as jogging, treadmill, bicycling, low-impact aerobics, swimming, etc. ?Save the most intensive and strenuous activity for last (Usually 4-8 weeks after surgery) such as sit-ups, heavy lifting, contact sports, etc.   ?Refrain from any intense heavy lifting or straining until you are off narcotics for pain control.  You will have off days, but things should improve week-by-week. ?DO NOT PUSH THROUGH PAIN.  Let pain be your guide: If it hurts to do something, don't do it.  Pain is your body warning you to avoid that activity for another week until the pain goes down.  ? May shower / Bathe   Complete by: As directed ?  ? May walk up steps   Complete by: As directed ?  ? Remove dressing in 72 hours   Complete by: As directed ?  ? Make sure all dressings are removed by the third day after surgery.  Leave incisions open to air.  OK to cover incisions  with gauze or bandages as desired  ? Sexual Activity Restrictions   Complete by: As directed ?  ? You may have sexual intercourse when it is comfortable. If it hurts to do something, stop.  ? ?  ? ? ?Allergies as

## 2021-12-27 ENCOUNTER — Telehealth: Payer: Self-pay | Admitting: General Practice

## 2021-12-27 ENCOUNTER — Other Ambulatory Visit (HOSPITAL_COMMUNITY): Payer: Self-pay

## 2021-12-27 LAB — SURGICAL PATHOLOGY

## 2021-12-27 NOTE — Telephone Encounter (Signed)
Transition Care Management Follow-up Telephone Call ?Date of discharge and from where: 12/26/21 from [facility] ?How have you been since you were released from the hospital? States that he had his surgery and is now home recuperating. States he is doing. He has a follow up with the surgeon on 01/24/22. ?Any questions or concerns? No ? ?Items Reviewed: ?Did the pt receive and understand the discharge instructions provided? Yes  ?Medications obtained and verified? Yes  ?Other? No  ?Any new allergies since your discharge? No  ?Dietary orders reviewed? Yes ?Do you have support at home? Yes  ? ?Home Care and Equipment/Supplies: ?Were home health services ordered? no ? ?Functional Questionnaire: (I = Independent and D = Dependent) ?ADLs: I ? ?Bathing/Dressing- I ? ?Meal Prep- I ? ?Eating- I ? ?Maintaining continence- I ? ?Transferring/Ambulation- I ? ?Managing Meds- I ? ?Follow up appointments reviewed: ? ?PCP Hospital f/u appt confirmed? No   ?Specialist Hospital f/u appt confirmed? Yes  Scheduled to see Dr. Dema Severin on 01/24/22. ?Are transportation arrangements needed? No  ?If their condition worsens, is the pt aware to call PCP or go to the Emergency Dept.? Yes ?Was the patient provided with contact information for the PCP's office or ED? Yes ?Was to pt encouraged to call back with questions or concerns? Yes  ?

## 2022-01-02 DIAGNOSIS — G4733 Obstructive sleep apnea (adult) (pediatric): Secondary | ICD-10-CM | POA: Diagnosis not present

## 2022-01-05 ENCOUNTER — Other Ambulatory Visit (HOSPITAL_COMMUNITY): Payer: Self-pay

## 2022-01-24 DIAGNOSIS — G43009 Migraine without aura, not intractable, without status migrainosus: Secondary | ICD-10-CM | POA: Diagnosis not present

## 2022-01-24 DIAGNOSIS — G4733 Obstructive sleep apnea (adult) (pediatric): Secondary | ICD-10-CM | POA: Diagnosis not present

## 2022-01-27 IMAGING — CT CT ABD-PELV W/ CM
2 of 5 series · 17 of 46 positions shown, 19 images · IV contrast (Omnipaque)
Comparison: March 14, 2021

CLINICAL DATA: Diverticulitis suspected.  Pain.

EXAM:
CT ABDOMEN AND PELVIS WITH CONTRAST
TECHNIQUE: Multidetector CT imaging of the abdomen and pelvis was performed
using the standard protocol following bolus administration of
intravenous contrast.
CONTRAST:  100mL OMNIPAQUE IOHEXOL 300 MG/ML  SOLN

[Series 2: axial st · axial · 0.89mm/px · z∈[+882,+1292]mm · 14 of 92 slices shown, 16 images]
[im 5/92  soft-tissue]
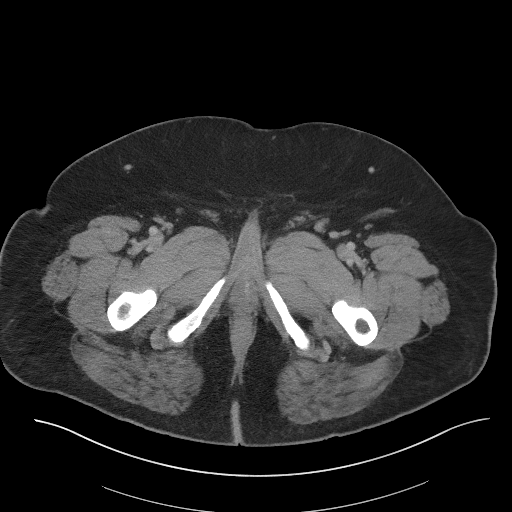
[im 5/92  bone]
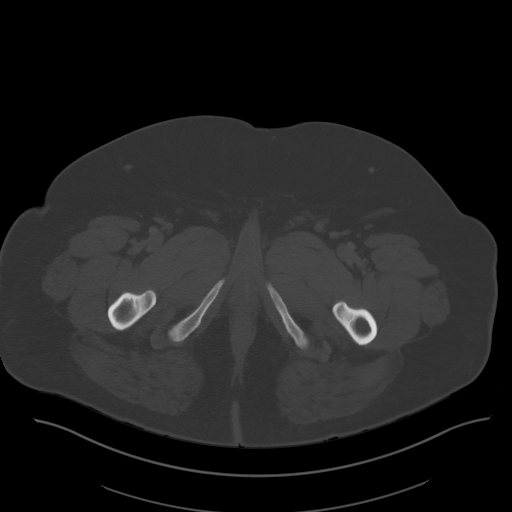
[im 10/92  soft-tissue]
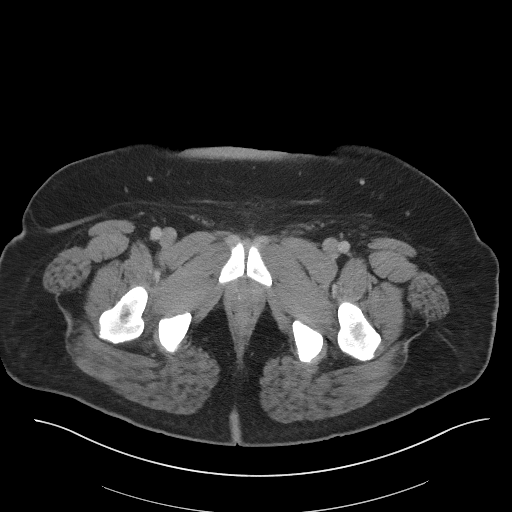
[im 20/92  soft-tissue]
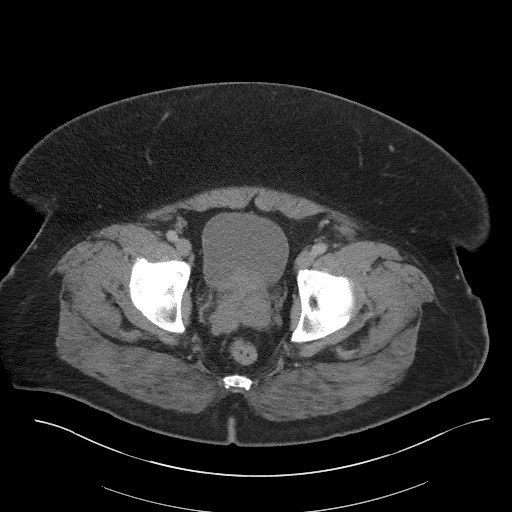
[im 24/92  soft-tissue]
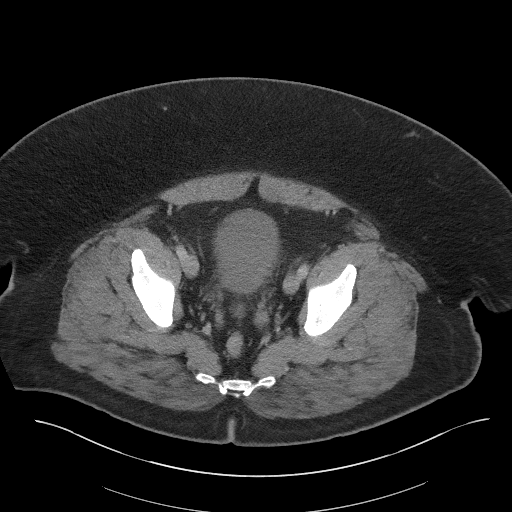
[im 29/92  soft-tissue]
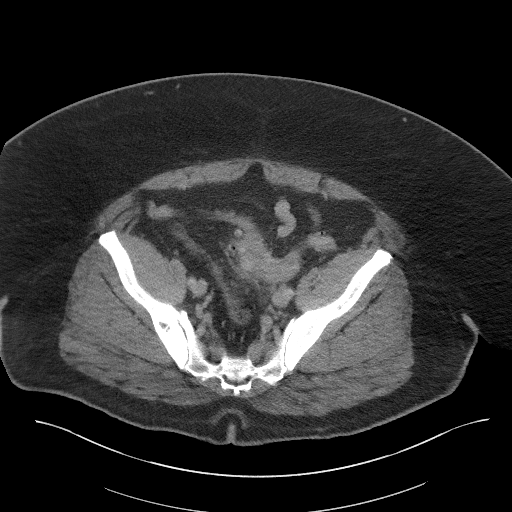
[im 39/92  soft-tissue]
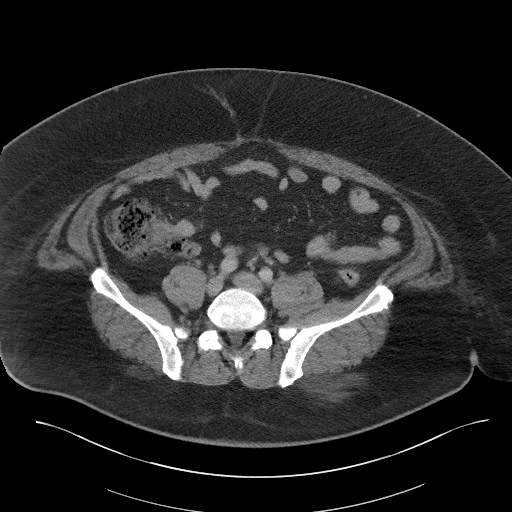
[im 44/92  soft-tissue]
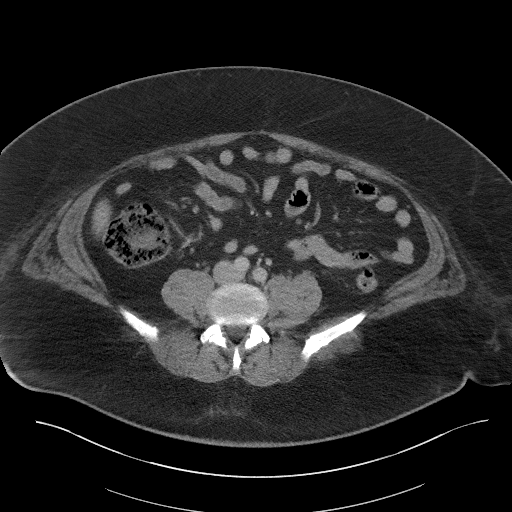
[im 48/92  soft-tissue]
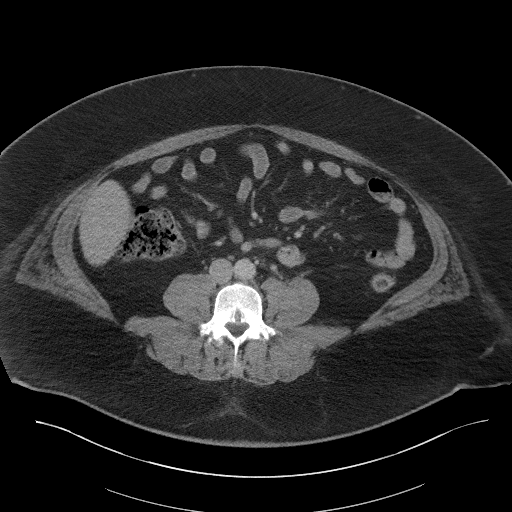
[im 53/92  soft-tissue]
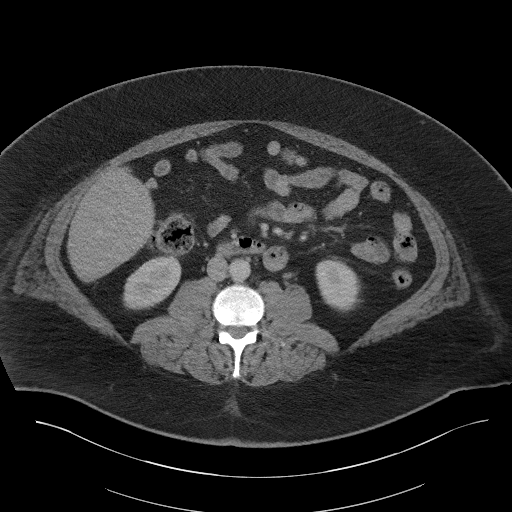
[im 53/92  bone]
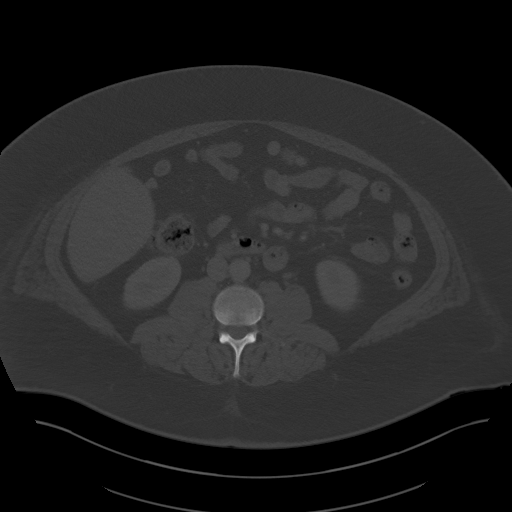
[im 63/92  soft-tissue]
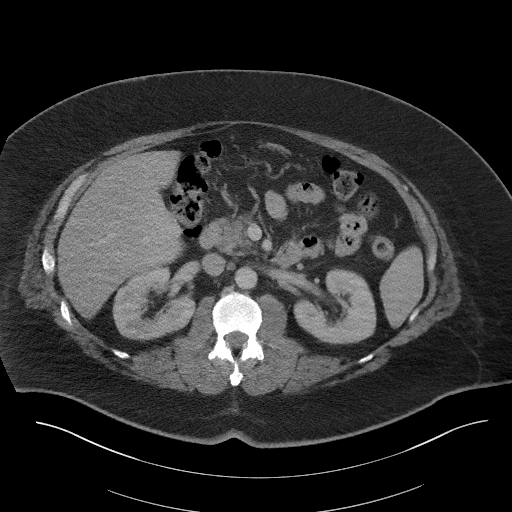
[im 68/92  soft-tissue]
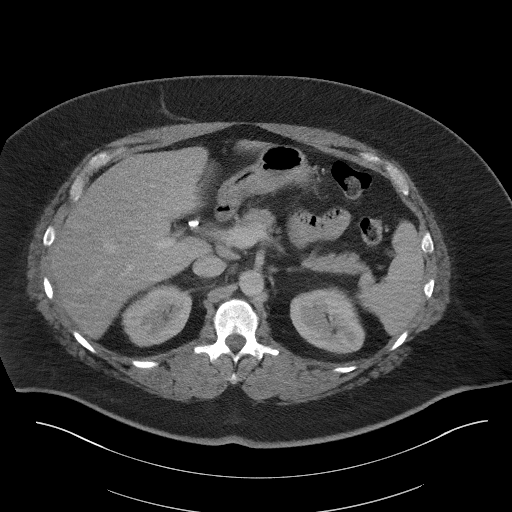
[im 72/92  soft-tissue]
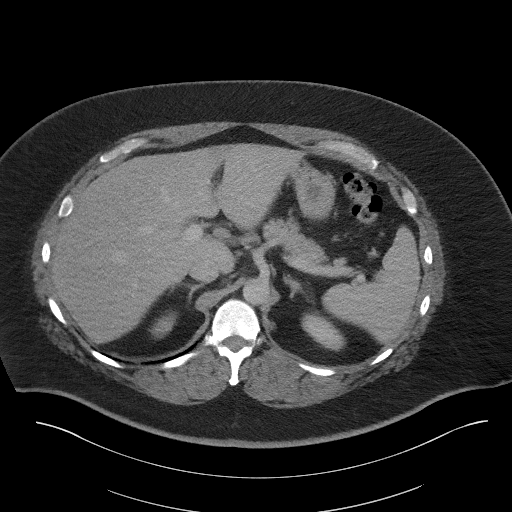
[im 82/92  soft-tissue]
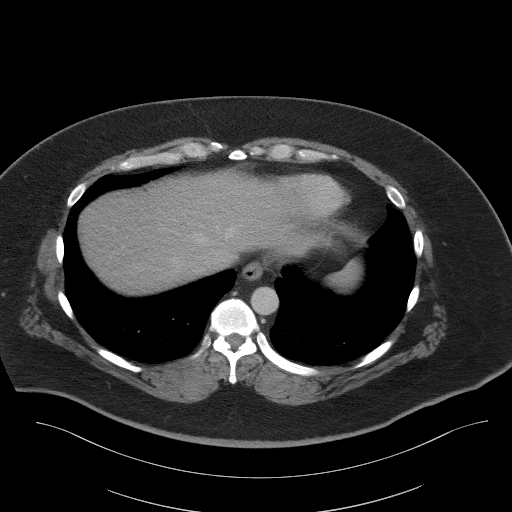
[im 87/92  soft-tissue]
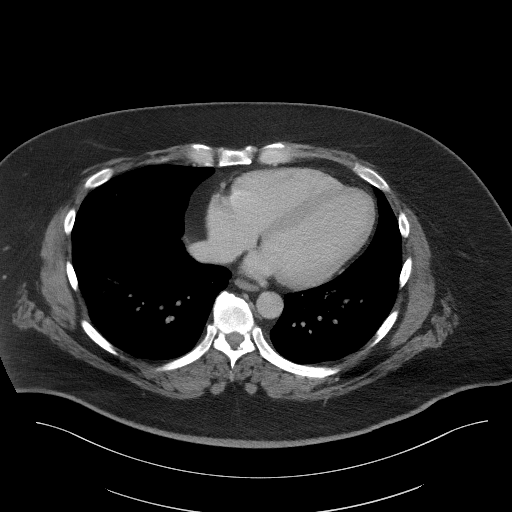

[Series 5: coronal st · coronal · 0.93mm/px · 3 of 107 slices shown]
[im 36/107  soft-tissue]
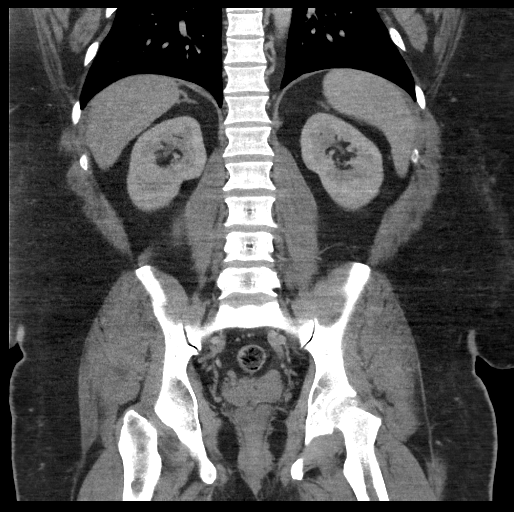
[im 48/107  soft-tissue]
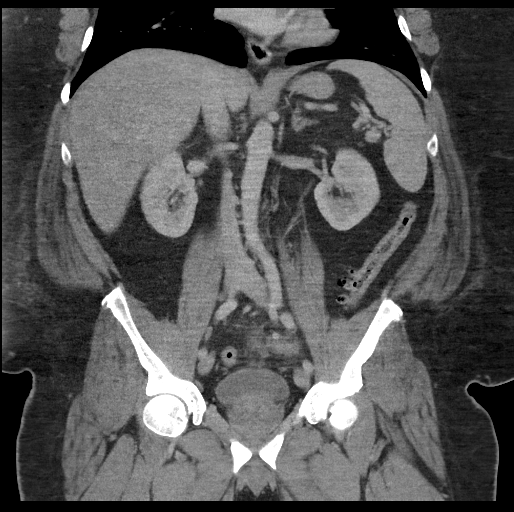
[im 59/107  soft-tissue]
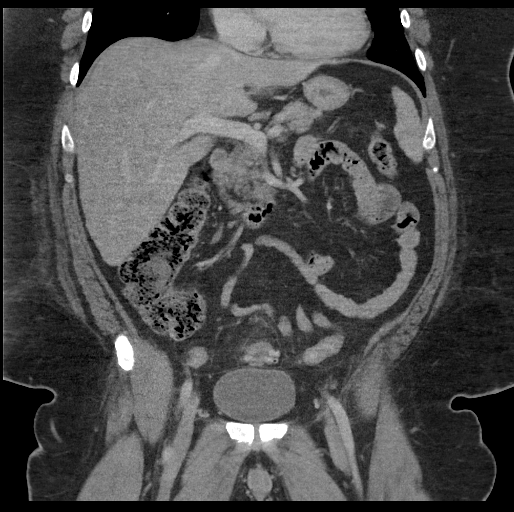

[17 of 46 positions shown; findings below may reference images not displayed]

FINDINGS: Lower chest: No acute abnormality.

Hepatobiliary: No focal liver abnormality is seen. Status post
cholecystectomy. No biliary dilatation.

Pancreas: Unremarkable. No pancreatic ductal dilatation or
surrounding inflammatory changes.

Spleen: Normal in size without focal abnormality.

Adrenals/Urinary Tract: Adrenal glands are normal. A few scattered
small stones are seen in the kidneys without hydronephrosis or
perinephric stranding. No suspicious renal masses. Ureters and
bladder are normal.

Stomach/Bowel: The stomach and small bowel are normal. There is
thickening of the proximal sigmoid colon with adjacent fat stranding
and multiple diverticula. No extraluminal gas or fluid collection
identified. The remainder of the colon is unremarkable. The appendix
is normal.

Vascular/Lymphatic: No significant vascular findings are present. No
enlarged abdominal or pelvic lymph nodes.

Reproductive: Prostate is unremarkable.

Other: No abdominal wall hernia or abnormality. No abdominopelvic
ascites.

Musculoskeletal: No acute or significant osseous findings.
IMPRESSION: 1. Sigmoid diverticulitis without extraluminal gas or fluid
collection. Approximately 7 cm of sigmoid colon appears to be
involved.
2. Nonobstructive stones in the kidneys.
3. No other abnormalities.

## 2022-02-01 DIAGNOSIS — G4733 Obstructive sleep apnea (adult) (pediatric): Secondary | ICD-10-CM | POA: Diagnosis not present

## 2022-03-04 DIAGNOSIS — G4733 Obstructive sleep apnea (adult) (pediatric): Secondary | ICD-10-CM | POA: Diagnosis not present

## 2022-03-30 ENCOUNTER — Ambulatory Visit: Payer: BC Managed Care – PPO | Admitting: Family Medicine

## 2022-04-03 DIAGNOSIS — G4733 Obstructive sleep apnea (adult) (pediatric): Secondary | ICD-10-CM | POA: Diagnosis not present

## 2022-04-21 ENCOUNTER — Ambulatory Visit: Payer: BC Managed Care – PPO | Admitting: Family Medicine

## 2022-05-03 DIAGNOSIS — J3489 Other specified disorders of nose and nasal sinuses: Secondary | ICD-10-CM | POA: Diagnosis not present

## 2022-05-03 DIAGNOSIS — R0981 Nasal congestion: Secondary | ICD-10-CM | POA: Diagnosis not present

## 2022-05-03 DIAGNOSIS — H1031 Unspecified acute conjunctivitis, right eye: Secondary | ICD-10-CM | POA: Diagnosis not present

## 2022-05-03 DIAGNOSIS — Z20822 Contact with and (suspected) exposure to covid-19: Secondary | ICD-10-CM | POA: Diagnosis not present

## 2022-05-04 DIAGNOSIS — G4733 Obstructive sleep apnea (adult) (pediatric): Secondary | ICD-10-CM | POA: Diagnosis not present

## 2022-05-11 ENCOUNTER — Ambulatory Visit (INDEPENDENT_AMBULATORY_CARE_PROVIDER_SITE_OTHER): Payer: BC Managed Care – PPO | Admitting: Family Medicine

## 2022-05-11 ENCOUNTER — Encounter: Payer: Self-pay | Admitting: Family Medicine

## 2022-05-11 VITALS — BP 111/72 | HR 60 | Ht 66.5 in | Wt 294.0 lb

## 2022-05-11 DIAGNOSIS — Z23 Encounter for immunization: Secondary | ICD-10-CM | POA: Diagnosis not present

## 2022-05-11 DIAGNOSIS — K5792 Diverticulitis of intestine, part unspecified, without perforation or abscess without bleeding: Secondary | ICD-10-CM | POA: Diagnosis not present

## 2022-05-11 DIAGNOSIS — E669 Obesity, unspecified: Secondary | ICD-10-CM

## 2022-05-11 DIAGNOSIS — E1169 Type 2 diabetes mellitus with other specified complication: Secondary | ICD-10-CM

## 2022-05-11 DIAGNOSIS — F4322 Adjustment disorder with anxiety: Secondary | ICD-10-CM | POA: Diagnosis not present

## 2022-05-11 LAB — POCT UA - MICROALBUMIN
Albumin/Creatinine Ratio, Urine, POC: <30
Creatinine, POC: 300 mg/dL
Microalbumin Ur, POC: 10 mg/L

## 2022-05-11 LAB — POCT GLYCOSYLATED HEMOGLOBIN (HGB A1C): HbA1c, POC (controlled diabetic range): 5.7 % (ref 0.0–7.0)

## 2022-05-11 NOTE — Assessment & Plan Note (Signed)
Diabetes is well controlled.  Encouraged to re-incorporate dietary changes and exercise.  I do think he would benefit from GLP-1 as well however he declines at this time.

## 2022-05-11 NOTE — Patient Instructions (Signed)
Try OTC allergy eye drops- zyrtec or pataday.  You may take cetirizine twice per day if needed for now.  Work on incorporation of more exercise and dietary changes.

## 2022-05-11 NOTE — Assessment & Plan Note (Signed)
He is doing well since his previous surgery.

## 2022-05-11 NOTE — Progress Notes (Signed)
[place] 51 y.o. male MRN [id number]  Date of birth: 12-Dec-1970  Subjective Chief Complaint  Patient presents with   Diabetes   Conjunctivitis    HPI Roberto Charles. Is a 51 y.o. male here today for follow up visit.   S/p sigmoidectomy due to diverticulitis in 01/2022.  He is doing well today.   Last a1c was 5.5 in January.  He was working on changes to diet.  Weight is up some since last visit.   He does plan to get back on track with dietary change and exercise.  He does not want to add medication back on at this time.   He was recently seen at urgent care for conjunctivitis.  Completed course of erythromycin ointment recently.  Still with mild itching.  No continued discharge  ROS:  A comprehensive ROS was completed and negative except as noted per HPI  Allergies  Allergen Reactions   Amoxicillin Other (See Comments)    Causes blood in intestines- Had once incidence of bloody diarrhea but thinks he has tolerated since this incidence     Past Medical History:  Diagnosis Date   Allergy    Anxiety    GERD (gastroesophageal reflux disease)    Headache(784.0)    History of kidney stones    Obesity    Sleep apnea    on BPAP since June    Past Surgical History:  Procedure Laterality Date   CHOLECYSTECTOMY     FLEXIBLE SIGMOIDOSCOPY N/A 12/23/2021   Procedure: FLEXIBLE SIGMOIDOSCOPY;  Surgeon: Ileana Roup, MD;  Location: [facility];  Service: General;  Laterality: N/A;   [place]   right hand surgery      VASECTOMY  2010   XI ROBOTIC ASSISTED LOWER ANTERIOR RESECTION N/A 12/23/2021   Procedure: XI ROBOTIC ASSISTED LOWER ANTERIOR RESECTION, BILATERAL TAP BLOCK;  Surgeon: Ileana Roup, MD;  Location: [facility];  Service: General;  Laterality: N/A;    Social History   Socioeconomic History   Marital status: Legally Separated    Spouse name: Not on file   Number of children: 2   Years of education: Not on file   Highest  education level: Not on file  Occupational History   Occupation: Insurance  Tobacco Use   Smoking status: Never   Smokeless tobacco: Never  Vaping Use   Vaping Use: Never used  Substance and Sexual Activity   Alcohol use: Yes    Alcohol/week: 2.0 standard drinks of alcohol    Types: 2 Standard drinks or equivalent per week    Comment: social   Drug use: No   Sexual activity: Yes    Partners: Female    Birth control/protection: Other-see comments    Comment: Vasectomy  Other Topics Concern   Not on file  Social History Narrative   Not on file   Social Determinants of Health   Financial Resource Strain: Not on file  Food Insecurity: Not on file  Transportation Needs: Not on file  Physical Activity: Not on file  Stress: Not on file  Social Connections: Not on file    Family History  Problem Relation Age of Onset   Hypertension Father    Colon polyps Father    Uterine cancer Maternal Aunt    Testicular cancer Paternal Uncle    Uterine cancer Maternal Grandmother    Colon cancer Neg Hx    Pancreatic cancer Neg Hx    Stomach cancer Neg Hx  Liver cancer Neg Hx    Esophageal cancer Neg Hx    Rectal cancer Neg Hx     Health Maintenance  Topic Date Due   Diabetic kidney evaluation - Urine ACR  Never done   Hepatitis C Screening  Never done   Zoster Vaccines- Shingrix (1 of 2) Never done   OPHTHALMOLOGY EXAM  01/26/2022   HEMOGLOBIN A1C  03/30/2022   INFLUENZA VACCINE  04/05/2022   FOOT EXAM  09/30/2022   Diabetic kidney evaluation - GFR measurement  12/27/2022   TETANUS/TDAP  01/15/2028   COLONOSCOPY (Pts 45-38yr Insurance coverage will need to be confirmed)  05/22/2031   COVID-19 Vaccine  Completed   HIV Screening  Completed   HPV VACCINES  Aged Out      ----------------------------------------------------------------------------------------------------------------------------------------------------------------------------------------------------------------- Physical Exam BP 111/72 (BP Location: Left Arm, Patient Position: Sitting, Cuff Size: Large)   Pulse 60   Ht 5' 6.5" (1.689 m)   Wt 294 lb 0.6 oz (133.4 kg)   SpO2 94%   BMI 46.75 kg/m   Physical Exam Constitutional:      Appearance: Normal appearance.  HENT:     Head: Normocephalic and atraumatic.     Mouth/Throat:     Mouth: Mucous membranes are moist.  Eyes:     General: No scleral icterus.    Conjunctiva/sclera: Conjunctivae normal.  Cardiovascular:     Rate and Rhythm: Normal rate and regular rhythm.  Pulmonary:     Effort: Pulmonary effort is normal.     Breath sounds: Normal breath sounds.  Neurological:     Mental Status: He is alert.  Psychiatric:        Mood and Affect: Mood normal.        Behavior: Behavior normal.     ------------------------------------------------------------------------------------------------------------------------------------------------------------------------------------------------------------------- Assessment and Plan  Diabetes mellitus type 2 in obese ([place] Diabetes is well controlled.  Encouraged to re-incorporate dietary changes and exercise.  I do think he would benefit from GLP-1 as well however he declines at this time.    Diverticulitis s/p robotic rectosigmoid resection 12/23/2021 He is doing well since his previous surgery.     No orders of the defined types were placed in this encounter.   Return in about 6 months (around 11/09/2022) for T2DM.    This visit occurred during the SARS-CoV-2 public health emergency.  Safety protocols were in place, including screening questions prior to the visit, additional usage of staff PPE, and extensive cleaning of exam room while observing appropriate contact time as  indicated for disinfecting solutions.

## 2022-05-25 ENCOUNTER — Other Ambulatory Visit: Payer: Self-pay | Admitting: Osteopathic Medicine

## 2022-05-26 NOTE — Telephone Encounter (Signed)
[facility] a med refill for metformin. Rx not listed in active med list.

## 2022-06-02 DIAGNOSIS — E119 Type 2 diabetes mellitus without complications: Secondary | ICD-10-CM | POA: Diagnosis not present

## 2022-06-02 LAB — HM DIABETES EYE EXAM

## 2022-06-04 DIAGNOSIS — G4733 Obstructive sleep apnea (adult) (pediatric): Secondary | ICD-10-CM | POA: Diagnosis not present

## 2022-06-30 DIAGNOSIS — H8111 Benign paroxysmal vertigo, right ear: Secondary | ICD-10-CM | POA: Diagnosis not present

## 2022-07-12 DIAGNOSIS — G4733 Obstructive sleep apnea (adult) (pediatric): Secondary | ICD-10-CM | POA: Diagnosis not present

## 2022-08-11 DIAGNOSIS — G4733 Obstructive sleep apnea (adult) (pediatric): Secondary | ICD-10-CM | POA: Diagnosis not present

## 2022-08-17 ENCOUNTER — Encounter: Payer: Self-pay | Admitting: Sports Medicine

## 2022-08-17 ENCOUNTER — Ambulatory Visit (INDEPENDENT_AMBULATORY_CARE_PROVIDER_SITE_OTHER): Payer: BC Managed Care – PPO | Admitting: Sports Medicine

## 2022-08-17 VITALS — BP 112/74 | HR 76 | Wt 294.0 lb

## 2022-08-17 DIAGNOSIS — R319 Hematuria, unspecified: Secondary | ICD-10-CM

## 2022-08-17 DIAGNOSIS — R310 Gross hematuria: Secondary | ICD-10-CM

## 2022-08-17 DIAGNOSIS — Z6841 Body Mass Index (BMI) 40.0 and over, adult: Secondary | ICD-10-CM | POA: Diagnosis not present

## 2022-08-17 LAB — POCT URINALYSIS DIP (CLINITEK)
Bilirubin, UA: NEGATIVE
Blood, UA: NEGATIVE
Glucose, UA: NEGATIVE mg/dL
Ketones, POC UA: NEGATIVE mg/dL
Leukocytes, UA: NEGATIVE
Nitrite, UA: NEGATIVE
POC PROTEIN,UA: NEGATIVE
Spec Grav, UA: >=1.03 — AB (ref 1.010–1.025)
Urobilinogen, UA: 0.2 E.U./dL
pH, UA: 5.5 (ref 5.0–8.0)

## 2022-08-17 NOTE — Assessment & Plan Note (Signed)
He does have morbid obesity with multiple medical comorbidities, I did suggest to him that he should consider bariatric surgery, he will let us know.

## 2022-08-17 NOTE — Assessment & Plan Note (Signed)
This is a very pleasant 51 year old male, he has had episodes of hematuria in the past, the last time it looks like was 2021. He had a more recent episode today where he urinated a small amount of blood and then it stopped resolved, urinalysis today is negative. No trauma, no overt obstructive symptoms, no symptoms of infection. On review of a CT scan from last year he does have scattered intrarenal stones which I think are responsible. Exam is normal. I do think he deserves a renal ultrasound to ensure that were not missing something here, he can follow-up with his PCP and simply watch this.

## 2022-08-17 NOTE — Progress Notes (Signed)
    Procedures performed today:    None.  Independent interpretation of notes and tests performed by another provider:   None.  Brief History, Exam, Impression, and Recommendations:    Hematuria This is a very pleasant 51 year old male, he has had episodes of hematuria in the past, the last time it looks like was 2021. He had a more recent episode today where he urinated a small amount of blood and then it stopped resolved, urinalysis today is negative. No trauma, no overt obstructive symptoms, no symptoms of infection. On review of a CT scan from last year he does have scattered intrarenal stones which I think are responsible. Exam is normal. I do think he deserves a renal ultrasound to ensure that were not missing something here, he can follow-up with his PCP and simply watch this.  Morbid obesity with BMI of 45.0-49.9, adult [facility]) He does have morbid obesity with multiple medical comorbidities, I did suggest to him that he should consider bariatric surgery, he will let us know.    ____________________________________________ Gwen Her. Dianah Field, M.D., ABFM., CAQSM., AME. Primary Care and Sports Medicine [place] [facility]  Adjunct Professor of [employer] [facility] [employer]  Risk manager

## 2022-08-18 ENCOUNTER — Ambulatory Visit (INDEPENDENT_AMBULATORY_CARE_PROVIDER_SITE_OTHER): Payer: BC Managed Care – PPO

## 2022-08-18 DIAGNOSIS — R310 Gross hematuria: Secondary | ICD-10-CM

## 2022-08-18 DIAGNOSIS — R319 Hematuria, unspecified: Secondary | ICD-10-CM | POA: Diagnosis not present

## 2022-09-08 ENCOUNTER — Ambulatory Visit: Payer: BC Managed Care – PPO | Admitting: Family Medicine

## 2022-09-20 ENCOUNTER — Encounter: Payer: Self-pay | Admitting: Sports Medicine

## 2022-09-20 ENCOUNTER — Ambulatory Visit: Payer: BC Managed Care – PPO | Admitting: Family Medicine

## 2022-09-20 ENCOUNTER — Ambulatory Visit (INDEPENDENT_AMBULATORY_CARE_PROVIDER_SITE_OTHER): Payer: No Typology Code available for payment source | Admitting: Sports Medicine

## 2022-09-20 VITALS — BP 127/78 | HR 90

## 2022-09-20 DIAGNOSIS — J111 Influenza due to unidentified influenza virus with other respiratory manifestations: Secondary | ICD-10-CM | POA: Insufficient documentation

## 2022-09-20 LAB — POCT INFLUENZA A/B
Influenza A, POC: NEGATIVE
Influenza B, POC: NEGATIVE

## 2022-09-20 MED ORDER — NIRMATRELVIR/RITONAVIR (PAXLOVID)TABLET: ORAL_TABLET | ORAL | 0 refills | Status: DC

## 2022-09-20 MED ORDER — MELOXICAM 15 MG PO TABS: ORAL_TABLET | ORAL | 3 refills | Status: DC

## 2022-09-20 MED ORDER — HYDROCOD POLI-CHLORPHE POLI ER 10-8 MG/5ML PO SUER: 5.0000 mL | Freq: Two times a day (BID) | ORAL | 0 refills | Status: DC | PRN

## 2022-09-20 NOTE — Assessment & Plan Note (Addendum)
Pleasant 52 year old male with diabetes mellitus type 2, he has had about a 1 day history of fevers up to 101 Fahrenheit, muscle aches, body aches, headache, cough, sore throat. Flu test was negative today. He did do a COVID test at home on day one of the illness which may have been a bit early so I am going to have him test again today and tomorrow at home, I am also going to write him a prescription for Paxlovid that he can hold onto and only fill if his COVID test comes back positive. In the meantime he will do meloxicam for pain, Tussionex at night for coughing, he will hold off on NyQuil at night. He can do DayQuil through the day with meloxicam. Return to see [place] if not better in a week or 2.

## 2022-09-20 NOTE — Progress Notes (Signed)
    Procedures performed today:    None.  Independent interpretation of notes and tests performed by another provider:   None.  Brief History, Exam, Impression, and Recommendations:    Influenza-like illness Pleasant 52 year old male with diabetes mellitus type 2, he has had about a 1 day history of fevers up to 101 Fahrenheit, muscle aches, body aches, headache, cough, sore throat. Flu test was negative today. He did do a COVID test at home on day one of the illness which may have been a bit early so I am going to have him test again today and tomorrow at home, I am also going to write him a prescription for Paxlovid that he can hold onto and only fill if his COVID test comes back positive. In the meantime he will do meloxicam for pain, Tussionex at night for coughing, he will hold off on NyQuil at night. He can do DayQuil through the day with meloxicam. Return to see [place] if not better in a week or 2.  Acute illness with systemic symptoms  ____________________________________________ Gwen Her. Dianah Field, M.D., ABFM., CAQSM., AME. Primary Care and Sports Medicine [facility]  Adjunct Professor of [employer] [facility] [employer]  Risk manager

## 2022-09-21 ENCOUNTER — Encounter: Payer: Self-pay | Admitting: Sports Medicine

## 2022-09-27 ENCOUNTER — Encounter: Payer: Self-pay | Admitting: Family Medicine

## 2022-09-27 ENCOUNTER — Ambulatory Visit (INDEPENDENT_AMBULATORY_CARE_PROVIDER_SITE_OTHER): Payer: No Typology Code available for payment source | Admitting: Family Medicine

## 2022-09-27 VITALS — BP 108/69 | HR 63 | Ht 66.5 in | Wt 294.0 lb

## 2022-09-27 DIAGNOSIS — N400 Enlarged prostate without lower urinary tract symptoms: Secondary | ICD-10-CM

## 2022-09-27 DIAGNOSIS — R319 Hematuria, unspecified: Secondary | ICD-10-CM

## 2022-09-27 NOTE — Patient Instructions (Signed)

## 2022-09-27 NOTE — Assessment & Plan Note (Signed)
Renal US with minimally complex cyst.  I don't think additional imaging is warranted at this time.  I am checking a PSA and rechecking UA and sending for culture.  Urine cytology ordered.  Low threshold to refer to urology if continues to have episodes of hematuria.

## 2022-09-27 NOTE — Progress Notes (Signed)
Roberto Charles. - 52 y.o. male MRN [id number]  Date of birth: 1971-01-29  Subjective Chief Complaint  Patient presents with  . Cyst    HPI Roberto Charles. Is a 52 y.o. male here today for follow up of recent visit for hematuria.  Seen by Dr. Dianah Field and UA with renal US ordered.  UA was unremarkable.  [place] 2.9cm simple/minimally complex cyst.  Noted to have enlarged prostate as well.  He had another episode of hematuria last week as well as sensation that he couldn't hold his urine.  He denies pain with urination, flank pain, fever or chills.   ROS:  A comprehensive ROS was completed and negative except as noted per HPI  Allergies  Allergen Reactions  . Amoxicillin Other (See Comments)    Causes blood in intestines- Had once incidence of bloody diarrhea but thinks he has tolerated since this incidence     Past Medical History:  Diagnosis Date  . Allergy   . Anxiety   . GERD (gastroesophageal reflux disease)   . Headache(784.0)   . History of kidney stones   . Obesity   . Sleep apnea    on BPAP since June    Past Surgical History:  Procedure Laterality Date  . CHOLECYSTECTOMY    . FLEXIBLE SIGMOIDOSCOPY N/A 12/23/2021   Procedure: FLEXIBLE SIGMOIDOSCOPY;  Surgeon: Ileana Roup, MD;  Location: [facility];  Service: General;  Laterality: N/A;  . [place]  . right hand surgery     . VASECTOMY  2010  . XI ROBOTIC ASSISTED LOWER ANTERIOR RESECTION N/A 12/23/2021   Procedure: XI ROBOTIC ASSISTED LOWER ANTERIOR RESECTION, BILATERAL TAP BLOCK;  Surgeon: Ileana Roup, MD;  Location: [facility];  Service: General;  Laterality: N/A;    Social History   Socioeconomic History  . Marital status: Legally Separated    Spouse name: Not on file  . Number of children: 2  . Years of education: Not on file  . Highest education level: Not on file  Occupational History  . Occupation: Insurance underwriter  Tobacco Use  . Smoking status: Never  . Smokeless tobacco:  Never  Vaping Use  . Vaping Use: Never used  Substance and Sexual Activity  . Alcohol use: Yes    Alcohol/week: 2.0 standard drinks of alcohol    Types: 2 Standard drinks or equivalent per week    Comment: social  . Drug use: No  . Sexual activity: Yes    Partners: Female    Birth control/protection: Other-see comments    Comment: Vasectomy  Other Topics Concern  . Not on file  Social History Narrative  . Not on file   Social Determinants of Health   Financial Resource Strain: Not on file  Food Insecurity: Not on file  Transportation Needs: Not on file  Physical Activity: Not on file  Stress: Not on file  Social Connections: Not on file    Family History  Problem Relation Age of Onset  . Hypertension Father   . Colon polyps Father   . Uterine cancer Maternal Aunt   . Testicular cancer Paternal Uncle   . Uterine cancer Maternal Grandmother   . Colon cancer Neg Hx   . Pancreatic cancer Neg Hx   . Stomach cancer Neg Hx   . Liver cancer Neg Hx   . Esophageal cancer Neg Hx   . Rectal cancer Neg Hx     Health Maintenance  Topic Date Due  .  OPHTHALMOLOGY EXAM  10/06/2022 (Originally 01/26/2022)  . Zoster Vaccines- Shingrix (2 of 2) 12/27/2022 (Originally 07/06/2022)  . Hepatitis C Screening  05/12/2023 (Originally [birth year]/1989)  . COVID-19 Vaccine (4 - 2023-24 season) 10/14/2023 (Originally 05/06/2022)  . FOOT EXAM  09/30/2022  . HEMOGLOBIN A1C  11/09/2022  . Diabetic kidney evaluation - eGFR measurement  12/27/2022  . Diabetic kidney evaluation - Urine ACR  05/12/2023  . DTaP/Tdap/Td (3 - Td or Tdap) 01/15/2028  . COLONOSCOPY (Pts 45-73yr Insurance coverage will need to be confirmed)  05/22/2031  . INFLUENZA VACCINE  Completed  . HIV Screening  Completed  . HPV VACCINES  Aged Out      ----------------------------------------------------------------------------------------------------------------------------------------------------------------------------------------------------------------- Physical Exam BP 108/69 (BP Location: Left Arm, Patient Position: Sitting, Cuff Size: Large)   Pulse 63   Ht 5' 6.5" (1.689 m)   Wt 294 lb (133.4 kg)   SpO2 97%   BMI 46.74 kg/m   Physical Exam Constitutional:      Appearance: Normal appearance.  HENT:     Head: Normocephalic and atraumatic.  Eyes:     General: No scleral icterus. Neurological:     Mental Status: He is alert.  Psychiatric:        Mood and Affect: Mood normal.        Behavior: Behavior normal.    ------------------------------------------------------------------------------------------------------------------------------------------------------------------------------------------------------------------- Assessment and Plan  Hematuria [place] minimally complex cyst.  I don't think additional imaging is warranted at this time.  I am checking a PSA and rechecking UA and sending for culture.  Urine cytology ordered.  Low threshold to refer to urology if continues to have episodes of hematuria.    No orders of the defined types were placed in this encounter.   No follow-ups on file.    This visit occurred during the SARS-CoV-2 public health emergency.  Safety protocols were in place, including screening questions prior to the visit, additional usage of staff PPE, and extensive cleaning of exam room while observing appropriate contact time as indicated for disinfecting solutions.

## 2022-09-29 ENCOUNTER — Encounter: Payer: Self-pay | Admitting: Family Medicine

## 2022-09-30 ENCOUNTER — Other Ambulatory Visit: Payer: Self-pay | Admitting: Medical-Surgical

## 2022-09-30 DIAGNOSIS — R972 Elevated prostate specific antigen [PSA]: Secondary | ICD-10-CM

## 2022-09-30 DIAGNOSIS — R319 Hematuria, unspecified: Secondary | ICD-10-CM

## 2022-09-30 DIAGNOSIS — N400 Enlarged prostate without lower urinary tract symptoms: Secondary | ICD-10-CM

## 2022-09-30 LAB — URINALYSIS, ROUTINE W REFLEX MICROSCOPIC
Bilirubin Urine: NEGATIVE
Glucose, UA: NEGATIVE
Hgb urine dipstick: NEGATIVE
Ketones, ur: NEGATIVE
Leukocytes,Ua: NEGATIVE
Nitrite: NEGATIVE
Protein, ur: NEGATIVE
Specific Gravity, Urine: 1.025 (ref 1.001–1.035)
pH: 5.5 (ref 5.0–8.0)

## 2022-09-30 LAB — URINE CULTURE
MICRO NUMBER:: 14461911
Result:: NO GROWTH
SPECIMEN QUALITY:: ADEQUATE

## 2022-09-30 LAB — PSA, TOTAL AND FREE
PSA, % Free: 12 % (calc) — ABNORMAL LOW (ref >25)
PSA, Free: 1 ng/mL
PSA, Total: 8.5 ng/mL — ABNORMAL HIGH (ref <=4.0)

## 2022-10-12 ENCOUNTER — Ambulatory Visit (INDEPENDENT_AMBULATORY_CARE_PROVIDER_SITE_OTHER): Payer: No Typology Code available for payment source | Admitting: Urology

## 2022-10-12 ENCOUNTER — Encounter: Payer: Self-pay | Admitting: Urology

## 2022-10-12 VITALS — BP 119/78 | HR 75 | Ht 66.0 in | Wt 297.0 lb

## 2022-10-12 DIAGNOSIS — R972 Elevated prostate specific antigen [PSA]: Secondary | ICD-10-CM

## 2022-10-12 DIAGNOSIS — N200 Calculus of kidney: Secondary | ICD-10-CM

## 2022-10-12 DIAGNOSIS — R310 Gross hematuria: Secondary | ICD-10-CM

## 2022-10-12 LAB — URINALYSIS
Bilirubin, UA: NEGATIVE
Glucose, UA: NEGATIVE mg/dL
Ketones, UA: NEGATIVE
Nitrite, UA: NEGATIVE
Protein, UA: NEGATIVE
Spec Grav, UA: >=1.03 — AB (ref 1.010–1.025)
Urobilinogen, UA: 0.2 E.U./dL
pH, UA: 5.5 (ref 5.0–8.0)

## 2022-10-12 NOTE — Progress Notes (Signed)
Assessment: 1. Gross hematuria   2. Elevated PSA   3. Nephrolithiasis     Plan: I personally reviewed the patient's chart including provider notes, lab results, and imaging results. I personally reviewed the renal ultrasound from 08/18/2022 with results as noted below. Today I had a discussion with the patient regarding the findings of gross hematuria including the implications and differential diagnoses associated with it.  I also discussed recommendations for further evaluation including the rationale for upper tract imaging and cystoscopy.  I discussed the nature of these procedures including potential risk and complications.  The patient expressed an understanding of these issues. Recommend further evaluation with CT hematuria protocol followed by cystoscopy  Today I had a long discussion with the patient regarding PSA and the rationale and controversies of prostate cancer early detection.  I discussed the pros and cons of further evaluation including TRUS and prostate Bx.  Potential adverse events and complications as well as standard instructions were given.  Patient expressed his understanding of these issues. Recommend further evaluation with TRUS/BX after hematuria evaluation completed   Chief Complaint:  Chief Complaint  Patient presents with   Elevated PSA   Hematuria    History of Present Illness:  Roberto Charles. is a 52 y.o. male who is seen in consultation from Luetta Nutting, DO for evaluation of gross hematuria and elevated PSA. He has a recent history of intermittent gross hematuria.  An episode occurred in November 2023.  He reports passing with urine at that time.  No flank pain or dysuria.  The hematuria resolved spontaneously.  Dipstick urinalysis from 09/27/2022 was negative.  Urine culture showed no growth.  He reports 2-3 episodes of gross hematuria since that time.  He previously underwent CT imaging in November 2022 which showed a few scattered stones in  both kidneys without hydronephrosis, no ureteral calculi, and no suspicious renal masses.  Renal ultrasound from 08/18/2022 showed a 9 mm echogenic focus within the upper pole of the right kidney felt to represent a stone, 2.9 cm hypoechoic structure in the interpolar right kidney felt to represent a cyst, and mild prostatomegaly. He reports occasional bilateral low back pain. He has a prior history of kidney stones passing approximately 7 stones in total.  He has not required any surgical management for kidney stones.  He last passed a stone in December 2023.  He was found to have an elevated PSA in January 2024.  PSA was 8.5 with 12% free. No prior PSA results available for review.  No history of prostatitis or UTIs.  No family history of prostate cancer.  He currently reports a intermittent slow urine stream.  He he has nocturia 0-1 time.  He does have occasional incontinence associated with coughing. IPSS = 7.   Past Medical History:  Past Medical History:  Diagnosis Date   Allergy    Anxiety    GERD (gastroesophageal reflux disease)    Headache(784.0)    History of kidney stones    Obesity    Sleep apnea    on BPAP since June    Past Surgical History:  Past Surgical History:  Procedure Laterality Date   CHOLECYSTECTOMY     FLEXIBLE SIGMOIDOSCOPY N/A 12/23/2021   Procedure: FLEXIBLE SIGMOIDOSCOPY;  Surgeon: Ileana Roup, MD;  Location: [facility];  Service: General;  Laterality: N/A;   [place]   right hand surgery      VASECTOMY  2010   XI ROBOTIC ASSISTED LOWER ANTERIOR  RESECTION N/A 12/23/2021   Procedure: XI ROBOTIC ASSISTED LOWER ANTERIOR RESECTION, BILATERAL TAP BLOCK;  Surgeon: Ileana Roup, MD;  Location: [facility];  Service: General;  Laterality: N/A;    Allergies:  Allergies  Allergen Reactions   Amoxicillin Other (See Comments)    Causes blood in intestines- Had once incidence of bloody diarrhea but thinks he has tolerated since this  incidence     Family History:  Family History  Problem Relation Age of Onset   Hypertension Father    Colon polyps Father    Uterine cancer Maternal Aunt    Testicular cancer Paternal Uncle    Uterine cancer Maternal Grandmother    Colon cancer Neg Hx    Pancreatic cancer Neg Hx    Stomach cancer Neg Hx    Liver cancer Neg Hx    Esophageal cancer Neg Hx    Rectal cancer Neg Hx     Social History:  Social History   Tobacco Use   Smoking status: Never   Smokeless tobacco: Never  Vaping Use   Vaping Use: Never used  Substance Use Topics   Alcohol use: Yes    Alcohol/week: 2.0 standard drinks of alcohol    Types: 2 Standard drinks or equivalent per week    Comment: social   Drug use: No    Review of symptoms:  Constitutional:  Negative for unexplained weight loss, night sweats, fever, chills ENT:  Negative for nose bleeds, sinus pain, painful swallowing CV:  Negative for chest pain, shortness of breath, exercise intolerance, palpitations, loss of consciousness Resp:  Negative for cough, wheezing, shortness of breath GI:  Negative for nausea, vomiting, diarrhea, bloody stools GU:  Positives noted in HPI; otherwise negative for dysuria Neuro:  Negative for seizures, poor balance, limb weakness, slurred speech Psych:  Negative for lack of energy, depression, anxiety Endocrine:  Negative for polydipsia, polyuria, symptoms of hypoglycemia (dizziness, hunger, sweating) Hematologic:  Negative for anemia, purpura, petechia, prolonged or excessive bleeding, use of anticoagulants  Allergic:  Negative for difficulty breathing or choking as a result of exposure to anything; no shellfish allergy; no allergic response (rash/itch) to materials, foods  Physical exam: BP 119/78   Pulse 75   Ht '5\' 6"'$  (1.676 m)   Wt 297 lb (134.7 kg)   BMI 47.94 kg/m  GENERAL APPEARANCE:  Well appearing, well developed, well nourished, NAD HEENT: Atraumatic, Normocephalic, oropharynx clear. NECK:  Supple without lymphadenopathy or thyromegaly. LUNGS: Clear to auscultation bilaterally. HEART: Regular Rate and Rhythm without murmurs, gallops, or rubs. ABDOMEN: Soft, non-tender, No Masses. EXTREMITIES: Moves all extremities well.  Without clubbing, cyanosis, or edema. NEUROLOGIC:  Alert and oriented x 3, normal gait, CN II-XII grossly intact.  MENTAL STATUS:  Appropriate. BACK:  Non-tender to palpation.  No CVAT SKIN:  Warm, dry and intact.   GU: Penis:  uncircumcised Meatus: Normal Scrotum: normal, no masses Testis: normal without masses bilateral Prostate: 40 g, NT, no nodules Rectum: Normal tone,  no masses or tenderness   Results: U/A dipstick:  trace blood, trace LE

## 2022-10-18 ENCOUNTER — Ambulatory Visit (HOSPITAL_BASED_OUTPATIENT_CLINIC_OR_DEPARTMENT_OTHER)
Admission: RE | Admit: 2022-10-18 | Discharge: 2022-10-18 | Disposition: A | Discharge status: Home / Self Care | Payer: No Typology Code available for payment source | Source: Ambulatory Visit | Attending: Urology | Admitting: Urology

## 2022-10-18 DIAGNOSIS — R310 Gross hematuria: Secondary | ICD-10-CM | POA: Insufficient documentation

## 2022-10-18 MED ORDER — IOHEXOL 300 MG/ML  SOLN: 125.0000 mL | Freq: Once | INTRAMUSCULAR | Status: DC | PRN

## 2022-10-21 ENCOUNTER — Encounter: Payer: Self-pay | Admitting: Urology

## 2022-10-21 ENCOUNTER — Ambulatory Visit (INDEPENDENT_AMBULATORY_CARE_PROVIDER_SITE_OTHER): Payer: No Typology Code available for payment source | Admitting: Urology

## 2022-10-21 ENCOUNTER — Other Ambulatory Visit: Payer: Self-pay

## 2022-10-21 VITALS — BP 113/78 | HR 75 | Ht 66.0 in | Wt 298.0 lb

## 2022-10-21 DIAGNOSIS — R310 Gross hematuria: Secondary | ICD-10-CM | POA: Diagnosis not present

## 2022-10-21 DIAGNOSIS — R972 Elevated prostate specific antigen [PSA]: Secondary | ICD-10-CM

## 2022-10-21 DIAGNOSIS — N200 Calculus of kidney: Secondary | ICD-10-CM | POA: Diagnosis not present

## 2022-10-21 LAB — URINALYSIS
Bilirubin, UA: NEGATIVE
Glucose, UA: NEGATIVE mg/dL
Ketones, UA: NEGATIVE
Leukocytes, UA: NEGATIVE
Nitrite, UA: NEGATIVE
Protein, UA: NEGATIVE
Spec Grav, UA: 1.025 (ref 1.010–1.025)
Urobilinogen, UA: 0.2 E.U./dL
pH, UA: 6 (ref 5.0–8.0)

## 2022-10-21 MED ORDER — CIPROFLOXACIN HCL 500 MG PO TABS
500.0000 mg | ORAL_TABLET | Freq: Once | ORAL | Status: AC
  Administered 2022-10-21: 500 mg via ORAL

## 2022-10-21 NOTE — Progress Notes (Signed)
Assessment: 1. Gross hematuria   2. Elevated PSA   3. Nephrolithiasis     Plan: I personally viewed the CT study from 10/19/2022 with results as noted below. I discussed these results as well as the findings on cystoscopy with the patient today. Urine cytology sent today Cipro x 1 following cystoscopy Today I had a long discussion with the patient regarding PSA and the rationale and controversies of prostate cancer early detection.  I discussed the pros and cons of further evaluation including TRUS and prostate Bx.  Potential adverse events and complications as well as standard instructions were given.  Patient expressed his understanding of these issues. Schedule for TRUS/BX for elevated PSA  Chief Complaint:  Chief Complaint  Patient presents with   Cysto    History of Present Illness:  Roberto Charles. is a 52 y.o. male who is seen for further evaluation of gross hematuria and elevated PSA. He has a recent history of intermittent gross hematuria.  An episode occurred in November 2023.  He reports passing blood with urine at that time.  No flank pain or dysuria.  The hematuria resolved spontaneously.  Dipstick urinalysis from 09/27/2022 was negative.  Urine culture showed no growth.  He reports 2-3 episodes of gross hematuria since that time.  He previously underwent CT imaging in November 2022 which showed a few scattered stones in both kidneys without hydronephrosis, no ureteral calculi, and no suspicious renal masses.  Renal ultrasound from 08/18/2022 showed a 9 mm echogenic focus within the upper pole of the right kidney felt to represent a stone, 2.9 cm hypoechoic structure in the interpolar right kidney felt to represent a cyst, and mild prostatomegaly. He reports occasional bilateral low back pain. He has a prior history of kidney stones passing approximately 7 stones in total.  He has not required any surgical management for kidney stones.  He last passed a stone in December  2023.  He was found to have an elevated PSA in January 2024.  PSA was 8.5 with 12% free. No prior PSA results available for review.  No history of prostatitis or UTIs.  No family history of prostate cancer.  He reported a intermittent slow urine stream and nocturia 0-1 time.  He has occasional incontinence associated with coughing. IPSS = 7.  CT hematuria protocol from 10/19/2022 showed small bilateral renal calculi, no renal mass or evidence of obstruction, no ureteral calculi, enlarged prostate with heterogeneous and nodular appearance.  He presents today for further evaluation with cystoscopy. He reports passing a possible stone since his last visit.  No gross hematuria or dysuria.  No flank pain.   Portions of the above documentation were copied from a prior visit for review purposes only.   Past Medical History:  Past Medical History:  Diagnosis Date   Allergy    Anxiety    GERD (gastroesophageal reflux disease)    Headache(784.0)    History of kidney stones    Obesity    Sleep apnea    on BPAP since June    Past Surgical History:  Past Surgical History:  Procedure Laterality Date   CHOLECYSTECTOMY     FLEXIBLE SIGMOIDOSCOPY N/A 12/23/2021   Procedure: FLEXIBLE SIGMOIDOSCOPY;  Surgeon: Ileana Roup, MD;  Location: [facility];  Service: General;  Laterality: N/A;   [place]   right hand surgery      VASECTOMY  2010   XI ROBOTIC ASSISTED LOWER ANTERIOR RESECTION N/A 12/23/2021   Procedure:  XI ROBOTIC ASSISTED LOWER ANTERIOR RESECTION, BILATERAL TAP BLOCK;  Surgeon: Ileana Roup, MD;  Location: [facility];  Service: General;  Laterality: N/A;    Allergies:  Allergies  Allergen Reactions   Amoxicillin Other (See Comments)    Causes blood in intestines- Had once incidence of bloody diarrhea but thinks he has tolerated since this incidence     Family History:  Family History  Problem Relation Age of Onset   Hypertension Father    Colon polyps  Father    Uterine cancer Maternal Aunt    Testicular cancer Paternal Uncle    Uterine cancer Maternal Grandmother    Colon cancer Neg Hx    Pancreatic cancer Neg Hx    Stomach cancer Neg Hx    Liver cancer Neg Hx    Esophageal cancer Neg Hx    Rectal cancer Neg Hx     Social History:  Social History   Tobacco Use   Smoking status: Never   Smokeless tobacco: Never  Vaping Use   Vaping Use: Never used  Substance Use Topics   Alcohol use: Yes    Alcohol/week: 2.0 standard drinks of alcohol    Types: 2 Standard drinks or equivalent per week    Comment: social   Drug use: No    ROS: Constitutional:  Negative for fever, chills, weight loss CV: Negative for chest pain, previous MI, hypertension Respiratory:  Negative for shortness of breath, wheezing, sleep apnea, frequent cough GI:  Negative for nausea, vomiting, bloody stool, GERD  Physical exam: BP 113/78   Pulse 75   Ht 5' 6"$  (1.676 m)   Wt 298 lb (135.2 kg)   BMI 48.10 kg/m  GENERAL APPEARANCE:  Well appearing, well developed, well nourished, NAD HEENT:  Atraumatic, normocephalic, oropharynx clear NECK:  Supple without lymphadenopathy or thyromegaly ABDOMEN:  Soft, non-tender, no masses EXTREMITIES:  Moves all extremities well, without clubbing, cyanosis, or edema NEUROLOGIC:  Alert and oriented x 3, normal gait, CN II-XII grossly intact MENTAL STATUS:  appropriate BACK:  Non-tender to palpation, No CVAT SKIN:  Warm, dry, and intact   Results: Results for orders placed or performed in visit on 10/21/22 (from the past 24 hour(s))  Urinalysis     Status: None   Collection Time: 10/21/22 12:00 AM  Result Value Ref Range   Glucose, UA negative negative mg/dL   Bilirubin, UA negative    Ketones, UA negative    Spec Grav, UA 1.025 1.010 - 1.025   Blood, UA trace-intact    pH, UA 6.0 5.0 - 8.0   Protein, UA Negative Negative   Urobilinogen, UA 0.2 0.2 or 1.0 E.U./dL   Nitrite, UA negative    Leukocytes, UA  Negative Negative     Procedure:  Flexible Cystourethroscopy  Pre-operative Diagnosis: Gross hematuria  Post-operative Diagnosis: Gross hematuria  Anesthesia:  local with lidocaine jelly  Surgical Narrative:  After appropriate informed consent was obtained, the patient was prepped and draped in the usual sterile fashion in the supine position.  The patient was correctly identified and the proper procedure delineated prior to proceeding.  Sterile lidocaine gel was instilled in the urethra. The flexible cystoscope was introduced without difficulty.  Findings:  Anterior urethra: Normal  Posterior urethra:  lateral lobe enlargement, elevation of bladder neck  Bladder: Normal  Ureteral orifices: normal  Additional findings: none  Saline bladder wash for cytology was performed.    The cystoscope was then removed.  The patient tolerated the procedure well.

## 2022-10-21 NOTE — Addendum Note (Signed)
Addended by: Evelina Bucy on: 10/21/2022 02:27 PM   Modules accepted: Orders

## 2022-10-26 ENCOUNTER — Encounter: Payer: Self-pay | Admitting: Urology

## 2022-11-09 ENCOUNTER — Ambulatory Visit (INDEPENDENT_AMBULATORY_CARE_PROVIDER_SITE_OTHER): Payer: No Typology Code available for payment source | Admitting: Family Medicine

## 2022-11-09 ENCOUNTER — Encounter: Payer: Self-pay | Admitting: Family Medicine

## 2022-11-09 VITALS — BP 105/66 | HR 71 | Ht 66.0 in | Wt 300.0 lb

## 2022-11-09 DIAGNOSIS — R972 Elevated prostate specific antigen [PSA]: Secondary | ICD-10-CM

## 2022-11-09 DIAGNOSIS — Z114 Encounter for screening for human immunodeficiency virus [HIV]: Secondary | ICD-10-CM | POA: Diagnosis not present

## 2022-11-09 DIAGNOSIS — Z113 Encounter for screening for infections with a predominantly sexual mode of transmission: Secondary | ICD-10-CM | POA: Insufficient documentation

## 2022-11-09 DIAGNOSIS — E782 Mixed hyperlipidemia: Secondary | ICD-10-CM

## 2022-11-09 DIAGNOSIS — E1169 Type 2 diabetes mellitus with other specified complication: Secondary | ICD-10-CM | POA: Diagnosis not present

## 2022-11-09 DIAGNOSIS — Z1159 Encounter for screening for other viral diseases: Secondary | ICD-10-CM

## 2022-11-09 DIAGNOSIS — E669 Obesity, unspecified: Secondary | ICD-10-CM

## 2022-11-09 NOTE — Assessment & Plan Note (Signed)
Checking HIV, RPR and Hep c.  Declines GC/Chlamydia testing.

## 2022-11-09 NOTE — Assessment & Plan Note (Signed)
Has upcoming prostate biopsy.

## 2022-11-09 NOTE — Assessment & Plan Note (Signed)
Encouraged dietary and lifestyle change. Update A1c today.

## 2022-11-09 NOTE — Progress Notes (Signed)
Roberto Charles. - 52 y.o. male MRN [id number]  Date of birth: 12-Feb-1971  Subjective Chief Complaint  Patient presents with   Diabetes    HPI Roberto Charles. Is a 52 y.o. male here today for follow up.   Since his last visit with me has has seen urology for hematuria.  Planning on having prostate biopsy.   He is not taking anything for mangement of his diabetes at this time.  Last A1c at 5.7%.  He has worked on dietary changes.  Still planning on getting back on track with exercise but hasn't so far.   Reports that he and his wife separated a little over a year ago.  He has had a couple of new partners since that time.  Other than the prostate issues he denies any other symptoms.  Would like HIV, Hep C and syphilis testing.   ROS:  A comprehensive ROS was completed and negative except as noted per HPI    Allergies  Allergen Reactions   Amoxicillin Other (See Comments)    Causes blood in intestines- Had once incidence of bloody diarrhea but thinks he has tolerated since this incidence     Past Medical History:  Diagnosis Date   Allergy    Anxiety    GERD (gastroesophageal reflux disease)    Headache(784.0)    History of kidney stones    Obesity    Sleep apnea    on BPAP since June    Past Surgical History:  Procedure Laterality Date   CHOLECYSTECTOMY     FLEXIBLE SIGMOIDOSCOPY N/A 12/23/2021   Procedure: FLEXIBLE SIGMOIDOSCOPY;  Surgeon: Ileana Roup, MD;  Location: [facility];  Service: General;  Laterality: N/A;   [place]   right hand surgery      VASECTOMY  2010   XI ROBOTIC ASSISTED LOWER ANTERIOR RESECTION N/A 12/23/2021   Procedure: XI ROBOTIC ASSISTED LOWER ANTERIOR RESECTION, BILATERAL TAP BLOCK;  Surgeon: Ileana Roup, MD;  Location: [facility];  Service: General;  Laterality: N/A;    Social History   Socioeconomic History   Marital status: Legally Separated    Spouse name: Not on file   Number of children: 2   Years of  education: Not on file   Highest education level: Not on file  Occupational History   Occupation: Insurance  Tobacco Use   Smoking status: Never   Smokeless tobacco: Never  Vaping Use   Vaping Use: Never used  Substance and Sexual Activity   Alcohol use: Yes    Alcohol/week: 2.0 standard drinks of alcohol    Types: 2 Standard drinks or equivalent per week    Comment: social   Drug use: No   Sexual activity: Yes    Partners: Female    Birth control/protection: Other-see comments    Comment: Vasectomy  Other Topics Concern   Not on file  Social History Narrative   Not on file   Social Determinants of Health   Financial Resource Strain: Not on file  Food Insecurity: Not on file  Transportation Needs: Not on file  Physical Activity: Not on file  Stress: Not on file  Social Connections: Not on file    Family History  Problem Relation Age of Onset   Hypertension Father    Colon polyps Father    Uterine cancer Maternal Aunt    Testicular cancer Paternal Uncle    Uterine cancer Maternal Grandmother    Colon cancer Neg Hx  Pancreatic cancer Neg Hx    Stomach cancer Neg Hx    Liver cancer Neg Hx    Esophageal cancer Neg Hx    Rectal cancer Neg Hx     Health Maintenance  Topic Date Due   HEMOGLOBIN A1C  11/09/2022   Zoster Vaccines- Shingrix (2 of 2) 12/27/2022 (Originally 07/06/2022)   OPHTHALMOLOGY EXAM  03/06/2023 (Originally 01/26/2022)   Hepatitis C Screening  05/12/2023 (Originally 07/15/1989)   COVID-19 Vaccine (4 - 2023-24 season) 10/14/2023 (Originally 05/06/2022)   Diabetic kidney evaluation - eGFR measurement  12/27/2022   Diabetic kidney evaluation - Urine ACR  05/12/2023   FOOT EXAM  11/09/2023   DTaP/Tdap/Td (3 - Td or Tdap) 01/15/2028   COLONOSCOPY (Pts 45-48yr Insurance coverage will need to be confirmed)  05/22/2031   INFLUENZA VACCINE  Completed   HIV Screening  Completed   HPV VACCINES  Aged Out      ----------------------------------------------------------------------------------------------------------------------------------------------------------------------------------------------------------------- Physical Exam BP 105/66 (BP Location: Left Arm, Patient Position: Sitting, Cuff Size: Large)   Pulse 71   Ht '5\' 6"'$  (1.676 m)   Wt 300 lb (136.1 kg)   SpO2 96%   BMI 48.42 kg/m   Physical Exam Constitutional:      Appearance: Normal appearance.  HENT:     Head: Normocephalic and atraumatic.  Eyes:     General: No scleral icterus. Cardiovascular:     Rate and Rhythm: Normal rate and regular rhythm.  Pulmonary:     Effort: Pulmonary effort is normal.     Breath sounds: Normal breath sounds.  Musculoskeletal:     Cervical back: Neck supple.  Neurological:     Mental Status: He is alert.  Psychiatric:        Mood and Affect: Mood normal.        Behavior: Behavior normal.     ------------------------------------------------------------------------------------------------------------------------------------------------------------------------------------------------------------------- Assessment and Plan  Diabetes mellitus type 2 in obese (HCC) Encouraged dietary and lifestyle change. Update A1c today.   Elevated PSA Has upcoming prostate biopsy.   Mixed hyperlipidemia Update lipid panel.   Routine screening for STI (sexually transmitted infection) Checking HIV, RPR and Hep c.  Declines GC/Chlamydia testing.    No orders of the defined types were placed in this encounter.   No follow-ups on file.    This visit occurred during the SARS-CoV-2 public health emergency.  Safety protocols were in place, including screening questions prior to the visit, additional usage of staff PPE, and extensive cleaning of exam room while observing appropriate contact time as indicated for disinfecting solutions.

## 2022-11-09 NOTE — Assessment & Plan Note (Signed)
Update lipid panel.

## 2022-11-10 LAB — CBC WITH DIFFERENTIAL/PLATELET
Absolute Monocytes: 490 cells/uL (ref 200–950)
Basophils Absolute: 22 cells/uL (ref 0–200)
Basophils Relative: 0.3 %
Eosinophils Absolute: 122 cells/uL (ref 15–500)
Eosinophils Relative: 1.7 %
HCT: 42.6 % (ref 38.5–50.0)
Hemoglobin: 14.4 g/dL (ref 13.2–17.1)
Lymphs Abs: 1735 cells/uL (ref 850–3900)
MCH: 27.6 pg (ref 27.0–33.0)
MCHC: 33.8 g/dL (ref 32.0–36.0)
MCV: 81.6 fL (ref 80.0–100.0)
MPV: 10.3 fL (ref 7.5–12.5)
Monocytes Relative: 6.8 %
Neutro Abs: 4831 cells/uL (ref 1500–7800)
Neutrophils Relative %: 67.1 %
Platelets: 196 10*3/uL (ref 140–400)
RBC: 5.22 10*6/uL (ref 4.20–5.80)
RDW: 14.8 % (ref 11.0–15.0)
Total Lymphocyte: 24.1 %
WBC: 7.2 10*3/uL (ref 3.8–10.8)

## 2022-11-10 LAB — HEMOGLOBIN A1C
Hgb A1c MFr Bld: 7.1 % of total Hgb — ABNORMAL HIGH (ref <5.7)
Mean Plasma Glucose: 157 mg/dL
eAG (mmol/L): 8.7 mmol/L

## 2022-11-10 LAB — COMPLETE METABOLIC PANEL WITH GFR
AG Ratio: 1.5 (calc) (ref 1.0–2.5)
ALT: 38 U/L (ref 9–46)
AST: 20 U/L (ref 10–35)
Albumin: 4.3 g/dL (ref 3.6–5.1)
Alkaline phosphatase (APISO): 103 U/L (ref 35–144)
BUN/Creatinine Ratio: 21 (calc) (ref 6–22)
BUN: 14 mg/dL (ref 7–25)
CO2: 26 mmol/L (ref 20–32)
Calcium: 8.9 mg/dL (ref 8.6–10.3)
Chloride: 104 mmol/L (ref 98–110)
Creat: 0.66 mg/dL — ABNORMAL LOW (ref 0.70–1.30)
Globulin: 2.8 g/dL (calc) (ref 1.9–3.7)
Glucose, Bld: 112 mg/dL — ABNORMAL HIGH (ref 65–99)
Potassium: 4.1 mmol/L (ref 3.5–5.3)
Sodium: 140 mmol/L (ref 135–146)
Total Bilirubin: 0.6 mg/dL (ref 0.2–1.2)
Total Protein: 7.1 g/dL (ref 6.1–8.1)
eGFR: 114 mL/min/{1.73_m2} (ref >=60)

## 2022-11-10 LAB — LIPID PANEL W/REFLEX DIRECT LDL
Cholesterol: 191 mg/dL (ref <200)
HDL: 48 mg/dL (ref >=40)
LDL Cholesterol (Calc): 111 mg/dL (calc) — ABNORMAL HIGH
Non-HDL Cholesterol (Calc): 143 mg/dL (calc) — ABNORMAL HIGH (ref <130)
Total CHOL/HDL Ratio: 4 (calc) (ref <5.0)
Triglycerides: 206 mg/dL — ABNORMAL HIGH (ref <150)

## 2022-11-10 LAB — HEPATITIS C ANTIBODY: Hepatitis C Ab: NONREACTIVE

## 2022-11-10 LAB — HIV ANTIBODY (ROUTINE TESTING W REFLEX): HIV 1&2 Ab, 4th Generation: NONREACTIVE

## 2022-11-10 LAB — RPR: RPR Ser Ql: NONREACTIVE

## 2022-11-16 ENCOUNTER — Ambulatory Visit (HOSPITAL_BASED_OUTPATIENT_CLINIC_OR_DEPARTMENT_OTHER)
Admission: RE | Admit: 2022-11-16 | Discharge: 2022-11-16 | Disposition: A | Discharge status: Home / Self Care | Payer: No Typology Code available for payment source | Source: Ambulatory Visit | Attending: Urology | Admitting: Urology

## 2022-11-16 ENCOUNTER — Ambulatory Visit (INDEPENDENT_AMBULATORY_CARE_PROVIDER_SITE_OTHER): Payer: No Typology Code available for payment source | Admitting: Urology

## 2022-11-16 ENCOUNTER — Encounter: Payer: Self-pay | Admitting: Urology

## 2022-11-16 ENCOUNTER — Other Ambulatory Visit: Payer: Self-pay | Admitting: Urology

## 2022-11-16 VITALS — BP 114/74 | HR 67 | Ht 66.0 in | Wt 300.0 lb

## 2022-11-16 DIAGNOSIS — R972 Elevated prostate specific antigen [PSA]: Secondary | ICD-10-CM | POA: Insufficient documentation

## 2022-11-16 DIAGNOSIS — R319 Hematuria, unspecified: Secondary | ICD-10-CM | POA: Insufficient documentation

## 2022-11-16 DIAGNOSIS — R310 Gross hematuria: Secondary | ICD-10-CM

## 2022-11-16 DIAGNOSIS — N200 Calculus of kidney: Secondary | ICD-10-CM

## 2022-11-16 DIAGNOSIS — Z2989 Encounter for other specified prophylactic measures: Secondary | ICD-10-CM

## 2022-11-16 LAB — URINALYSIS, ROUTINE W REFLEX MICROSCOPIC
Bilirubin, UA: NEGATIVE
Blood, UA: NEGATIVE
Glucose, UA: NEGATIVE mg/dL
Ketones, UA: NEGATIVE
Leukocytes, UA: NEGATIVE
Nitrite, UA: NEGATIVE
Protein, UA: NEGATIVE
Spec Grav, UA: >=1.03 — AB (ref 1.010–1.025)
Urobilinogen, UA: 0.2 E.U./dL
pH, UA: 6 (ref 5.0–8.0)

## 2022-11-16 MED ORDER — CEFTRIAXONE SODIUM 1 G IJ SOLR
1.0000 g | Freq: Once | INTRAMUSCULAR | Status: AC
  Administered 2022-11-16: 1 g via INTRAMUSCULAR

## 2022-11-16 NOTE — Progress Notes (Signed)
IM Injection  Patient is present today for an IM Injection for treatment of infection prevention post prostate biopsy. Drug: Ceftriaxone Dose:1g Location:Right upper outer buttocks Lot: [id number] Exp:07/05/2024 Patient tolerated well, no complications were noted  Performed by: Bradly Bienenstock CMA

## 2022-11-16 NOTE — Progress Notes (Signed)
Assessment: 1. Elevated PSA   2. Gross hematuria: negative evaluation 2/24   3. Nephrolithiasis     Plan: Post biopsy instructions given Return to office in 7-10 days for biopsy results  Chief Complaint:  Chief Complaint  Patient presents with   Prostate Biopsy    History of Present Illness:  Roberto Charles. is a 52 y.o. male who is seen for further evaluation of elevated PSA.  He has a recent history of intermittent gross hematuria.  An episode occurred in November 2023.  He reports passing blood with urine at that time.  No flank pain or dysuria.  The hematuria resolved spontaneously.  Dipstick urinalysis from 09/27/2022 was negative.  Urine culture showed no growth.  He reports 2-3 episodes of gross hematuria since that time.  He previously underwent CT imaging in November 2022 which showed a few scattered stones in both kidneys without hydronephrosis, no ureteral calculi, and no suspicious renal masses.  Renal ultrasound from 08/18/2022 showed a 9 mm echogenic focus within the upper pole of the right kidney felt to represent a stone, 2.9 cm hypoechoic structure in the interpolar right kidney felt to represent a cyst, and mild prostatomegaly. He reports occasional bilateral low back pain. He has a prior history of kidney stones passing approximately 7 stones in total.  He has not required any surgical management for kidney stones.  He last passed a stone in December 2023.  He was found to have an elevated PSA in January 2024.  PSA was 8.5 with 12% free. No prior PSA results available for review.  No history of prostatitis or UTIs.  No family history of prostate cancer.  He reported a intermittent slow urine stream and nocturia 0-1 time.  He has occasional incontinence associated with coughing. IPSS = 7.  CT hematuria protocol from 10/19/2022 showed small bilateral renal calculi, no renal mass or evidence of obstruction, no ureteral calculi, enlarged prostate with heterogeneous  and nodular appearance. Cystoscopy from 10/21/22 showed lateral lobe enlargement of the prostate, elevated bladder neck, and no mucosal lesions in bladder.  Urine cytology was negative for malignancy.  He presents today for prostate biopsy.    Portions of the above documentation were copied from a prior visit for review purposes only.   Past Medical History:  Past Medical History:  Diagnosis Date   Allergy    Anxiety    GERD (gastroesophageal reflux disease)    Headache(784.0)    History of kidney stones    Obesity    Sleep apnea    on BPAP since June    Past Surgical History:  Past Surgical History:  Procedure Laterality Date   CHOLECYSTECTOMY     FLEXIBLE SIGMOIDOSCOPY N/A 12/23/2021   Procedure: FLEXIBLE SIGMOIDOSCOPY;  Surgeon: Ileana Roup, MD;  Location: [facility];  Service: General;  Laterality: N/A;   [place]   right hand surgery      VASECTOMY  2010   XI ROBOTIC ASSISTED LOWER ANTERIOR RESECTION N/A 12/23/2021   Procedure: XI ROBOTIC ASSISTED LOWER ANTERIOR RESECTION, BILATERAL TAP BLOCK;  Surgeon: Ileana Roup, MD;  Location: [facility];  Service: General;  Laterality: N/A;    Allergies:  Allergies  Allergen Reactions   Amoxicillin Other (See Comments)    Causes blood in intestines- Had once incidence of bloody diarrhea but thinks he has tolerated since this incidence     Family History:  Family History  Problem Relation Age of Onset   Hypertension Father  Colon polyps Father    Uterine cancer Maternal Aunt    Testicular cancer Paternal Uncle    Uterine cancer Maternal Grandmother    Colon cancer Neg Hx    Pancreatic cancer Neg Hx    Stomach cancer Neg Hx    Liver cancer Neg Hx    Esophageal cancer Neg Hx    Rectal cancer Neg Hx     Social History:  Social History   Tobacco Use   Smoking status: Never   Smokeless tobacco: Never  Vaping Use   Vaping Use: Never used  Substance Use Topics   Alcohol use: Yes     Alcohol/week: 2.0 standard drinks of alcohol    Types: 2 Standard drinks or equivalent per week    Comment: social   Drug use: No    ROS: Constitutional:  Negative for fever, chills, weight loss CV: Negative for chest pain, previous MI, hypertension Respiratory:  Negative for shortness of breath, wheezing, sleep apnea, frequent cough GI:  Negative for nausea, vomiting, bloody stool, GERD  Physical exam: BP 114/74   Pulse 67   Ht '5\' 6"'$  (1.676 m)   Wt 300 lb (136.1 kg)   BMI 48.42 kg/m  GENERAL APPEARANCE:  Well appearing, well developed, well nourished, NAD HEENT:  Atraumatic, normocephalic, oropharynx clear NECK:  Supple without lymphadenopathy or thyromegaly ABDOMEN:  Soft, non-tender, no masses EXTREMITIES:  Moves all extremities well, without clubbing, cyanosis, or edema NEUROLOGIC:  Alert and oriented x 3, normal gait, CN II-XII grossly intact MENTAL STATUS:  appropriate BACK:  Non-tender to palpation, No CVAT SKIN:  Warm, dry, and intact   Results: U/A dipstick: negative  TRANSRECTAL ULTRASOUND AND PROSTATE BIOPSY  Indication:  Elevated PSA  Prophylactic antibiotic administration: Rocephin  All medications that could result in increased bleeding were discontinued within an appropriate period of the time of biopsy.  Risk including bleeding and infection were discussed.  Informed consent was obtained.  The patient was placed in the left lateral decubitus position.  PROCEDURE 1.  TRANSRECTAL ULTRASOUND OF THE PROSTATE  The 7 MHz transrectal probe was used to image the prostate.  Anal stenosis was not noted.  TRUS volume: 80.2 ml  Hypoechoic areas: None  Hyperechoic areas: None  Central calcifications: not present  Margins:  normal  Seminal Vesicles: normal   PROCEDURE 2:  PROSTATE BIOPSY  A periprostatic block was performed using 1% lidocaine and transrectal ultrasound guidance. Under transrectal ultrasound guidance, and using the Biopty gun, prostate  biopsies were obtained systematically from the apex, mid gland, and base bilaterally.  A total of 12 cores were obtained.  Hemostasis was obtained with gentle pressure on the prostate.  The procedures were well-tolerated.  No significant bleeding was noted at the end of the procedure.  The patient was stable for discharge from the office.

## 2022-11-18 ENCOUNTER — Encounter: Payer: Self-pay | Admitting: Urology

## 2022-11-18 LAB — URINALYSIS, ROUTINE W REFLEX MICROSCOPIC

## 2022-11-21 ENCOUNTER — Encounter: Payer: Self-pay | Admitting: Urology

## 2022-11-24 ENCOUNTER — Encounter: Payer: Self-pay | Admitting: Urology

## 2022-11-24 ENCOUNTER — Ambulatory Visit: Payer: No Typology Code available for payment source | Admitting: Urology

## 2022-11-24 VITALS — BP 132/79 | HR 78 | Wt 300.0 lb

## 2022-11-24 DIAGNOSIS — R972 Elevated prostate specific antigen [PSA]: Secondary | ICD-10-CM

## 2022-11-24 DIAGNOSIS — Z87442 Personal history of urinary calculi: Secondary | ICD-10-CM

## 2022-11-24 DIAGNOSIS — R310 Gross hematuria: Secondary | ICD-10-CM

## 2022-11-24 DIAGNOSIS — N400 Enlarged prostate without lower urinary tract symptoms: Secondary | ICD-10-CM

## 2022-11-24 DIAGNOSIS — N200 Calculus of kidney: Secondary | ICD-10-CM

## 2022-11-24 NOTE — Progress Notes (Signed)
Assessment: 1. Elevated PSA; negative biopsy 3/24   2. Gross hematuria: negative evaluation 2/24   3. Nephrolithiasis   4. BPH without urinary obstruction     Plan: Biopsy results discussed with the patient today.  No evidence of prostate cancer on the biopsy samples. I discussed the possible association of his BPH and/or prostatitis with his elevated PSA. Recommendations made for continued monitoring of his PSA with repeat PSA testing as well as other modalities such as ExoDx and MRI. Return to office in 3-4 months  Chief Complaint:  Chief Complaint  Patient presents with   Results    History of Present Illness:  Roberto Rozell. is a 52 y.o. male who is seen for discussion of recent prostate biopsy results. He has a recent history of elevated PSA and gross hematuria.  He has a recent history of intermittent gross hematuria.  An episode occurred in November 2023.  He reports passing blood with urine at that time.  No flank pain or dysuria.  The hematuria resolved spontaneously.  Dipstick urinalysis from 09/27/2022 was negative.  Urine culture showed no growth.  He reports 2-3 episodes of gross hematuria since that time.  He previously underwent CT imaging in November 2022 which showed a few scattered stones in both kidneys without hydronephrosis, no ureteral calculi, and no suspicious renal masses.  Renal ultrasound from 08/18/2022 showed a 9 mm echogenic focus within the upper pole of the right kidney felt to represent a stone, 2.9 cm hypoechoic structure in the interpolar right kidney felt to represent a cyst, and mild prostatomegaly. He reports occasional bilateral low back pain. He has a prior history of kidney stones passing approximately 7 stones in total.  He has not required any surgical management for kidney stones.  He last passed a stone in December 2023.  He was found to have an elevated PSA in January 2024.  PSA was 8.5 with 12% free. No prior PSA results available  for review.  No history of prostatitis or UTIs.  No family history of prostate cancer.  He reported a intermittent slow urine stream and nocturia 0-1 time.  He has occasional incontinence associated with coughing. IPSS = 7.  CT hematuria protocol from 10/19/2022 showed small bilateral renal calculi, no renal mass or evidence of obstruction, no ureteral calculi, enlarged prostate with heterogeneous and nodular appearance. Cystoscopy from 10/21/22 showed lateral lobe enlargement of the prostate, elevated bladder neck, and no mucosal lesions in bladder.  Urine cytology was negative for malignancy.  He returns today following his transrectal ultrasound and biopsy of the prostate on 11/16/22. PSA: 8.5 ng/ml TRUS volume:  80.2 ml  PSA density:  0.11 Biopsy results:  benign prostate tissue with one area of focal chronic prostatitis           Complications after biopsy: none    Portions of the above documentation were copied from a prior visit for review purposes only.   Past Medical History:  Past Medical History:  Diagnosis Date   Allergy    Anxiety    GERD (gastroesophageal reflux disease)    Headache(784.0)    History of kidney stones    Obesity    Sleep apnea    on BPAP since June    Past Surgical History:  Past Surgical History:  Procedure Laterality Date   CHOLECYSTECTOMY     FLEXIBLE SIGMOIDOSCOPY N/A 12/23/2021   Procedure: FLEXIBLE SIGMOIDOSCOPY;  Surgeon: Ileana Roup, MD;  Location: [facility];  Service: General;  Laterality: N/A;   [place]   right hand surgery      VASECTOMY  2010   XI ROBOTIC ASSISTED LOWER ANTERIOR RESECTION N/A 12/23/2021   Procedure: XI ROBOTIC ASSISTED LOWER ANTERIOR RESECTION, BILATERAL TAP BLOCK;  Surgeon: Ileana Roup, MD;  Location: [facility];  Service: General;  Laterality: N/A;    Allergies:  Allergies  Allergen Reactions   Amoxicillin Other (See Comments)    Causes blood in intestines- Had once incidence of bloody  diarrhea but thinks he has tolerated since this incidence     Family History:  Family History  Problem Relation Age of Onset   Hypertension Father    Colon polyps Father    Uterine cancer Maternal Aunt    Testicular cancer Paternal Uncle    Uterine cancer Maternal Grandmother    Colon cancer Neg Hx    Pancreatic cancer Neg Hx    Stomach cancer Neg Hx    Liver cancer Neg Hx    Esophageal cancer Neg Hx    Rectal cancer Neg Hx     Social History:  Social History   Tobacco Use   Smoking status: Never   Smokeless tobacco: Never  Vaping Use   Vaping Use: Never used  Substance Use Topics   Alcohol use: Yes    Alcohol/week: 2.0 standard drinks of alcohol    Types: 2 Standard drinks or equivalent per week    Comment: social   Drug use: No    ROS: Constitutional:  Negative for fever, chills, weight loss CV: Negative for chest pain, previous MI, hypertension Respiratory:  Negative for shortness of breath, wheezing, sleep apnea, frequent cough GI:  Negative for nausea, vomiting, bloody stool, GERD  Physical exam: BP 132/79   Pulse 78   Wt 300 lb (136.1 kg)   BMI 48.42 kg/m  GENERAL APPEARANCE:  Well appearing, well developed, well nourished, NAD HEENT:  Atraumatic, normocephalic, oropharynx clear NECK:  Supple without lymphadenopathy or thyromegaly ABDOMEN:  Soft, non-tender, no masses EXTREMITIES:  Moves all extremities well, without clubbing, cyanosis, or edema NEUROLOGIC:  Alert and oriented x 3, normal gait, CN II-XII grossly intact MENTAL STATUS:  appropriate BACK:  Non-tender to palpation, No CVAT SKIN:  Warm, dry, and intact  Results: None

## 2022-12-06 ENCOUNTER — Encounter: Payer: Self-pay | Admitting: Family Medicine

## 2022-12-07 ENCOUNTER — Other Ambulatory Visit: Payer: Self-pay | Admitting: Family Medicine

## 2022-12-07 DIAGNOSIS — E1165 Type 2 diabetes mellitus with hyperglycemia: Secondary | ICD-10-CM

## 2022-12-07 MED ORDER — TIRZEPATIDE 2.5 MG/0.5ML ~~LOC~~ SOAJ: 2.5000 mg | SUBCUTANEOUS | 0 refills | Status: DC

## 2022-12-07 NOTE — Progress Notes (Signed)
Pt stated he is ready to start mounjaro. Sent in prescription of beginning dose.

## 2023-01-04 ENCOUNTER — Other Ambulatory Visit: Payer: Self-pay | Admitting: Family Medicine

## 2023-01-04 DIAGNOSIS — E1165 Type 2 diabetes mellitus with hyperglycemia: Secondary | ICD-10-CM

## 2023-01-13 ENCOUNTER — Encounter: Payer: Self-pay | Admitting: Family Medicine

## 2023-02-24 ENCOUNTER — Encounter: Payer: Self-pay | Admitting: Urology

## 2023-02-24 ENCOUNTER — Ambulatory Visit (INDEPENDENT_AMBULATORY_CARE_PROVIDER_SITE_OTHER): Payer: No Typology Code available for payment source | Admitting: Urology

## 2023-02-24 VITALS — BP 116/76 | HR 85 | Ht 67.0 in | Wt 298.0 lb

## 2023-02-24 DIAGNOSIS — N200 Calculus of kidney: Secondary | ICD-10-CM

## 2023-02-24 DIAGNOSIS — R972 Elevated prostate specific antigen [PSA]: Secondary | ICD-10-CM | POA: Diagnosis not present

## 2023-02-24 DIAGNOSIS — N400 Enlarged prostate without lower urinary tract symptoms: Secondary | ICD-10-CM

## 2023-02-24 DIAGNOSIS — R310 Gross hematuria: Secondary | ICD-10-CM

## 2023-02-24 LAB — URINALYSIS, ROUTINE W REFLEX MICROSCOPIC
Bilirubin, UA: NEGATIVE
Glucose, UA: NEGATIVE
Ketones, UA: NEGATIVE
Leukocytes,UA: NEGATIVE
Nitrite, UA: NEGATIVE
Protein,UA: NEGATIVE
RBC, UA: NEGATIVE
Specific Gravity, UA: 1.03 (ref 1.005–1.030)
Urobilinogen, Ur: 0.2 mg/dL (ref 0.2–1.0)
pH, UA: 5.5 (ref 5.0–7.5)

## 2023-02-24 NOTE — Progress Notes (Signed)
Assessment: 1. Elevated PSA; negative biopsy 3/24   2. Gross hematuria: negative evaluation 2/24   3. Nephrolithiasis   4. BPH without urinary obstruction     Plan: Free and total PSA today I again discussed recommendations for continued monitoring of his PSA as well as other modalities such as urine based testing (ExoDx and MyProstateScore) and MRI in patients with elevated PSA and prior negative biopsy. Return to office in 6 months  Chief Complaint:  Chief Complaint  Patient presents with   Elevated PSA    History of Present Illness:  Roberto Charles. is a 52 y.o. male who is seen for discussion of recent prostate biopsy results. He has a recent history of elevated PSA and gross hematuria.  He has a recent history of intermittent gross hematuria.  An episode occurred in November 2023.  He reports passing blood with urine at that time.  No flank pain or dysuria.  The hematuria resolved spontaneously.  Dipstick urinalysis from 09/27/2022 was negative.  Urine culture showed no growth.  He reports 2-3 episodes of gross hematuria since that time.  He previously underwent CT imaging in November 2022 which showed a few scattered stones in both kidneys without hydronephrosis, no ureteral calculi, and no suspicious renal masses.  Renal ultrasound from 08/18/2022 showed a 9 mm echogenic focus within the upper pole of the right kidney felt to represent a stone, 2.9 cm hypoechoic structure in the interpolar right kidney felt to represent a cyst, and mild prostatomegaly. He reports occasional bilateral low back pain. He has a prior history of kidney stones passing approximately 7 stones in total.  He has not required any surgical management for kidney stones.  He last passed a stone in December 2023.  He was found to have an elevated PSA in January 2024.  PSA was 8.5 with 12% free. No prior PSA results available for review.  No history of prostatitis or UTIs.  No family history of prostate  cancer.  He reported a intermittent slow urine stream and nocturia 0-1 time.  He has occasional incontinence associated with coughing. IPSS = 7.  CT hematuria protocol from 10/19/2022 showed small bilateral renal calculi, no renal mass or evidence of obstruction, no ureteral calculi, enlarged prostate with heterogeneous and nodular appearance. Cystoscopy from 10/21/22 showed lateral lobe enlargement of the prostate, elevated bladder neck, and no mucosal lesions in bladder.  Urine cytology was negative for malignancy.  He underwent transrectal ultrasound and biopsy of the prostate on 11/16/22. PSA: 8.5 ng/ml TRUS volume:  80.2 ml  PSA density:  0.11 Biopsy results:  benign prostate tissue with one area of focal chronic prostatitis           Complications after biopsy: none  He returns today for scheduled follow-up.  No new urinary symptoms.  No dysuria or gross hematuria. IPSS = 4 today.   Portions of the above documentation were copied from a prior visit for review purposes only.   Past Medical History:  Past Medical History:  Diagnosis Date   Allergy    Anxiety    GERD (gastroesophageal reflux disease)    Headache(784.0)    History of kidney stones    Obesity    Sleep apnea    on BPAP since June    Past Surgical History:  Past Surgical History:  Procedure Laterality Date   CHOLECYSTECTOMY     FLEXIBLE SIGMOIDOSCOPY N/A 12/23/2021   Procedure: FLEXIBLE SIGMOIDOSCOPY;  Surgeon: Andria Meuse, MD;  Location:  [facility];  Service: General;  Laterality: N/A;   MANDIBLE SURGERY  1995   right hand surgery      VASECTOMY  2010   XI ROBOTIC ASSISTED LOWER ANTERIOR RESECTION N/A 12/23/2021   Procedure: XI ROBOTIC ASSISTED LOWER ANTERIOR RESECTION, BILATERAL TAP BLOCK;  Surgeon: Andria Meuse, MD;  Location: [facility];  Service: General;  Laterality: N/A;    Allergies:  Allergies  Allergen Reactions   Amoxicillin Other (See Comments)    Causes blood in intestines- Had once  incidence of bloody diarrhea but thinks he has tolerated since this incidence     Family History:  Family History  Problem Relation Age of Onset   Hypertension Father    Colon polyps Father    Uterine cancer Maternal Aunt    Testicular cancer Paternal Uncle    Uterine cancer Maternal Grandmother    Colon cancer Neg Hx    Pancreatic cancer Neg Hx    Stomach cancer Neg Hx    Liver cancer Neg Hx    Esophageal cancer Neg Hx    Rectal cancer Neg Hx     Social History:  Social History   Tobacco Use   Smoking status: Never   Smokeless tobacco: Never  Vaping Use   Vaping Use: Never used  Substance Use Topics   Alcohol use: Yes    Alcohol/week: 2.0 standard drinks of alcohol    Types: 2 Standard drinks or equivalent per week    Comment: social   Drug use: No    ROS: Constitutional:  Negative for fever, chills, weight loss CV: Negative for chest pain, previous MI, hypertension Respiratory:  Negative for shortness of breath, wheezing, sleep apnea, frequent cough GI:  Negative for nausea, vomiting, bloody stool, GERD  Physical exam: BP 116/76   Pulse 85   Ht 5\' 7"  (1.702 m)   Wt 298 lb (135.2 kg)   BMI 46.67 kg/m  GENERAL APPEARANCE:  Well appearing, well developed, well nourished, NAD HEENT:  Atraumatic, normocephalic, oropharynx clear NECK:  Supple without lymphadenopathy or thyromegaly ABDOMEN:  Soft, non-tender, no masses EXTREMITIES:  Moves all extremities well, without clubbing, cyanosis, or edema NEUROLOGIC:  Alert and oriented x 3, normal gait, CN II-XII grossly intact MENTAL STATUS:  appropriate BACK:  Non-tender to palpation, No CVAT SKIN:  Warm, dry, and intact  Results: U/A: negative

## 2023-02-25 LAB — PSA, TOTAL AND FREE
PSA, Free Pct: 32.5 %
PSA, Free: 0.26 ng/mL
Prostate Specific Ag, Serum: 0.8 ng/mL (ref 0.0–4.0)

## 2023-02-27 ENCOUNTER — Encounter: Payer: Self-pay | Admitting: Urology

## 2023-05-12 ENCOUNTER — Ambulatory Visit: Payer: No Typology Code available for payment source | Admitting: Family Medicine

## 2023-05-14 ENCOUNTER — Other Ambulatory Visit: Payer: Self-pay | Admitting: Family Medicine

## 2023-05-22 ENCOUNTER — Ambulatory Visit (INDEPENDENT_AMBULATORY_CARE_PROVIDER_SITE_OTHER): Payer: No Typology Code available for payment source | Admitting: Family Medicine

## 2023-05-22 ENCOUNTER — Ambulatory Visit: Payer: No Typology Code available for payment source

## 2023-05-22 ENCOUNTER — Encounter: Payer: Self-pay | Admitting: Family Medicine

## 2023-05-22 VITALS — BP 135/79 | HR 65 | Ht 67.0 in | Wt 298.0 lb

## 2023-05-22 DIAGNOSIS — Z7984 Long term (current) use of oral hypoglycemic drugs: Secondary | ICD-10-CM

## 2023-05-22 DIAGNOSIS — E669 Obesity, unspecified: Secondary | ICD-10-CM | POA: Diagnosis not present

## 2023-05-22 DIAGNOSIS — E1169 Type 2 diabetes mellitus with other specified complication: Secondary | ICD-10-CM

## 2023-05-22 DIAGNOSIS — M25562 Pain in left knee: Secondary | ICD-10-CM

## 2023-05-22 DIAGNOSIS — E1165 Type 2 diabetes mellitus with hyperglycemia: Secondary | ICD-10-CM | POA: Diagnosis not present

## 2023-05-22 DIAGNOSIS — Z23 Encounter for immunization: Secondary | ICD-10-CM | POA: Diagnosis not present

## 2023-05-22 LAB — POCT GLYCOSYLATED HEMOGLOBIN (HGB A1C): HbA1c, POC (controlled diabetic range): 7.4 % — AB (ref 0.0–7.0)

## 2023-05-22 MED ORDER — MELOXICAM 15 MG PO TABS: 15.0000 mg | ORAL_TABLET | Freq: Every day | ORAL | 0 refills | Status: DC

## 2023-05-22 NOTE — Progress Notes (Signed)
Roberto Charles. - 52 y.o. male MRN [id number]  Date of birth: 01-08-1971  Subjective Chief Complaint  Patient presents with   Diabetes    HPI Roberto Charles. Is a 52 y.o. male here today for follow-up visit.  He was unable to tolerate Mounjaro due to GI upset.  He has not taken metformin.  A1c is increased since last visit.  Has not really made any significant changes to his diet or activity level.  Having pain in the left knee.  Pain is in the anterior knee.  Pain comes and goes.  No swelling.  Does feel like the knee is going to give out at times.  Has not tried anything to help with management of symptoms.  ROS:  A comprehensive ROS was completed and negative except as noted per HPI  Allergies  Allergen Reactions   Amoxicillin Other (See Comments)    Causes blood in intestines- Had once incidence of bloody diarrhea but thinks he has tolerated since this incidence     Past Medical History:  Diagnosis Date   Allergy    Anxiety    GERD (gastroesophageal reflux disease)    Headache(784.0)    History of kidney stones    Obesity    Sleep apnea    on BPAP since June    Past Surgical History:  Procedure Laterality Date   CHOLECYSTECTOMY     FLEXIBLE SIGMOIDOSCOPY N/A 12/23/2021   Procedure: FLEXIBLE SIGMOIDOSCOPY;  Surgeon: Andria Meuse, MD;  Location: [facility];  Service: General;  Laterality: N/A;   MANDIBLE SURGERY  1995   right hand surgery      VASECTOMY  2010   XI ROBOTIC ASSISTED LOWER ANTERIOR RESECTION N/A 12/23/2021   Procedure: XI ROBOTIC ASSISTED LOWER ANTERIOR RESECTION, BILATERAL TAP BLOCK;  Surgeon: Andria Meuse, MD;  Location: [facility];  Service: General;  Laterality: N/A;    Social History   Socioeconomic History   Marital status: Divorced    Spouse name: Not on file   Number of children: 2   Years of education: Not on file   Highest education level: Bachelor's degree (e.g., BA, AB, BS)  Occupational History   Occupation:  Insurance  Tobacco Use   Smoking status: Never   Smokeless tobacco: Never  Vaping Use   Vaping status: Never Used  Substance and Sexual Activity   Alcohol use: Yes    Alcohol/week: 2.0 standard drinks of alcohol    Types: 2 Standard drinks or equivalent per week    Comment: social   Drug use: No   Sexual activity: Yes    Partners: Female    Birth control/protection: Other-see comments    Comment: Vasectomy  Other Topics Concern   Not on file  Social History Narrative   Not on file   Social Determinants of Health   Financial Resource Strain: Low Risk  (05/21/2023)   Overall Financial Resource Strain (CARDIA)    Difficulty of Paying Living Expenses: Not very hard  Food Insecurity: No Food Insecurity (05/21/2023)   Hunger Vital Sign    Worried About Running Out of Food in the Last Year: Never true    Ran Out of Food in the Last Year: Never true  Transportation Needs: No Transportation Needs (05/21/2023)   PRAPARE - Administrator, Civil Service (Medical): No    Lack of Transportation (Non-Medical): No  Physical Activity: Unknown (05/21/2023)   Exercise Vital Sign    Days of Exercise per  Week: 0 days    Minutes of Exercise per Session: Not on file  Stress: No Stress Concern Present (05/21/2023)   [employer] of Occupational Health - Occupational Stress Questionnaire    Feeling of Stress : Not at all  Social Connections: Socially Isolated (05/21/2023)   Social Connection and Isolation Panel [NHANES]    Frequency of Communication with Friends and Family: Once a week    Frequency of Social Gatherings with Friends and Family: Once a week    Attends Religious Services: 1 to 4 times per year    Active Member of [employer] or Organizations: No    Attends Engineer, structural: Not on file    Marital Status: Divorced    Family History  Problem Relation Age of Onset   Hypertension Father    Colon polyps Father    Uterine cancer Maternal Aunt    Testicular  cancer Paternal Uncle    Uterine cancer Maternal Grandmother    Colon cancer Neg Hx    Pancreatic cancer Neg Hx    Stomach cancer Neg Hx    Liver cancer Neg Hx    Esophageal cancer Neg Hx    Rectal cancer Neg Hx     Health Maintenance  Topic Date Due   Diabetic kidney evaluation - Urine ACR  05/12/2023   Zoster Vaccines- Shingrix (2 of 2) 08/21/2023 (Originally 07/06/2022)   OPHTHALMOLOGY EXAM  06/03/2023   Diabetic kidney evaluation - eGFR measurement  11/09/2023   FOOT EXAM  11/09/2023   HEMOGLOBIN A1C  11/19/2023   DTaP/Tdap/Td (3 - Td or Tdap) 01/15/2028   Colonoscopy  05/22/2031   INFLUENZA VACCINE  Completed   COVID-19 Vaccine  Completed   Hepatitis C Screening  Completed   HIV Screening  Completed   HPV VACCINES  Aged Out     ----------------------------------------------------------------------------------------------------------------------------------------------------------------------------------------------------------------- Physical Exam BP 135/79 (BP Location: Left Arm, Patient Position: Sitting, Cuff Size: Large)   Pulse 65   Ht 5\' 7"  (1.702 m)   Wt 298 lb (135.2 kg)   SpO2 97%   BMI 46.67 kg/m   Physical Exam Constitutional:      Appearance: Normal appearance.  HENT:     Head: Normocephalic and atraumatic.  Eyes:     General: No scleral icterus. Cardiovascular:     Rate and Rhythm: Normal rate and regular rhythm.  Pulmonary:     Effort: Pulmonary effort is normal.     Breath sounds: Normal breath sounds.  Musculoskeletal:     Cervical back: Neck supple.     Comments: Left knee normal to inspection without effusion.  Tenderness to palpation with compression of the patella.  No ligament laxity.  Negative meniscal provocation testing  Neurological:     General: No focal deficit present.     Mental Status: He is alert.  Psychiatric:        Mood and Affect: Mood normal.        Behavior: Behavior normal.      ------------------------------------------------------------------------------------------------------------------------------------------------------------------------------------------------------------------- Assessment and Plan  Type 2 diabetes mellitus with obesity (HCC) Unable to tolerate Mounjaro.  Diabetes control is worsened.  Encourage dietary changes.  Recommend adding metformin.  Left knee pain X-rays of the left knee ordered.  Adding meloxicam x 1 week then as needed.  Given handout for patellofemoral syndrome rehab.   Meds ordered this encounter  Medications   meloxicam (MOBIC) 15 MG tablet    Sig: Take 1 tablet (15 mg total) by mouth daily.    Dispense:  30 tablet    Refill:  0    Return in about 4 months (around 09/21/2023) for Type 2 Diabetes.    This visit occurred during the SARS-CoV-2 public health emergency.  Safety protocols were in place, including screening questions prior to the visit, additional usage of staff PPE, and extensive cleaning of exam room while observing appropriate contact time as indicated for disinfecting solutions.

## 2023-05-22 NOTE — Patient Instructions (Signed)
Try adding meloxicam daily x7 days then daily as needed.  You can try adding glucosamine and chondroitin (osteobiflex or similar)

## 2023-05-22 NOTE — Assessment & Plan Note (Signed)
Unable to tolerate Mounjaro.  Diabetes control is worsened.  Encourage dietary changes.  Recommend adding metformin.

## 2023-05-22 NOTE — Assessment & Plan Note (Signed)
X-rays of the left knee ordered.  Adding meloxicam x 1 week then as needed.  Given handout for patellofemoral syndrome rehab.

## 2023-05-23 LAB — POCT UA - MICROALBUMIN
Albumin/Creatinine Ratio, Urine, POC: <30
Creatinine, POC: 200 mg/dL
Microalbumin Ur, POC: 30 mg/L

## 2023-05-23 NOTE — Addendum Note (Signed)
Addended by: Ardyth Man on: 05/23/2023 08:03 AM   Modules accepted: Orders

## 2023-08-25 ENCOUNTER — Ambulatory Visit: Payer: No Typology Code available for payment source | Admitting: Urology

## 2023-09-12 ENCOUNTER — Ambulatory Visit (INDEPENDENT_AMBULATORY_CARE_PROVIDER_SITE_OTHER): Payer: BC Managed Care – PPO | Admitting: Urology

## 2023-09-12 ENCOUNTER — Encounter: Payer: Self-pay | Admitting: Urology

## 2023-09-12 VITALS — BP 123/80 | HR 71 | Ht 67.0 in | Wt 290.0 lb

## 2023-09-12 DIAGNOSIS — Z87898 Personal history of other specified conditions: Secondary | ICD-10-CM

## 2023-09-12 DIAGNOSIS — R972 Elevated prostate specific antigen [PSA]: Secondary | ICD-10-CM

## 2023-09-12 DIAGNOSIS — N400 Enlarged prostate without lower urinary tract symptoms: Secondary | ICD-10-CM

## 2023-09-12 DIAGNOSIS — R310 Gross hematuria: Secondary | ICD-10-CM

## 2023-09-12 DIAGNOSIS — N200 Calculus of kidney: Secondary | ICD-10-CM

## 2023-09-12 LAB — URINALYSIS, ROUTINE W REFLEX MICROSCOPIC
Bilirubin, UA: NEGATIVE
Glucose, UA: NEGATIVE
Ketones, UA: NEGATIVE
Leukocytes,UA: NEGATIVE
Nitrite, UA: NEGATIVE
Protein,UA: NEGATIVE
Specific Gravity, UA: >1.03 — ABNORMAL HIGH (ref 1.005–1.030)
Urobilinogen, Ur: 0.2 mg/dL (ref 0.2–1.0)
pH, UA: 6 (ref 5.0–7.5)

## 2023-09-12 LAB — MICROSCOPIC EXAMINATION

## 2023-09-12 NOTE — Progress Notes (Signed)
 Assessment: 1. Elevated PSA; negative biopsy 3/24   2. Gross hematuria: negative evaluation 2/24   3. Nephrolithiasis   4. BPH without urinary obstruction     Plan: PSA today Return to office in 1 year   Chief Complaint:  Chief Complaint  Patient presents with   Elevated PSA    History of Present Illness:  Roberto Haverland. is a 53 y.o. male who is seen for continued evaluation of elevated PSA, history of gross hematuria, and nephrolithiasis.  He has a recent history of intermittent gross hematuria.  He had an episode of gross hematuria in November 2023.  He reported passing blood in the urine at that time.  No flank pain or dysuria.  The hematuria resolved spontaneously.  Dipstick urinalysis from 09/27/2022 was negative.  Urine culture showed no growth.  He reports 2-3 episodes of gross hematuria since that time.  He previously underwent CT imaging in November 2022 which showed a few scattered stones in both kidneys without hydronephrosis, no ureteral calculi, and no suspicious renal masses.  Renal ultrasound from 08/18/2022 showed a 9 mm echogenic focus within the upper pole of the right kidney felt to represent a stone, 2.9 cm hypoechoic structure in the interpolar right kidney felt to represent a cyst, and mild prostatomegaly. He reports occasional bilateral low back pain. He has a prior history of kidney stones passing approximately 7 stones in total.  He has not required any surgical management for kidney stones.  He last passed a stone in December 2023.  He was found to have an elevated PSA in January 2024.  PSA was 8.5 with 12% free. No prior PSA results available for review.  No history of prostatitis or UTIs.  No family history of prostate cancer.  He reported a intermittent slow urine stream and nocturia 0-1 time.  He had occasional incontinence associated with coughing. IPSS = 7.  CT hematuria protocol from 10/19/2022 showed small bilateral renal calculi, no renal mass  or evidence of obstruction, no ureteral calculi, enlarged prostate with heterogeneous and nodular appearance. Cystoscopy from 10/21/22 showed lateral lobe enlargement of the prostate, elevated bladder neck, and no mucosal lesions in bladder.  Urine cytology was negative for malignancy.  He underwent transrectal ultrasound and biopsy of the prostate on 11/16/22. PSA: 8.5 ng/ml TRUS volume:  80.2 ml  PSA density:  0.11 Biopsy results:  benign prostate tissue with one area of focal chronic prostatitis  PSA from 6/24:  0.8 with 32% free           He returns today for follow-up.  He reports that his urinary symptoms are stable.  He does have occasional decreased force of stream.  No dysuria or gross hematuria. IPSS = 5 today.   Portions of the above documentation were copied from a prior visit for review purposes only.   Past Medical History:  Past Medical History:  Diagnosis Date   Allergy    Anxiety    GERD (gastroesophageal reflux disease)    Headache(784.0)    History of kidney stones    Obesity    Sleep apnea    on BPAP since June    Past Surgical History:  Past Surgical History:  Procedure Laterality Date   CHOLECYSTECTOMY     FLEXIBLE SIGMOIDOSCOPY N/A 12/23/2021   Procedure: FLEXIBLE SIGMOIDOSCOPY;  Surgeon: Teresa Lonni HERO, MD;  Location: [facility];  Service: General;  Laterality: N/A;   MANDIBLE SURGERY  1995   right hand surgery  VASECTOMY  2010   XI ROBOTIC ASSISTED LOWER ANTERIOR RESECTION N/A 12/23/2021   Procedure: XI ROBOTIC ASSISTED LOWER ANTERIOR RESECTION, BILATERAL TAP BLOCK;  Surgeon: Teresa Lonni HERO, MD;  Location: [facility];  Service: General;  Laterality: N/A;    Allergies:  Allergies  Allergen Reactions   Amoxicillin Other (See Comments)    Causes blood in intestines- Had once incidence of bloody diarrhea but thinks he has tolerated since this incidence     Family History:  Family History  Problem Relation Age of Onset   Hypertension Father     Colon polyps Father    Uterine cancer Maternal Aunt    Testicular cancer Paternal Uncle    Uterine cancer Maternal Grandmother    Colon cancer Neg Hx    Pancreatic cancer Neg Hx    Stomach cancer Neg Hx    Liver cancer Neg Hx    Esophageal cancer Neg Hx    Rectal cancer Neg Hx     Social History:  Social History   Tobacco Use   Smoking status: Never   Smokeless tobacco: Never  Vaping Use   Vaping status: Never Used  Substance Use Topics   Alcohol use: Yes    Alcohol/week: 2.0 standard drinks of alcohol    Types: 2 Standard drinks or equivalent per week    Comment: social   Drug use: No    ROS: Constitutional:  Negative for fever, chills, weight loss CV: Negative for chest pain, previous MI, hypertension Respiratory:  Negative for shortness of breath, wheezing, sleep apnea, frequent cough GI:  Negative for nausea, vomiting, bloody stool, GERD  Physical exam: BP 123/80   Pulse 71   Ht 5' 7 (1.702 m)   Wt 290 lb (131.5 kg)   BMI 45.42 kg/m  GENERAL APPEARANCE:  Well appearing, well developed, well nourished, NAD HEENT:  Atraumatic, normocephalic, oropharynx clear NECK:  Supple without lymphadenopathy or thyromegaly ABDOMEN:  Soft, non-tender, no masses EXTREMITIES:  Moves all extremities well, without clubbing, cyanosis, or edema NEUROLOGIC:  Alert and oriented x 3, normal gait, CN II-XII grossly intact MENTAL STATUS:  appropriate BACK:  Non-tender to palpation, No CVAT SKIN:  Warm, dry, and intact GU: Prostate: 60 g, NT, no nodules Rectum: Normal tone,  no masses or tenderness   Results: U/A: 0-5 WBC, 0-2 RBC

## 2023-09-13 ENCOUNTER — Encounter: Payer: Self-pay | Admitting: Urology

## 2023-09-13 LAB — PSA: Prostate Specific Ag, Serum: 1.2 ng/mL (ref 0.0–4.0)

## 2023-09-18 DIAGNOSIS — J069 Acute upper respiratory infection, unspecified: Secondary | ICD-10-CM | POA: Diagnosis not present

## 2023-09-18 DIAGNOSIS — R52 Pain, unspecified: Secondary | ICD-10-CM | POA: Diagnosis not present

## 2023-09-18 DIAGNOSIS — R059 Cough, unspecified: Secondary | ICD-10-CM | POA: Diagnosis not present

## 2023-09-18 DIAGNOSIS — R519 Headache, unspecified: Secondary | ICD-10-CM | POA: Diagnosis not present

## 2023-09-21 ENCOUNTER — Ambulatory Visit: Payer: No Typology Code available for payment source | Admitting: Family Medicine

## 2023-09-27 ENCOUNTER — Ambulatory Visit: Payer: No Typology Code available for payment source | Admitting: Family Medicine

## 2023-10-03 ENCOUNTER — Ambulatory Visit (INDEPENDENT_AMBULATORY_CARE_PROVIDER_SITE_OTHER): Payer: BC Managed Care – PPO | Admitting: Family Medicine

## 2023-10-03 VITALS — BP 132/84 | HR 60 | Ht 67.0 in | Wt 281.8 lb

## 2023-10-03 DIAGNOSIS — Z1159 Encounter for screening for other viral diseases: Secondary | ICD-10-CM

## 2023-10-03 DIAGNOSIS — Z113 Encounter for screening for infections with a predominantly sexual mode of transmission: Secondary | ICD-10-CM | POA: Insufficient documentation

## 2023-10-03 DIAGNOSIS — E669 Obesity, unspecified: Secondary | ICD-10-CM | POA: Diagnosis not present

## 2023-10-03 DIAGNOSIS — E1165 Type 2 diabetes mellitus with hyperglycemia: Secondary | ICD-10-CM | POA: Diagnosis not present

## 2023-10-03 DIAGNOSIS — E782 Mixed hyperlipidemia: Secondary | ICD-10-CM

## 2023-10-03 DIAGNOSIS — Z114 Encounter for screening for human immunodeficiency virus [HIV]: Secondary | ICD-10-CM | POA: Diagnosis not present

## 2023-10-03 DIAGNOSIS — E1169 Type 2 diabetes mellitus with other specified complication: Secondary | ICD-10-CM

## 2023-10-03 DIAGNOSIS — Z23 Encounter for immunization: Secondary | ICD-10-CM | POA: Diagnosis not present

## 2023-10-03 DIAGNOSIS — Z7984 Long term (current) use of oral hypoglycemic drugs: Secondary | ICD-10-CM

## 2023-10-03 LAB — POCT GLYCOSYLATED HEMOGLOBIN (HGB A1C): HbA1c, POC (controlled diabetic range): 6.4 % (ref 0.0–7.0)

## 2023-10-03 MED ORDER — AZITHROMYCIN 250 MG PO TABS: ORAL_TABLET | ORAL | 0 refills | Status: AC

## 2023-10-03 NOTE — Assessment & Plan Note (Signed)
A1c improved. He has done well with metformin at current strength.  Tried mounjaro but felt gassy while taking.  He will plan to try this again.

## 2023-10-03 NOTE — Progress Notes (Signed)
Roberto Charles. - 53 y.o. male MRN [id number]  Date of birth: 1971/08/29  Subjective Chief Complaint  Patient presents with   Exposure to STD   Travel Consult    HPI Roberto Charles Roberto Charles. is a 53 y.o. male here today for follow up visit.   He reports that he has a new sexual partner and would like to have STI testing.  He does not have symptoms including dysuria, discharge, or testicular pain.  He will be traveling to [place] soon as well and would like to discuss recommended immunizations.   His blood sugars are still pretty well controlled with metformin and dietary changes.  He denies side effects at this time.   ROS:  A comprehensive ROS was completed and negative except as noted per HPI    Allergies  Allergen Reactions   Amoxicillin Other (See Comments)    Causes blood in intestines- Had once incidence of bloody diarrhea but thinks he has tolerated since this incidence     Past Medical History:  Diagnosis Date   Allergy    Anxiety    GERD (gastroesophageal reflux disease)    Headache(784.0)    History of kidney stones    Obesity    Sleep apnea    on BPAP since June    Past Surgical History:  Procedure Laterality Date   CHOLECYSTECTOMY     FLEXIBLE SIGMOIDOSCOPY N/A 12/23/2021   Procedure: FLEXIBLE SIGMOIDOSCOPY;  Surgeon: Andria Meuse, MD;  Location: [facility];  Service: General;  Laterality: N/A;   MANDIBLE SURGERY  1995   right hand surgery      VASECTOMY  2010   XI ROBOTIC ASSISTED LOWER ANTERIOR RESECTION N/A 12/23/2021   Procedure: XI ROBOTIC ASSISTED LOWER ANTERIOR RESECTION, BILATERAL TAP BLOCK;  Surgeon: Andria Meuse, MD;  Location: [facility];  Service: General;  Laterality: N/A;    Social History   Socioeconomic History   Marital status: Divorced    Spouse name: Not on file   Number of children: 2   Years of education: Not on file   Highest education level: Bachelor's degree (e.g., BA, AB, BS)  Occupational History   Occupation:  Insurance  Tobacco Use   Smoking status: Never   Smokeless tobacco: Never  Vaping Use   Vaping status: Never Used  Substance and Sexual Activity   Alcohol use: Yes    Alcohol/week: 2.0 standard drinks of alcohol    Types: 2 Standard drinks or equivalent per week    Comment: social   Drug use: No   Sexual activity: Yes    Partners: Female    Birth control/protection: Other-see comments    Comment: Vasectomy  Other Topics Concern   Not on file  Social History Narrative   Not on file   Social Drivers of Health   Financial Resource Strain: Low Risk  (10/03/2023)   Overall Financial Resource Strain (CARDIA)    Difficulty of Paying Living Expenses: Not hard at all  Food Insecurity: No Food Insecurity (10/03/2023)   Hunger Vital Sign    Worried About Running Out of Food in the Last Year: Never true    Ran Out of Food in the Last Year: Never true  Transportation Needs: No Transportation Needs (10/03/2023)   PRAPARE - Administrator, Civil Service (Medical): No    Lack of Transportation (Non-Medical): No  Physical Activity: Insufficiently Active (10/03/2023)   Exercise Vital Sign    Days of Exercise per Week: 1  day    Minutes of Exercise per Session: 10 min  Stress: No Stress Concern Present (10/03/2023)   [employer] of Occupational Health - Occupational Stress Questionnaire    Feeling of Stress : Not at all  Social Connections: Socially Isolated (10/03/2023)   Social Connection and Isolation Panel [NHANES]    Frequency of Communication with Friends and Family: More than three times a week    Frequency of Social Gatherings with Friends and Family: Once a week    Attends Religious Services: Never    Database administrator or Organizations: No    Attends Engineer, structural: Not on file    Marital Status: Divorced    Family History  Problem Relation Age of Onset   Hypertension Father    Colon polyps Father    Uterine cancer Maternal Aunt     Testicular cancer Paternal Uncle    Uterine cancer Maternal Grandmother    Colon cancer Neg Hx    Pancreatic cancer Neg Hx    Stomach cancer Neg Hx    Liver cancer Neg Hx    Esophageal cancer Neg Hx    Rectal cancer Neg Hx     Health Maintenance  Topic Date Due   Zoster Vaccines- Shingrix (2 of 2) 07/06/2022   OPHTHALMOLOGY EXAM  06/03/2023   Pneumococcal Vaccine 60-21 Years old (1 of 2 - PCV) 10/02/2024 (Originally 07/15/1977)   Diabetic kidney evaluation - eGFR measurement  11/09/2023   FOOT EXAM  11/09/2023   HEMOGLOBIN A1C  04/01/2024   Diabetic kidney evaluation - Urine ACR  05/21/2024   DTaP/Tdap/Td (3 - Td or Tdap) 01/15/2028   Colonoscopy  05/22/2031   INFLUENZA VACCINE  Completed   COVID-19 Vaccine  Completed   Hepatitis C Screening  Completed   HIV Screening  Completed   HPV VACCINES  Aged Out     ----------------------------------------------------------------------------------------------------------------------------------------------------------------------------------------------------------------- Physical Exam BP 132/84 (BP Location: Left Arm, Patient Position: Sitting, Cuff Size: Large)   Pulse 60   Ht 5\' 7"  (1.702 m)   Wt 281 lb 12.8 oz (127.8 kg)   SpO2 98%   BMI 44.14 kg/m   Physical Exam Constitutional:      Appearance: Normal appearance.  Cardiovascular:     Rate and Rhythm: Normal rate and regular rhythm.  Pulmonary:     Effort: Pulmonary effort is normal.     Breath sounds: Normal breath sounds.  Neurological:     General: No focal deficit present.     Mental Status: He is alert.  Psychiatric:        Mood and Affect: Mood normal.        Behavior: Behavior normal.     ------------------------------------------------------------------------------------------------------------------------------------------------------------------------------------------------------------------- Assessment and Plan  Type 2 diabetes mellitus with obesity  (HCC) A1c improved. He has done well with metformin at current strength.  Tried mounjaro but felt gassy while taking.  He will plan to try this again.   Mixed hyperlipidemia Update lipid panel.   Screening examination for STI Screening per orders.    Meds ordered this encounter  Medications   azithromycin (ZITHROMAX) 250 MG tablet    Sig: Take 2 tablets on day 1, then 1 tablet daily on days 2 through 5    Dispense:  6 tablet    Refill:  0    No follow-ups on file.    This visit occurred during the SARS-CoV-2 public health emergency.  Safety protocols were in place, including screening questions prior to the visit, additional  usage of staff PPE, and extensive cleaning of exam room while observing appropriate contact time as indicated for disinfecting solutions.

## 2023-10-03 NOTE — Assessment & Plan Note (Signed)
Screening per orders.

## 2023-10-03 NOTE — Assessment & Plan Note (Signed)
Update lipid panel.

## 2023-10-06 LAB — CBC WITH DIFFERENTIAL/PLATELET
Basophils Absolute: 0 10*3/uL (ref 0.0–0.2)
Basos: 0 %
EOS (ABSOLUTE): 0.1 10*3/uL (ref 0.0–0.4)
Eos: 1 %
Hematocrit: 44.7 % (ref 37.5–51.0)
Hemoglobin: 14.5 g/dL (ref 13.0–17.7)
Immature Grans (Abs): 0 10*3/uL (ref 0.0–0.1)
Immature Granulocytes: 0 %
Lymphocytes Absolute: 1.9 10*3/uL (ref 0.7–3.1)
Lymphs: 23 %
MCH: 27.4 pg (ref 26.6–33.0)
MCHC: 32.4 g/dL (ref 31.5–35.7)
MCV: 85 fL (ref 79–97)
Monocytes Absolute: 0.5 10*3/uL (ref 0.1–0.9)
Monocytes: 6 %
Neutrophils Absolute: 5.7 10*3/uL (ref 1.4–7.0)
Neutrophils: 70 %
Platelets: 220 10*3/uL (ref 150–450)
RBC: 5.29 x10E6/uL (ref 4.14–5.80)
RDW: 14.5 % (ref 11.6–15.4)
WBC: 8.2 10*3/uL (ref 3.4–10.8)

## 2023-10-06 LAB — LIPID PANEL WITH LDL/HDL RATIO
Cholesterol, Total: 199 mg/dL (ref 100–199)
HDL: 39 mg/dL — ABNORMAL LOW (ref >39)
LDL Chol Calc (NIH): 124 mg/dL — ABNORMAL HIGH (ref 0–99)
LDL/HDL Ratio: 3.2 {ratio} (ref 0.0–3.6)
Triglycerides: 204 mg/dL — ABNORMAL HIGH (ref 0–149)
VLDL Cholesterol Cal: 36 mg/dL (ref 5–40)

## 2023-10-06 LAB — HIV ANTIBODY (ROUTINE TESTING W REFLEX): HIV Screen 4th Generation wRfx: NONREACTIVE

## 2023-10-06 LAB — CHLAMYDIA/GONOCOCCUS/TRICHOMONAS, NAA
Chlamydia by NAA: NEGATIVE
Gonococcus by NAA: NEGATIVE
Trich vag by NAA: NEGATIVE

## 2023-10-06 LAB — RPR: RPR Ser Ql: NONREACTIVE

## 2023-10-06 LAB — HEPATITIS C ANTIBODY: Hep C Virus Ab: NONREACTIVE

## 2023-10-10 DIAGNOSIS — G4733 Obstructive sleep apnea (adult) (pediatric): Secondary | ICD-10-CM | POA: Diagnosis not present

## 2023-10-16 ENCOUNTER — Encounter: Payer: Self-pay | Admitting: Family Medicine

## 2023-10-16 MED ORDER — TIRZEPATIDE 5 MG/0.5ML ~~LOC~~ SOAJ: 5.0000 mg | SUBCUTANEOUS | 0 refills | Status: DC

## 2023-10-16 MED ORDER — TIRZEPATIDE 7.5 MG/0.5ML ~~LOC~~ SOAJ: 7.5000 mg | SUBCUTANEOUS | 1 refills | Status: DC

## 2023-10-19 ENCOUNTER — Other Ambulatory Visit: Payer: Self-pay

## 2023-10-19 ENCOUNTER — Emergency Department (HOSPITAL_BASED_OUTPATIENT_CLINIC_OR_DEPARTMENT_OTHER): Payer: BC Managed Care – PPO

## 2023-10-19 ENCOUNTER — Emergency Department (HOSPITAL_BASED_OUTPATIENT_CLINIC_OR_DEPARTMENT_OTHER)
Admission: EM | Admit: 2023-10-19 | Discharge: 2023-10-19 | Disposition: A | Discharge status: Home / Self Care | Payer: BC Managed Care – PPO | Attending: Emergency Medicine | Admitting: Emergency Medicine

## 2023-10-19 DIAGNOSIS — R519 Headache, unspecified: Secondary | ICD-10-CM | POA: Diagnosis not present

## 2023-10-19 DIAGNOSIS — E119 Type 2 diabetes mellitus without complications: Secondary | ICD-10-CM | POA: Diagnosis not present

## 2023-10-19 DIAGNOSIS — Z7984 Long term (current) use of oral hypoglycemic drugs: Secondary | ICD-10-CM | POA: Insufficient documentation

## 2023-10-19 DIAGNOSIS — H538 Other visual disturbances: Secondary | ICD-10-CM | POA: Diagnosis not present

## 2023-10-19 DIAGNOSIS — R200 Anesthesia of skin: Secondary | ICD-10-CM | POA: Diagnosis not present

## 2023-10-19 DIAGNOSIS — R079 Chest pain, unspecified: Secondary | ICD-10-CM | POA: Diagnosis not present

## 2023-10-19 DIAGNOSIS — G43809 Other migraine, not intractable, without status migrainosus: Secondary | ICD-10-CM | POA: Insufficient documentation

## 2023-10-19 DIAGNOSIS — R9431 Abnormal electrocardiogram [ECG] [EKG]: Secondary | ICD-10-CM | POA: Diagnosis not present

## 2023-10-19 LAB — COMPREHENSIVE METABOLIC PANEL
ALT: 59 U/L — ABNORMAL HIGH (ref 0–44)
AST: 30 U/L (ref 15–41)
Albumin: 4.2 g/dL (ref 3.5–5.0)
Alkaline Phosphatase: 96 U/L (ref 38–126)
Anion gap: 10 (ref 5–15)
BUN: 13 mg/dL (ref 6–20)
CO2: 23 mmol/L (ref 22–32)
Calcium: 8.7 mg/dL — ABNORMAL LOW (ref 8.9–10.3)
Chloride: 105 mmol/L (ref 98–111)
Creatinine, Ser: 0.67 mg/dL (ref 0.61–1.24)
GFR, Estimated: >60 mL/min (ref >60)
Glucose, Bld: 93 mg/dL (ref 70–99)
Potassium: 3.5 mmol/L (ref 3.5–5.1)
Sodium: 138 mmol/L (ref 135–145)
Total Bilirubin: 1.1 mg/dL (ref 0.0–1.2)
Total Protein: 7.4 g/dL (ref 6.5–8.1)

## 2023-10-19 LAB — CBC WITH DIFFERENTIAL/PLATELET
Abs Immature Granulocytes: 0.04 10*3/uL (ref 0.00–0.07)
Basophils Absolute: 0 10*3/uL (ref 0.0–0.1)
Basophils Relative: 0 %
Eosinophils Absolute: 0.1 10*3/uL (ref 0.0–0.5)
Eosinophils Relative: 1 %
HCT: 45.3 % (ref 39.0–52.0)
Hemoglobin: 15.1 g/dL (ref 13.0–17.0)
Immature Granulocytes: 1 %
Lymphocytes Relative: 23 %
Lymphs Abs: 1.9 10*3/uL (ref 0.7–4.0)
MCH: 27.4 pg (ref 26.0–34.0)
MCHC: 33.3 g/dL (ref 30.0–36.0)
MCV: 82.2 fL (ref 80.0–100.0)
Monocytes Absolute: 0.6 10*3/uL (ref 0.1–1.0)
Monocytes Relative: 7 %
Neutro Abs: 5.6 10*3/uL (ref 1.7–7.7)
Neutrophils Relative %: 68 %
Platelets: 194 10*3/uL (ref 150–400)
RBC: 5.51 MIL/uL (ref 4.22–5.81)
RDW: 14.4 % (ref 11.5–15.5)
WBC: 8.2 10*3/uL (ref 4.0–10.5)
nRBC: 0 % (ref 0.0–0.2)

## 2023-10-19 LAB — RESP PANEL BY RT-PCR (RSV, FLU A&B, COVID)  RVPGX2
Influenza A by PCR: NEGATIVE
Influenza B by PCR: NEGATIVE
Resp Syncytial Virus by PCR: NEGATIVE
SARS Coronavirus 2 by RT PCR: NEGATIVE

## 2023-10-19 MED ORDER — SODIUM CHLORIDE 0.9 % IV SOLN
25.0000 mg | INTRAVENOUS | Status: DC | PRN
  Filled 2023-10-19: qty 0.5

## 2023-10-19 MED ORDER — DIPHENHYDRAMINE HCL 50 MG/ML IJ SOLN
INTRAMUSCULAR | Status: AC
  Filled 2023-10-19: qty 1

## 2023-10-19 MED ORDER — KETOROLAC TROMETHAMINE 15 MG/ML IJ SOLN
15.0000 mg | Freq: Once | INTRAMUSCULAR | Status: AC
  Administered 2023-10-19: 15 mg via INTRAVENOUS
  Filled 2023-10-19: qty 1

## 2023-10-19 MED ORDER — SODIUM CHLORIDE 0.9 % IV BOLUS
1000.0000 mL | Freq: Once | INTRAVENOUS | Status: AC
  Administered 2023-10-19: 1000 mL via INTRAVENOUS

## 2023-10-19 MED ORDER — ACETAMINOPHEN 325 MG PO TABS
650.0000 mg | ORAL_TABLET | Freq: Once | ORAL | Status: DC
  Filled 2023-10-19: qty 2

## 2023-10-19 MED ORDER — PROCHLORPERAZINE EDISYLATE 10 MG/2ML IJ SOLN
10.0000 mg | Freq: Once | INTRAMUSCULAR | Status: AC
  Administered 2023-10-19: 10 mg via INTRAVENOUS
  Filled 2023-10-19: qty 2

## 2023-10-19 MED ORDER — MAGNESIUM SULFATE 2 GM/50ML IV SOLN
2.0000 g | Freq: Once | INTRAVENOUS | Status: AC
  Administered 2023-10-19: 2 g via INTRAVENOUS
  Filled 2023-10-19: qty 50

## 2023-10-19 NOTE — ED Provider Notes (Signed)
[place] [facility] Provider Note   CSN: [id number] Arrival date & time: 10/19/23  1140     History  Chief Complaint  Patient presents with   Headache    Roberto Charles. is a 53 y.o. male past medical history significant for morbid obesity, hyperlipidemia, diabetes, who presents with concern for headache which is been ongoing for 5 days but worsening over the course of 5 days, associated with some blurry vision and right sided facial numbness over the last day.  He reports no recent head injury, no history of stroke.  Was seen at urgent care and sent for further evaluation.  Reports decreased appetite but takes Mounjaro.   Headache      Home Medications Prior to Admission medications   Medication Sig Start Date End Date Taking? Authorizing Provider  cetirizine (ZYRTEC) 10 MG tablet Take 10 mg by mouth daily.    [provider]  meloxicam (MOBIC) 15 MG tablet Take 1 tablet (15 mg total) by mouth daily. 05/22/23   Everrett Coombe, DO  metFORMIN (GLUCOPHAGE) 500 MG tablet TAKE 1 TABLET DAILY WITH   BREAKFAST 05/15/23   Everrett Coombe, DO  tirzepatide [facility]) 5 MG/0.5ML Pen Inject 5 mg into the skin once a week. 10/16/23   Everrett Coombe, DO  tirzepatide [facility]) 7.5 MG/0.5ML Pen Inject 7.5 mg into the skin once a week. 10/16/23   Everrett Coombe, DO  triamcinolone (NASACORT) 55 MCG/ACT AERO nasal inhaler Place 2 sprays into the nose daily.    [provider]      Allergies    Amoxicillin    Review of Systems   Review of Systems  Neurological:  Positive for headaches.  All other systems reviewed and are negative.   Physical Exam Updated Vital Signs BP (!) 114/59   Pulse 63   Temp 97.6 F (36.4 C) (Oral)   Resp 18   SpO2 95%  Physical Exam Vitals and nursing note reviewed.  Constitutional:      General: He is not in acute distress.    Appearance: Normal appearance.  HENT:     Head: Normocephalic and  atraumatic.  Eyes:     General:        Right eye: No discharge.        Left eye: No discharge.  Cardiovascular:     Rate and Rhythm: Normal rate and regular rhythm.     Heart sounds: No murmur heard.    No friction rub. No gallop.  Pulmonary:     Effort: Pulmonary effort is normal.     Breath sounds: Normal breath sounds.  Abdominal:     General: Bowel sounds are normal.     Palpations: Abdomen is soft.  Skin:    General: Skin is warm and dry.     Capillary Refill: Capillary refill takes less than 2 seconds.  Neurological:     Mental Status: He is alert and oriented to person, place, and time.     Comments: Cranial nerves II through XII grossly intact.  Intact finger-nose, intact heel-to-shin.  Romberg negative, gait normal.  Alert and oriented x3.  Moves all 4 limbs spontaneously, normal coordination.  No pronator drift.  Intact strength 5 out of 5 bilateral upper and lower extremities.  He endorses some sensory deficit in the V1, V2 distribution of the face, but otherwise with no focal deficits.  He endorses some blurry vision on EOM testing but denies seeing 2 objects next to  each other just reports some difficulty focusing.  Psychiatric:        Mood and Affect: Mood normal.        Behavior: Behavior normal.     ED Results / Procedures / Treatments   Labs (all labs ordered are listed, but only abnormal results are displayed) Labs Reviewed  COMPREHENSIVE METABOLIC PANEL - Abnormal; Notable for the following components:      Result Value   Calcium 8.7 (*)    ALT 59 (*)    All other components within normal limits  RESP PANEL BY RT-PCR (RSV, FLU A&B, COVID)  RVPGX2  CBC WITH DIFFERENTIAL/PLATELET    EKG None  Radiology DG Chest 1 View Result Date: 10/19/2023 CLINICAL DATA:  Pain. EXAM: CHEST  1 VIEW COMPARISON:  06/04/2020. FINDINGS: Bilateral lung fields are clear. Bilateral costophrenic angles are clear. Normal cardio-mediastinal silhouette. No acute osseous  abnormalities. The soft tissues are within normal limits. IMPRESSION: No active disease. Electronically Signed   By: Jules Schick M.D.   On: 10/19/2023 14:34   CT Head Wo Contrast Result Date: 10/19/2023 CLINICAL DATA:  Headache, increasing frequency or severity. Facial and hand numbness. EXAM: CT HEAD WITHOUT CONTRAST TECHNIQUE: Contiguous axial images were obtained from the base of the skull through the vertex without intravenous contrast. RADIATION DOSE REDUCTION: This exam was performed according to the departmental dose-optimization program which includes automated exposure control, adjustment of the mA and/or kV according to patient size and/or use of iterative reconstruction technique. COMPARISON:  CTA head and neck 06/04/2020 FINDINGS: Brain: There is no evidence of an acute infarct, intracranial hemorrhage, mass, midline shift, or extra-axial fluid collection. Cerebral volume is normal. The ventricles are normal in size. An 8 mm exostosis along the posterior aspect of the left petrous bone is unchanged. Vascular: No hyperdense vessel. Skull: No acute fracture or suspicious osseous lesion. Prior internal fixation of the maxilla bilaterally. Sinuses/Orbits: Mild mucosal thickening in the left maxillary sinus. Clear mastoid air cells. Unremarkable orbits. Other: None. IMPRESSION: No evidence of acute intracranial abnormality. Electronically Signed   By: Sebastian Ache M.D.   On: 10/19/2023 13:17    Procedures Procedures    Medications Ordered in ED Medications  acetaminophen (TYLENOL) tablet 650 mg (650 mg Oral Patient Refused/Not Given 10/19/23 1620)  diphenhydrAMINE (BENADRYL) 25 mg in sodium chloride 0.9 % 50 mL IVPB (has no administration in time range)  diphenhydrAMINE (BENADRYL) 50 MG/ML injection (  Not Given 10/19/23 1620)  sodium chloride 0.9 % bolus 1,000 mL (1,000 mLs Intravenous New Bag/Given 10/19/23 1618)  ketorolac (TORADOL) 15 MG/ML injection 15 mg (15 mg Intravenous Given 10/19/23  1613)  prochlorperazine (COMPAZINE) injection 10 mg (10 mg Intravenous Given 10/19/23 1613)  magnesium sulfate IVPB 2 g 50 mL (0 g Intravenous Stopped 10/19/23 1637)    ED Course/ Medical Decision Making/ A&P                                 Medical Decision Making Risk OTC drugs.   This patient is a 53 y.o. male  who presents to the ED for concern of headache, facial numbness.   Differential diagnoses prior to evaluation: The emergent differential diagnosis includes, but is not limited to,  Stroke, increased ICP, meningitis, CVA, intracranial tumor, venous sinus thrombosis, migraine, cluster headache, hypertension, drug related, head injury, tension headache, sinusitis, dental abscess, otitis media, TMJ, temporal arteritis, glaucoma, trigeminal neuralgia. . This is  not an exhaustive differential.   Past Medical History / Co-morbidities / Social History: morbid obesity, hyperlipidemia, diabetes  Physical Exam: Physical exam performed. The pertinent findings include: Cranial nerves II through XII grossly intact.  Intact finger-nose, intact heel-to-shin.  Romberg negative, gait normal.  Alert and oriented x3.  Moves all 4 limbs spontaneously, normal coordination.  No pronator drift.  Intact strength 5 out of 5 bilateral upper and lower extremities.  He endorses some sensory deficit in the V1, V2 distribution of the face, but otherwise with no focal deficits.  He endorses some blurry vision on EOM testing but denies seeing 2 objects next to each other just reports some difficulty focusing.   Lab Tests/Imaging studies: I personally interpreted labs/imaging and the pertinent results include: CBC unremarkable, CMP with very mild elevation of ALT at 59, may be secondary to his Mounjaro usage, otherwise unremarkable, RVP negative for COVID, flu, RSV.  I independently interpreted plain film chest x-ray which shows no evidence of acute intrathoracic abnormality, CT head which shows no evidence of  acute intracranial abnormality..  With his focal facial numbness and vision blurring consider transfer for MRI, however symptoms resolved after migraine cocktail, I think reasonable to forego any additional advanced imaging at this time, but discussed extensive return precautions to one of our facilities with an MRI if headache returns, facial numbness worsens or new neurologic symptoms to begin.  I agree with the radiologist interpretation.  Cardiac monitoring: EKG obtained and interpreted by myself and attending physician which shows: NSR, no acute st-t changes   Medications: I ordered medication including migraine cocktail for headache with resolution on reevaluation of headache and neurologic symptoms.  I have reviewed the patients home medicines and have made adjustments as needed.   Disposition: After consideration of the diagnostic results and the patients response to treatment, I feel that patient stable for discharge with plan as above, neuro follow up .   emergency department workup does not suggest an emergent condition requiring admission or immediate intervention beyond what has been performed at this time. The plan is: as above. The patient is safe for discharge and has been instructed to return immediately for worsening symptoms, change in symptoms or any other concerns.  Final Clinical Impression(s) / ED Diagnoses Final diagnoses:  Other migraine without status migrainosus, not intractable  Facial numbness    Rx / DC Orders ED Discharge Orders          Ordered    Ambulatory referral to Neurology       Comments: An appointment is requested in approximately: 4 weeks Follow-up facial numbness likely secondary to migraine for resolution   10/19/23 1727              Olene Floss, PA-C 10/19/23 1732    Glyn Ade, MD 10/19/23 2250

## 2023-10-19 NOTE — ED Provider Notes (Signed)
Called to triage to assess the pt He has HA last 4-5 days, congestion, sinus pressure, uri s/s Having some reduced sensation to V1, V2 on the right Currently has a headache NIHSS 1 (sensation), exam o/w stable Not tnk or MT candidate given LKN >24 hours ago    Sloan Leiter, DO 10/19/23 1157

## 2023-10-19 NOTE — Discharge Instructions (Signed)
I placed a referral for [facility], you should receive a call from them to schedule an appointment within around 72 hours, if you have not heard from them you can call the number I have placed on your discharge instructions on Monday to schedule a follow-up appointment if you are still having any ongoing headache or facial numbness.  If all of your symptoms resolved I do not think that you have to follow-up with neurology.  Return to the emergency department for further evaluation if your headache returns, facial numbness worsens, or you experience any new neurologic symptoms, I would recommend going to [facility] or [facility] if you do have worsening symptoms for MRI.

## 2023-10-19 NOTE — ED Triage Notes (Signed)
Pt sent from UC for right sided facial numbness, headache. Pt reports he had a headache 5 days

## 2023-10-20 ENCOUNTER — Encounter: Payer: Self-pay | Admitting: Family Medicine

## 2023-10-20 ENCOUNTER — Telehealth: Payer: Self-pay

## 2023-10-20 NOTE — Telephone Encounter (Signed)
Copied from CRM [id number]. Topic: Clinical - Prescription Issue >> Oct 20, 2023  9:51 AM Higinio Roger wrote: Reason for CRM:  Patient states that pharmacy need authorization for medication from the insurance ([employer])   tirzepatide [facility]) 5 MG/0.5ML Pen tirzepatide [facility]) 7.5 MG/0.5ML Pen  Patient would like a callback [phone]

## 2023-11-07 DIAGNOSIS — G4733 Obstructive sleep apnea (adult) (pediatric): Secondary | ICD-10-CM | POA: Diagnosis not present

## 2023-11-30 NOTE — Telephone Encounter (Signed)
 PA submitted for Mounjaro 5 mg. Auth pending for approval.

## 2023-12-04 ENCOUNTER — Other Ambulatory Visit (HOSPITAL_COMMUNITY): Payer: Self-pay

## 2023-12-04 ENCOUNTER — Telehealth: Payer: Self-pay

## 2023-12-04 NOTE — Telephone Encounter (Signed)
 Pharmacy Patient Advocate Encounter  Received notification from  [employer]  that Prior Authorization for [facility] has been DENIED.  Full denial letter will be uploaded to the media tab. See denial reason below.   PA #/Case ID/Reference #: [id number]

## 2023-12-07 ENCOUNTER — Other Ambulatory Visit (HOSPITAL_COMMUNITY): Payer: Self-pay

## 2023-12-07 ENCOUNTER — Telehealth: Payer: Self-pay

## 2023-12-07 NOTE — Telephone Encounter (Signed)
 Copied from CRM [id number]. Topic: General - Other >> Dec 07, 2023  3:57 PM Shamecia H wrote: Reason for CRM: Patient called because he had a missed call and then got disconnected, per the notes the insurance needed to be updated and I ran [employer] and it came back active, not sure if anyone was calling the patient for anything else, patient said he will be leaving to go out of town shortly and had to get off the phone. Patients callback number is [phone] he said you can try and catch him if he's not already gone, tried to transfer and he said he didn't have time.

## 2023-12-08 DIAGNOSIS — G4733 Obstructive sleep apnea (adult) (pediatric): Secondary | ICD-10-CM | POA: Diagnosis not present

## 2023-12-12 ENCOUNTER — Other Ambulatory Visit (HOSPITAL_COMMUNITY): Payer: Self-pay

## 2023-12-12 ENCOUNTER — Telehealth: Payer: Self-pay

## 2023-12-12 NOTE — Telephone Encounter (Signed)
error 

## 2023-12-12 NOTE — Telephone Encounter (Signed)
 Prior Authorization form/request asks a question that requires your assistance. Please see the question below and advise accordingly. The PA will not be submitted until the necessary information is received. I called the provider phone number from insurance card in pt media; [phone]. Per Beryle Lathe @ [employer], pt account is not active. We will need current insurance info to proceed with prior authorizations.

## 2023-12-17 DIAGNOSIS — R197 Diarrhea, unspecified: Secondary | ICD-10-CM | POA: Diagnosis not present

## 2024-01-05 ENCOUNTER — Ambulatory Visit: Payer: Self-pay

## 2024-01-05 ENCOUNTER — Ambulatory Visit
Admission: EM | Admit: 2024-01-05 | Discharge: 2024-01-05 | Disposition: A | Discharge status: Home / Self Care | Attending: Internal Medicine | Admitting: Internal Medicine

## 2024-01-05 ENCOUNTER — Other Ambulatory Visit: Payer: Self-pay

## 2024-01-05 DIAGNOSIS — Z20818 Contact with and (suspected) exposure to other bacterial communicable diseases: Secondary | ICD-10-CM

## 2024-01-05 DIAGNOSIS — R509 Fever, unspecified: Secondary | ICD-10-CM | POA: Diagnosis not present

## 2024-01-05 DIAGNOSIS — R051 Acute cough: Secondary | ICD-10-CM | POA: Diagnosis not present

## 2024-01-05 DIAGNOSIS — Z113 Encounter for screening for infections with a predominantly sexual mode of transmission: Secondary | ICD-10-CM

## 2024-01-05 MED ORDER — AZITHROMYCIN 250 MG PO TABS: ORAL_TABLET | ORAL | 0 refills | Status: DC

## 2024-01-05 MED ORDER — BENZONATATE 200 MG PO CAPS
200.0000 mg | ORAL_CAPSULE | Freq: Three times a day (TID) | ORAL | 0 refills | Status: DC | PRN
Start: 2024-01-05 — End: 2024-02-02

## 2024-01-05 NOTE — ED Provider Notes (Signed)
 [facility]  Note:  This document was prepared using Dragon voice recognition software and may include unintentional dictation errors.  MRN: [id number] DOB: 1970-12-15 DATE: 01/05/24   Subjective:  Chief Complaint:  Chief Complaint  Patient presents with   Cough     HPI: Roberto Charles. is a 53 y.o. male presenting for exposure to pertussis. Patient states that he went to a work conference in [place]  last week. He states he was at the conference from Monday to Thursday. He returned home and by Sunday he started feeling ill. Reports fevers, cough, congestion, myalgias. He states that by Wednesday symptoms started to improve. He reports continued slight myalgias and slight cough. Max temperature was 101 earlier this week. He reports taking nyquil and dayquil for his symptoms. Reports that he is up to date on his vaccine. Denies vomiting, abdominal pain. Endorses fever, cough, congestion, myalgias. Presents NAD.  Prior to Admission medications   Medication Sig Start Date End Date Taking? Authorizing Provider  azithromycin  (ZITHROMAX  Z-PAK) 250 MG tablet Take 500mg  (2 tablets) once on day 1, then 250mg  (1 tablet) every 24 hours x 4 days 01/05/24  Yes Lemmie Vanlanen P, PA-C  benzonatate (TESSALON) 200 MG capsule Take 1 capsule (200 mg total) by mouth 3 (three) times daily as needed for cough. 01/05/24  Yes Bryli Mantey P, PA-C  cetirizine (ZYRTEC) 10 MG tablet Take 10 mg by mouth daily.    [provider]  triamcinolone  (NASACORT ) 55 MCG/ACT AERO nasal inhaler Place 2 sprays into the nose daily.    [provider]     Allergies  Allergen Reactions   Amoxicillin Other (See Comments)    Causes blood in intestines- Had once incidence of bloody diarrhea but thinks he has tolerated since this incidence     History:   Past Medical History:  Diagnosis Date   Allergy    Anxiety    GERD (gastroesophageal reflux disease)    Headache(784.0)    History of kidney  stones    Obesity    Sleep apnea    on BPAP since June     Past Surgical History:  Procedure Laterality Date   CHOLECYSTECTOMY     FLEXIBLE SIGMOIDOSCOPY N/A 12/23/2021   Procedure: FLEXIBLE SIGMOIDOSCOPY;  Surgeon: Melvenia Stabs, MD;  Location: [facility];  Service: General;  Laterality: N/A;   MANDIBLE SURGERY  1995   right hand surgery      VASECTOMY  2010   XI ROBOTIC ASSISTED LOWER ANTERIOR RESECTION N/A 12/23/2021   Procedure: XI ROBOTIC ASSISTED LOWER ANTERIOR RESECTION, BILATERAL TAP BLOCK;  Surgeon: Melvenia Stabs, MD;  Location: [facility];  Service: General;  Laterality: N/A;    Family History  Problem Relation Age of Onset   Hypertension Father    Colon polyps Father    Uterine cancer Maternal Aunt    Testicular cancer Paternal Uncle    Uterine cancer Maternal Grandmother    Colon cancer Neg Hx    Pancreatic cancer Neg Hx    Stomach cancer Neg Hx    Liver cancer Neg Hx    Esophageal cancer Neg Hx    Rectal cancer Neg Hx     Social History   Tobacco Use   Smoking status: Never   Smokeless tobacco: Never  Vaping Use   Vaping status: Never Used  Substance Use Topics   Alcohol use: Yes    Alcohol/week: 2.0 standard drinks of alcohol    Types: 2 Standard drinks  or equivalent per week    Comment: social   Drug use: No    Review of Systems  Constitutional:  Positive for chills, fatigue and fever.  HENT:  Positive for congestion and rhinorrhea. Negative for sore throat.   Respiratory:  Positive for cough.   Gastrointestinal:  Positive for nausea. Negative for abdominal pain and vomiting.  Musculoskeletal:  Positive for arthralgias and myalgias.     Objective:   Vitals: BP 111/74 (BP Location: Right Arm)   Pulse 76   Temp 98.3 F (36.8 C) (Oral)   Resp 16   SpO2 94%   Physical Exam Constitutional:      General: He is not in acute distress.    Appearance: Normal appearance. He is well-developed. He is obese. He is not ill-appearing or  toxic-appearing.  HENT:     Head: Normocephalic and atraumatic.  Cardiovascular:     Rate and Rhythm: Normal rate and regular rhythm.     Heart sounds: Normal heart sounds.  Pulmonary:     Effort: Pulmonary effort is normal.     Breath sounds: Normal breath sounds.     Comments: Clear to auscultation bilaterally  Abdominal:     General: Bowel sounds are normal.     Palpations: Abdomen is soft.     Tenderness: There is no abdominal tenderness.  Skin:    General: Skin is warm and dry.  Neurological:     General: No focal deficit present.     Mental Status: He is alert.  Psychiatric:        Mood and Affect: Mood and affect normal.     Results:  Labs: No results found for this or any previous visit (from the past 24 hours).  Radiology: No results found.   UC Course/Treatments:  Procedures: Procedures   Medications Ordered in UC: Medications - No data to display   Assessment and Plan :     ICD-10-CM   1. Exposure to pertussis  Z20.818 Resp panel by RT-PCR (RSV, Flu A&B, Covid) Anterior Nasal Swab    Resp panel by RT-PCR (RSV, Flu A&B, Covid) Anterior Nasal Swab    2. Acute cough  R05.1 Resp panel by RT-PCR (RSV, Flu A&B, Covid) Anterior Nasal Swab    Resp panel by RT-PCR (RSV, Flu A&B, Covid) Anterior Nasal Swab    3. Fever, unspecified  R50.9 Resp panel by RT-PCR (RSV, Flu A&B, Covid) Anterior Nasal Swab    Resp panel by RT-PCR (RSV, Flu A&B, Covid) Anterior Nasal Swab     Exposure to pertussis Afebrile, nontoxic-appearing, NAD. VSS. Patient agreeable to respiratory panel. Up to date on vaccines. Zithromax  250mg  as directed was prescribed given exposure as well as co-morbidies. Recommend he quaratine until ABX is completed. Benzonatate 200mg  TID PRN was prescribed for cough. Strict ED precautions were given and patient verbalized understanding.  Acute cough Afebrile, nontoxic-appearing, NAD. VSS. DDX includes but not limited to: COVID, flu, bronchitis, pneumonia,  viral URI Patient agreeable to respiratory panel. Up to date on vaccines. Zithromax  250mg  as directed was prescribed given exposure to pertussis as well as co-morbidies. Recommend he quaratine until ABX is completed. Benzonatate 200mg  TID PRN was prescribed for cough. Strict ED precautions were given and patient verbalized understanding.  Fever, unspecified Afebrile, nontoxic-appearing, NAD. VSS. DDX includes but not limited to: COVID, flu, bronchitis, pneumonia, viral URI Resolved. Patient agreeable to respiratory panel. Up to date on vaccines. Zithromax  250mg  as directed was prescribed given exposure to pertussis as well as  co-morbidies. Recommend he quaratine until ABX is completed. Benzonatate 200mg  TID PRN was prescribed for cough. Strict ED precautions were given and patient verbalized understanding.   ED Discharge Orders          Ordered    azithromycin  (ZITHROMAX  Z-PAK) 250 MG tablet        01/05/24 1335    benzonatate (TESSALON) 200 MG capsule  3 times daily PRN        01/05/24 1335             PDMP not reviewed this encounter.     Avi Body, PA-C 01/05/24 1343

## 2024-01-05 NOTE — Telephone Encounter (Signed)
 Spoke w/pt he stated that he was advised to go to [facility] for testing.

## 2024-01-05 NOTE — Discharge Instructions (Addendum)
 Your swab has been sent to the lab. Someone from our office will call you with your results. A prescription was sent for Zithromax . This is an antibiotic used to treat Pertussis given your exposure. Take as directed.   I recommend you quarantine while taking the antibiotic.  Return in 3-4 days if no improvement. It is very important for you to pay attention to any new symptoms or worsening of your current condition. Please go directly to the Emergency Department immediately should you begin to have any of the following symptoms: shortness of breath, chest pain or difficulty breathing.

## 2024-01-05 NOTE — ED Triage Notes (Signed)
 Patient states he was exposed to whooping cough last Monday thru Thursday and then became sick on Sunday. C/O cough, body aches , fever and nasal congestion.  C/O upper back discomfort.

## 2024-01-05 NOTE — Telephone Encounter (Signed)
 Copied From CRM [id number]. Reason for Triage: had a cough and flu like symptoms last week but was recently told by his job that at a business event that they were exposed to a person with a confirmed diagnosis of  whooping cough, should he get tested. Patient is waiting on a phone call on what to do, patients callback number is [phone].  ----- Message from Madelyne Schiff sent at 01/05/2024 11:00 AM EDT ----- Copied From CRM [phone]. Reason for Triage:  had a cough and flu like symptoms last week but was recently told by his job that at a business event that they were exposed to a person with a confirmed diagnosis of  whooping cough,  should he get tested.    Chief Complaint: whooping cough exposure Symptoms: cough Frequency: intermittent Pertinent Negatives: Patient denies shortness of breath Disposition: [] ED /[] Urgent Care (no appt availability in office) / [] Appointment(In office/virtual)/ []  [place] [facility]/ [] Home Care/ [] Refused Recommended Disposition /[] [place] [facility]/ []  Follow-up with PCP Additional Notes: close Whooping cough exposure at work retreat, now experiencing cough and body aches. Pt needs to be seen within 24 hours per protocol, no appts avail at this time. RN advising UC, pt agreeable.  Reason for Disposition  [1] Coughing occurs AND [2] within 21 days of whooping cough EXPOSURE (Close Contact)  Answer Assessment - Initial Assessment Questions 1. PLACE of EXPOSURE: "Where were you when you were exposed to the whooping cough?" (e.g., home, school, work)     Work retreat  2. TYPE of EXPOSURE: "How much contact was there?" (e.g., distance in feet or meters; duration; hugging, sitting together)     Close contact, sitting together  3. DATE of EXPOSURE: "When did the exposure occur?" (e.g., days ago)     Within the last week for approx 4 days  4. SYMPTOMS: "Do you have any symptoms?" (e.g., cough, runny nose) If Yes, ask: "When did the symptoms start?"      Cough and flu light symptoms that started 6 days ago  Protocols used: Whooping Cough Exposure-A-AH

## 2024-01-06 ENCOUNTER — Telehealth: Payer: Self-pay

## 2024-01-06 ENCOUNTER — Telehealth: Payer: Self-pay | Admitting: Internal Medicine

## 2024-01-06 ENCOUNTER — Other Ambulatory Visit (HOSPITAL_COMMUNITY)
Admission: RE | Admit: 2024-01-06 | Discharge: 2024-01-06 | Disposition: A | Discharge status: Home / Self Care | Attending: Family Medicine | Admitting: Family Medicine

## 2024-01-06 DIAGNOSIS — R051 Acute cough: Secondary | ICD-10-CM | POA: Diagnosis not present

## 2024-01-06 DIAGNOSIS — R509 Fever, unspecified: Secondary | ICD-10-CM | POA: Insufficient documentation

## 2024-01-06 DIAGNOSIS — Z20818 Contact with and (suspected) exposure to other bacterial communicable diseases: Secondary | ICD-10-CM | POA: Diagnosis not present

## 2024-01-06 LAB — RESP PANEL BY RT-PCR (RSV, FLU A&B, COVID)  RVPGX2
Influenza A by PCR: NEGATIVE
Influenza B by PCR: NEGATIVE
Resp Syncytial Virus by PCR: NEGATIVE
SARS Coronavirus 2 by RT PCR: NEGATIVE

## 2024-01-06 NOTE — Telephone Encounter (Signed)
 Order was placed for complete 20 pathogen respiratory panel. Patient can return to the office to have his swab recollected.

## 2024-01-06 NOTE — Telephone Encounter (Signed)
 The provider informed me that the wrong test was ordered yesterday for this patient therefore I called to inform him of this. The patient was called and his identity was verified with three patient identifiers. The patient was informed that the wrong test was ordered and that he could come in today and get a new test done today. The patient verbalized understanding and started he could come in today.

## 2024-01-08 LAB — RESPIRATORY PANEL BY PCR

## 2024-01-10 DIAGNOSIS — G4733 Obstructive sleep apnea (adult) (pediatric): Secondary | ICD-10-CM | POA: Diagnosis not present

## 2024-02-02 ENCOUNTER — Ambulatory Visit (INDEPENDENT_AMBULATORY_CARE_PROVIDER_SITE_OTHER)

## 2024-02-02 ENCOUNTER — Ambulatory Visit
Admission: EM | Admit: 2024-02-02 | Discharge: 2024-02-02 | Disposition: A | Discharge status: Home / Self Care | Attending: Internal Medicine | Admitting: Internal Medicine

## 2024-02-02 ENCOUNTER — Other Ambulatory Visit: Payer: Self-pay

## 2024-02-02 ENCOUNTER — Ambulatory Visit: Payer: Self-pay

## 2024-02-02 DIAGNOSIS — R3916 Straining to void: Secondary | ICD-10-CM | POA: Diagnosis not present

## 2024-02-02 DIAGNOSIS — R300 Dysuria: Secondary | ICD-10-CM | POA: Diagnosis not present

## 2024-02-02 DIAGNOSIS — R3129 Other microscopic hematuria: Secondary | ICD-10-CM

## 2024-02-02 DIAGNOSIS — N401 Enlarged prostate with lower urinary tract symptoms: Secondary | ICD-10-CM | POA: Diagnosis not present

## 2024-02-02 DIAGNOSIS — R109 Unspecified abdominal pain: Secondary | ICD-10-CM | POA: Insufficient documentation

## 2024-02-02 LAB — POCT URINALYSIS DIP (MANUAL ENTRY)
Bilirubin, UA: NEGATIVE
Glucose, UA: NEGATIVE mg/dL
Ketones, POC UA: NEGATIVE mg/dL
Leukocytes, UA: NEGATIVE
Nitrite, UA: NEGATIVE
Protein Ur, POC: NEGATIVE mg/dL
Spec Grav, UA: >=1.03 — AB
Urobilinogen, UA: 0.2 U/dL
pH, UA: 6

## 2024-02-02 MED ORDER — TAMSULOSIN HCL 0.4 MG PO CAPS: 0.4000 mg | ORAL_CAPSULE | Freq: Every day | ORAL | 1 refills | Status: DC

## 2024-02-02 MED ORDER — CIPROFLOXACIN HCL 500 MG PO TABS
500.0000 mg | ORAL_TABLET | Freq: Two times a day (BID) | ORAL | 0 refills | Status: AC
Start: 2024-02-02 — End: 2024-02-09

## 2024-02-02 NOTE — Telephone Encounter (Signed)
 Chief Complaint:  - Difficulty initiating urinary stream   Symptoms:  -Some urgency -Difficulty initiating urinary stream  -some low back discomfort.   Onset: ---2 weeks    Patient denies  - Fever, pain, headache, weakness, s/s of dehydration.    Disposition: [ ] ED /[ X]Urgent Care (no appt availability in office) / [ ] Appointment(In office/virtual)/ [ ]  [place] [facility]/ [ ] Home Care/ [ ] Refused Recommended Disposition /[ ] [place] [facility]/ [ ]  Follow-up with PCP   Additional Notes:  No appt noted during dispo timeframe.  Patient educated on pertinent s/s that would warrant emergent help/call 911/ ED/ Patient verbalized understanding and agrees with plan No additional questions/concerns noted during the time of the call.     Complete triage note below:       Copied from CRM [id number]. Topic: Clinical - Red Word Triage >> Feb 02, 2024  9:44 AM Kevelyn M wrote: Red Word that prompted transfer to Nurse Triage: Several weeks ago had kidney pain and called us  but ended up taking pain meds. A week ago had discomfort with urination, had to go repeatedly to the rest room, but bladder isn't full, pain with urination, pain in shaft of penis, feels like something is stuck in [place]. He thinks he has kidney stones, he's had them before. Additional Information  Commented on: All Negative - See HCP Within 4 Hours (Or PCP Triage)    Nursing judgement- pt difficulty initiating urine stream + dribbling when trying to urinate. But  bladder does not feel completely full to pt.  Answer Assessment - Initial Assessment Questions 1. SYMPTOM: "What's the main symptom you're concerned about?" (e.g., frequency, incontinence)     ---Dribbling... difficulty intiating urinary stream   2. ONSET: "When did the  start?"     2 weeks ago- Intermittent    3. PAIN: "Is there any pain?" If Yes, ask: "How bad is it?" (Scale: 1-10; mild, moderate, severe)    ---- Discomfort 5/10, last night was  10/10   4. CAUSE: "What do you think is causing the symptoms?"     --- Unsure. Thinks it might also be an STI. Unprotected sex 2wks ago.   5. OTHER SYMPTOMS: "Do you have any other symptoms?" (e.g., blood in urine, fever, flank pain, pain with urination)     --- "Feels like something is stuck in my urethra" ------- Does have a hx of kidney stones  ------ Radiating tingling sensation on the head of penis ------ Difficulty initiating urinary stream -------- Last time using bathroom, and fully empty : earlier this week  Protocols used: Urinary Symptoms-A-AH

## 2024-02-02 NOTE — Discharge Instructions (Signed)
 Your urine sample was consistent with a possible urinary tract infection. You were prescribed Cipro  which is an antibiotic that is often used to treat urinary tract infections.  Take the prescription as directed. A urine culture has been sent to the lab for further testing.  We will call you with those results.  If the prescription needs to be changed, it will be done so at that time.   Return in 2 to 3 days if no improvement. Please go directly to the Emergency Department immediately should you begin to have any of the following symptoms: persistent fevers, increased pain or persistent nausea/vomiting.   I have also sent you are prescription for Flomax  to help with your urination as well.   I recommend you follow up with your urologist as well.

## 2024-02-02 NOTE — ED Provider Notes (Signed)
 [facility]  Note:  This document was prepared using Dragon voice recognition software and may include unintentional dictation errors.  MRN: [id number] DOB: [birth year] DATE: 02/02/24   Subjective:   Chief Complaint:  Chief Complaint  Patient presents with   Urinary Frequency     HPI: Roberto Charles. is a 53 y.o. male presenting for right flank pain and dysuria for the past 2-3 weeks. Patient states about 3 weeks ago he started with right flank pain. Improved with OTC medications and resolved after 2 days. He states he does have a history of kidney stones. No hematuria at that time. He became concerned when this week he started with burning with urination as well as difficulty emptying his bladder. He reports a week stream earlier this week that has since improved, but continues to have burning with urination. He has a history of BPH as well as elevated PSA. Prostate biopsy was preformed in the past and negative at that time. Patient is also reports unprotected sex while traveling in the [place] last month. Denies fever, nausea/vomiting, abdominal pain, low back pain, penile discharge. Endorses right flank pain, dysuria, urinary retention, weak stream. Presents NAD.  Prior to Admission medications   Medication Sig Start Date End Date Taking? Authorizing Provider  cetirizine (ZYRTEC) 10 MG tablet Take 10 mg by mouth daily.    [provider]  triamcinolone  (NASACORT ) 55 MCG/ACT AERO nasal inhaler Place 2 sprays into the nose daily.    [provider]     Allergies  Allergen Reactions   Amoxicillin Other (See Comments)    Causes blood in intestines- Had once incidence of bloody diarrhea but thinks he has tolerated since this incidence     History:   Past Medical History:  Diagnosis Date   Allergy    Anxiety    GERD (gastroesophageal reflux disease)    Headache(784.0)    History of kidney stones    Obesity    Sleep apnea    on BPAP since  June     Past Surgical History:  Procedure Laterality Date   CHOLECYSTECTOMY     FLEXIBLE SIGMOIDOSCOPY N/A 12/23/2021   Procedure: FLEXIBLE SIGMOIDOSCOPY;  Surgeon: Melvenia Stabs, MD;  Location: [facility];  Service: General;  Laterality: N/A;   MANDIBLE SURGERY  1995   right hand surgery      VASECTOMY  2010   XI ROBOTIC ASSISTED LOWER ANTERIOR RESECTION N/A 12/23/2021   Procedure: XI ROBOTIC ASSISTED LOWER ANTERIOR RESECTION, BILATERAL TAP BLOCK;  Surgeon: Melvenia Stabs, MD;  Location: [facility];  Service: General;  Laterality: N/A;    Family History  Problem Relation Age of Onset   Hypertension Father    Colon polyps Father    Uterine cancer Maternal Aunt    Testicular cancer Paternal Uncle    Uterine cancer Maternal Grandmother    Colon cancer Neg Hx    Pancreatic cancer Neg Hx    Stomach cancer Neg Hx    Liver cancer Neg Hx    Esophageal cancer Neg Hx    Rectal cancer Neg Hx     Social History   Tobacco Use   Smoking status: Never   Smokeless tobacco: Never  Vaping Use   Vaping status: Never Used  Substance Use Topics   Alcohol use: Yes    Alcohol/week: 2.0 standard drinks of alcohol    Types: 2 Standard drinks or equivalent per week    Comment: social   Drug use:  No    Review of Systems  Constitutional:  Negative for fever.  Gastrointestinal:  Negative for abdominal pain, nausea and vomiting.  Genitourinary:  Positive for decreased urine volume, difficulty urinating, dysuria and flank pain. Negative for hematuria and penile discharge.  Musculoskeletal:  Negative for back pain.     Objective:   Vitals: BP 120/80 (BP Location: Right Arm)   Pulse 67   Temp 98 F (36.7 C) (Oral)   Resp 18   SpO2 95%   Physical Exam Constitutional:      General: He is not in acute distress.    Appearance: Normal appearance. He is well-developed. He is morbidly obese. He is not ill-appearing or toxic-appearing.  HENT:     Head: Normocephalic and atraumatic.   Cardiovascular:     Rate and Rhythm: Normal rate and regular rhythm.     Heart sounds: Normal heart sounds.  Pulmonary:     Effort: Pulmonary effort is normal.     Breath sounds: Normal breath sounds.     Comments: Clear to auscultation bilaterally  Abdominal:     General: Bowel sounds are normal.     Palpations: Abdomen is soft.     Tenderness: There is no abdominal tenderness. There is no right CVA tenderness or left CVA tenderness.  Skin:    General: Skin is warm and dry.  Neurological:     General: No focal deficit present.     Mental Status: He is alert.  Psychiatric:        Mood and Affect: Mood and affect normal.     Results:  Labs: Results for orders placed or performed during the hospital encounter of 02/02/24 (from the past 24 hours)  POCT urinalysis dipstick     Status: Abnormal   Collection Time: 02/02/24 11:01 AM  Result Value Ref Range   Color, UA yellow    Clarity, UA clear    Glucose, UA negative mg/dL   Bilirubin, UA negative    Ketones, POC UA negative mg/dL   Spec Grav, UA >=3.086 (A)    Blood, UA moderate (A)    pH, UA 6.0    Protein Ur, POC negative mg/dL   Urobilinogen, UA 0.2 E.U./dL   Nitrite, UA Negative    Leukocytes, UA Negative     Radiology: No results found.   UC Course/Treatments:  Procedures: Procedures   Medications Ordered in UC: Medications - No data to display   Assessment and Plan :     ICD-10-CM   1. Acute right flank pain  R10.9 DG Abd 1 View    Comprehensive metabolic panel    CBC with Differential/Platelet    DG Abd 1 View    Comprehensive metabolic panel    CBC with Differential/Platelet    2. Dysuria  R30.0 Urine Culture    Cytology Ancillary Only -Urethral; GC / Chlamydia, Trichomonas    Comprehensive metabolic panel    CBC with Differential/Platelet    Urine Culture    Cytology Ancillary Only -Urethral; GC / Chlamydia, Trichomonas    Comprehensive metabolic panel    CBC with Differential/Platelet     3. Other microscopic hematuria  R31.29 Urine Culture    DG Abd 1 View    Comprehensive metabolic panel    CBC with Differential/Platelet    Urine Culture    DG Abd 1 View    Comprehensive metabolic panel    CBC with Differential/Platelet    4. Benign prostatic hyperplasia (BPH) with straining on urination  N40.1    R39.16      Dysuria Afebrile, nontoxic-appearing, NAD. VSS. DDX includes but not limited to: Cystitis, pyelonephritis, nephrolithiasis, STD  UA was positive for moderate amount of blood.  Urine culture is pending.  Will adjust treatment plan based on results.  Cipro  500 mg twice daily was prescribed at this time. Cytology is pending as well given history of unprotected sex.  Low suspicion for pyelonephritis at this time given no CVA tenderness on exam. Strict ED precautions were given and patient verbalized understanding.  Acute right flank pain Afebrile, nontoxic-appearing, NAD. VSS. DDX includes but not limited to: Nephrolithiasis, pyelonephritis, cystitis, malignancy UA was positive for moderate mount of blood.  Urine culture is pending. Will adjust treatment plan based on results.  Per his EMR, last CT was in February 2024 and bilateral kidney stones noted on exam.  Imaging showed a nonspecific bowel gas pattern but no visible stones. CBC and CMP are pending to evaluate WBC and kidney function.  Low suspicion for pyelonephritis at this time given no CVA tenderness on exam.  Cipro  500 mg twice daily was prescribed for suspected cystitis.  Recommend follow-up with urology for repeat CT. Strict ED precautions were given and patient verbalized understanding.  Benign Prostatic Hyperplasia (BPH) with straining on urination Afebrile, nontoxic-appearing, NAD. VSS. BPH previously diagnosed by urology.  History of elevated PSA with negative biopsy.  Suspect symptoms are related to his BPH versus cystitis.  Flomax 0.4mg  every day was prescribed. Strict ED precautions were given and  patient verbalized understanding.   ED Discharge Orders          Ordered    ciprofloxacin  (CIPRO ) 500 MG tablet  Every 12 hours        02/02/24 1147    tamsulosin (FLOMAX) 0.4 MG CAPS capsule  Daily        02/02/24 1147             PDMP not reviewed this encounter.     Avi Body, PA-C 02/02/24 1151

## 2024-02-02 NOTE — ED Triage Notes (Signed)
 Patient states he is having difficulty emptying his bladder. Patient had some right flank pain 2 weeks ago which has resolved but now having trouble emptying his bladder. Denies hematuria. Patient states hx of kidney stone and elevated PSA and biopsy last year.

## 2024-02-03 ENCOUNTER — Ambulatory Visit: Payer: Self-pay | Admitting: Internal Medicine

## 2024-02-03 LAB — CBC WITH DIFFERENTIAL/PLATELET
Basophils Absolute: 0 10*3/uL (ref 0.0–0.2)
Basos: 0 %
EOS (ABSOLUTE): 0.1 10*3/uL (ref 0.0–0.4)
Eos: 2 %
Hematocrit: 44.4 % (ref 37.5–51.0)
Hemoglobin: 14.3 g/dL (ref 13.0–17.7)
Immature Grans (Abs): 0 10*3/uL (ref 0.0–0.1)
Immature Granulocytes: 0 %
Lymphocytes Absolute: 1.9 10*3/uL (ref 0.7–3.1)
Lymphs: 26 %
MCH: 27.7 pg (ref 26.6–33.0)
MCHC: 32.2 g/dL (ref 31.5–35.7)
MCV: 86 fL (ref 79–97)
Monocytes Absolute: 0.6 10*3/uL (ref 0.1–0.9)
Monocytes: 8 %
Neutrophils Absolute: 4.7 10*3/uL (ref 1.4–7.0)
Neutrophils: 64 %
Platelets: 218 10*3/uL (ref 150–450)
RBC: 5.16 x10E6/uL (ref 4.14–5.80)
RDW: 14.5 % (ref 11.6–15.4)
WBC: 7.4 10*3/uL (ref 3.4–10.8)

## 2024-02-03 LAB — COMPREHENSIVE METABOLIC PANEL WITH GFR
ALT: 26 IU/L (ref 0–44)
AST: 17 IU/L (ref 0–40)
Albumin: 4.3 g/dL (ref 3.8–4.9)
Alkaline Phosphatase: 123 IU/L — ABNORMAL HIGH (ref 44–121)
BUN/Creatinine Ratio: 16 (ref 9–20)
BUN: 11 mg/dL (ref 6–24)
Bilirubin Total: 0.3 mg/dL (ref 0.0–1.2)
CO2: 19 mmol/L — ABNORMAL LOW (ref 20–29)
Calcium: 8.6 mg/dL — ABNORMAL LOW (ref 8.7–10.2)
Chloride: 107 mmol/L — ABNORMAL HIGH (ref 96–106)
Creatinine, Ser: 0.69 mg/dL — ABNORMAL LOW (ref 0.76–1.27)
Globulin, Total: 2.3 g/dL (ref 1.5–4.5)
Glucose: 124 mg/dL — ABNORMAL HIGH (ref 70–99)
Potassium: 4.3 mmol/L (ref 3.5–5.2)
Sodium: 141 mmol/L (ref 134–144)
Total Protein: 6.6 g/dL (ref 6.0–8.5)
eGFR: 111 mL/min/{1.73_m2} (ref >59)

## 2024-02-04 LAB — URINE CULTURE: Culture: <10000 — AB

## 2024-02-05 LAB — CYTOLOGY, (ORAL, ANAL, URETHRAL) ANCILLARY ONLY
Chlamydia: NEGATIVE
Comment: NEGATIVE
Comment: NEGATIVE
Comment: NORMAL
Neisseria Gonorrhea: NEGATIVE
Trichomonas: NEGATIVE

## 2024-02-10 DIAGNOSIS — G4733 Obstructive sleep apnea (adult) (pediatric): Secondary | ICD-10-CM | POA: Diagnosis not present

## 2024-03-11 DIAGNOSIS — G4733 Obstructive sleep apnea (adult) (pediatric): Secondary | ICD-10-CM | POA: Diagnosis not present

## 2024-04-01 ENCOUNTER — Ambulatory Visit: Payer: BC Managed Care – PPO | Admitting: Family Medicine

## 2024-04-11 DIAGNOSIS — G4733 Obstructive sleep apnea (adult) (pediatric): Secondary | ICD-10-CM | POA: Diagnosis not present

## 2024-05-01 ENCOUNTER — Ambulatory Visit
Admission: EM | Admit: 2024-05-01 | Discharge: 2024-05-01 | Disposition: A | Discharge status: Home / Self Care | Attending: Emergency Medicine | Admitting: Emergency Medicine

## 2024-05-01 ENCOUNTER — Other Ambulatory Visit: Payer: Self-pay

## 2024-05-01 DIAGNOSIS — J029 Acute pharyngitis, unspecified: Secondary | ICD-10-CM

## 2024-05-01 DIAGNOSIS — R6889 Other general symptoms and signs: Secondary | ICD-10-CM | POA: Diagnosis not present

## 2024-05-01 DIAGNOSIS — U071 COVID-19: Secondary | ICD-10-CM | POA: Diagnosis not present

## 2024-05-01 LAB — POC COVID19/FLU A&B COMBO
Covid Antigen, POC: POSITIVE — AB
Influenza A Antigen, POC: NEGATIVE
Influenza B Antigen, POC: NEGATIVE

## 2024-05-01 MED ORDER — DEXAMETHASONE 6 MG PO TABS: 6.0000 mg | ORAL_TABLET | Freq: Two times a day (BID) | ORAL | 0 refills | Status: AC

## 2024-05-01 MED ORDER — PROMETHAZINE-DM 6.25-15 MG/5ML PO SYRP: 5.0000 mL | ORAL_SOLUTION | Freq: Every evening | ORAL | 0 refills | Status: DC | PRN

## 2024-05-01 MED ORDER — GUAIFENESIN 400 MG PO TABS: ORAL_TABLET | ORAL | 0 refills | Status: DC

## 2024-05-01 NOTE — ED Triage Notes (Signed)
 C/O nonproductive cough, sore throat and shortness of breath. Onset yesterday. Patient tool halls cough drops for cough.

## 2024-05-01 NOTE — Discharge Instructions (Addendum)
 What is COVID-19?   ??V?D-19 stands for coronavirus disease 2019. It is caused by a virus called ??RS-?oV-2.   C?V?D-19 mainly spreads from person to person usually when an infected person coughs, sneezes, or talks near other people.  A person can become infected and spread the virus to others, even without having any symptoms.  This one of the reasons COVID-19 spreads so quickly.   Can COVID-19 be prevented?   Yes!  The best way to prevent ??V?D-19 is to get vaccinated. In the [place] , people age 51 months and older can get a vaccine.  In addition to vaccination, there are other things you can do to help protect yourself and others. You can:  ?Wash your hands often. ?Consider wearing a face mask in some situations. Masks can help protect both the wearer and others around them. ?Stay home when you are sick. Try to avoid close contact with other people. ?Cover your mouth and nose with the inside of your elbow when you cough or sneeze. ?If someone in your home is sick, regularly clean things that are touched a lot. This includes counters, bedside tables, doorknobs, computers, phones, and bathroom surfaces. ?Make sure that there is good ventilation (air flow) in your home. When possible, open windows to let fresh air in.  Experts recommend layering these strategies. This means doing more than 1 of the things above to protect yourself, especially at times when lots of people are sick.  What are the symptoms of COVID-19?   Symptoms usually start 3 to 5 days after a person is infected with the virus but for some people, it can take up to 2 weeks for symptoms to appear. Many infected people only have mild cold symptoms. Some people never show symptoms at all.  When symptoms do happen, they can include:  ? Fever ? Cough ? Trouble breathing ? Feeling tired ? Shaking chills ? Muscle aches ? Headache ? Sore throat ? Runny or stuffy nose ? Problems with sense of smell or taste ? Diarrhea  and vomiting ? Rash or other skin problems  For most people, symptoms get better within a few days to weeks. But a small number of people get very sick and stop being able to breathe on their own. In severe cases, their organs stop working, which can lead to death.  Some people with C?VID-19 continue to have some symptoms for weeks or months. This seems to be more likely in people who are sick enough to need to stay in the hospital. But this can also happen in people who did not get very sick.  What should I do if I get COVID-19?   Stay home, rest, and drink plenty of fluids. You can also take acetaminophen  (sample brand name: Tylenol ) to relieve fever and aches. If this does not help, you can try medicines like ibuprofen  (sample brand names: Advil , Motrin ).  If you go to a walk-in clinic or a hospital because of your symptoms, tell someone right away why you are there. The staff might ask you to wear a mask or to wait someplace where you are less likely to spread your infection.  Whether or not you need to see a medical provider, stay home while you are sick with C?V?D-19. Do not go to work or school until your fever has been gone for at least 24 hours without taking medicine such as acetaminophen .  If your breathing symptoms get worse, call your doctor or nurse for advice. If you  think that you are having a medical emergency, call 911 for an ambulance  If I have COVID-19, do I need special treatment?   It depends on your age, health, and symptoms. Most people with mild C?VID-19 can rest at home until they get better. Mild means that you might have symptoms like fever, cough, or other cold symptoms, but you do not have trouble breathing. It often takes about 2 weeks for symptoms to improve, but it's not the same for everyone.  Doctors do recommend treatment for people who are at risk for getting seriously ill, even if their symptoms are mild. This includes:  ?Adults 65 years or  older ?Adults who have certain health conditions - Examples include a weaker than normal immune system, diabetes, serious heart or lung disease, chronic kidney disease, and obesity. ?Adults 50 years or older who have not been vaccinated  If you are not sure if you fit into any of these categories, ask your doctor or nurse about treatment. They can talk to you about the risks and benefits.  Please read below to learn more about the medications, dosages and frequencies that I recommend to help alleviate your symptoms and to get you feeling better soon:   Decadron  (dexamethasone ):  To quickly address your significant respiratory inflammation caused by COVID-19, please take 1 tablet twice daily for the next 5 days.   Robitussin, Mucinex  (guaifenesin ): This is a daytime expectorant.  This single symptom reliever helps break up chest congestion and loosen up thick nasal drainage making phlegm and drainage easier to cough up and to blow out from your nose.  I recommend taking 400 mg in either liquid or tablet form three times daily as needed.  I do not recommend the 12-hour extended relief version or doses higher than 400 mg per each dose as these often make some patients feel jittery or jumpy and can interfere with sleep.  I also do not recommend that you purchase guaifenesin  with the ingredient  DM which is dextromethorphan, a cough suppressant which I only recommend taking at bedtime.  Guaifenesin  400 mg is a safe dose for people who are being treated for high blood pressure.     Promethazine  DM: Promethazine  is both a nasal decongestant that dries up mucous membranes and an antinausea medication.  Promethazine  often makes most patients feel fairly sleepy.  DM is dextromethorphan, a single symptom reliever which is a cough suppressant found in many over-the-counter cough medications and combination cold preparations.  Please take 5 mL before bedtime to minimize your cough which will help you sleep  better.  I have sent a prescription for this medication to your pharmacy because it cannot be purchased over-the-counter.  Am I at risk for getting seriously ill?   It depends on your age, your health, and whether you have been vaccinated. In some people, ??V?D-19 leads to serious problems like ???um?ni?, which can cause a person to not get enough oxygen. It can also lead to heart problems, or even death. This risk gets higher as people get older. It is also higher in people who have other health problems like serious heart disease, chronic kidney disease, type 2 diabetes, chronic obstructive pulmonary disease (COPD), sickle cell disease, or obesity. People who have a weak immune system for other reasons (for example, HIV infection or certain medicines), asthma, cystic fibrosis, type 1 diabetes, or high blood pressure might also be at higher risk for serious problems.  Getting vaccinated makes people much less likely to  get seriously ill with ??V?D-19.  When should I seek medical attention?   Call your doctor if:  ?You develop new shortness of breath, or your breathing gets worse (but you can still talk in full sentences). ?You become weak or dizzy. ?You have very dark urine or do not urinate for more than 8 hours. ?You have new or worsening symptoms that concern you - ??VID-19 symptoms can include fever, cough, feeling very tired, shaking, chills, headache, and trouble swallowing. They can also include digestive problems like vomiting or diarrhea.  Call 911 for an ambulance if:  ?You are having so much trouble breathing that you cannot speak a full sentence. ?You are very confused or cannot stay awake. ?Your lips or skin start to turn blue. ?You think that you might be having a medical emergency - Examples include severe chest pain, feeling extremely weak or like you might pass out, or losing control of your body (like being unable to speak normally or move your arm or leg).  Where can I  go to learn more?   You can find more information about ?OVID-19 at the following websites: ?[place]  Centers for Disease Control and Prevention (CDC): IndexCrawler.co.za ?World Health Organization [facility]): [employer].es

## 2024-05-01 NOTE — ED Provider Notes (Signed)
 Roberto Charles    CSN: [id number] Arrival date & time: 05/01/24  1437    HISTORY   Chief Complaint  Patient presents with   Cough   HPI Irving V Pranit Owensby. is a pleasant, 53 y.o. male who presents to urgent care today. Patient complains of acute onset of nonproductive cough, sore throat and shortness of breath yesterday.  Patient states has been taking Hall's cough drops without meaningful relief.  Patient denies known sick contacts, states he works from home.  Patient denies fever, body aches, chills, nausea, vomiting, diarrhea, loss of taste or smell.  Endorses nasal congestion and clear rhinorrhea as well.  The history is provided by the patient.  Cough  Past Medical History:  Diagnosis Date   Allergy    Anxiety    GERD (gastroesophageal reflux disease)    Headache(784.0)    History of kidney stones    Obesity    Sleep apnea    on BPAP since June   Patient Active Problem List   Diagnosis Date Noted   Screening examination for STI 10/03/2023   Left knee pain 05/22/2023   BPH without urinary obstruction 11/24/2022   Routine screening for STI (sexually transmitted infection) 11/09/2022   Elevated PSA 10/12/2022   Nephrolithiasis 10/12/2022   S/P laparoscopic-assisted sigmoidectomy 12/23/2021   Obstructive sleep apnea 03/30/2021   Diverticulitis s/p robotic rectosigmoid resection 12/23/2021 03/14/2021   Type 2 diabetes mellitus with obesity (HCC) 11/30/2020   Hematuria 06/09/2020   Well adult exam 04/30/2020   Mixed hyperlipidemia 12/25/2016   Perennial allergic rhinitis with seasonal variation 12/25/2016   Morbid obesity with BMI of 45.0-49.9, adult (HCC) 09/14/2016   Snores 09/14/2016   Past Surgical History:  Procedure Laterality Date   CHOLECYSTECTOMY     FLEXIBLE SIGMOIDOSCOPY N/A 12/23/2021   Procedure: FLEXIBLE SIGMOIDOSCOPY;  Surgeon: Teresa Lonni HERO, MD;  Location: [facility];  Service: General;  Laterality: N/A;   MANDIBLE SURGERY  1995   right  hand surgery      VASECTOMY  2010   XI ROBOTIC ASSISTED LOWER ANTERIOR RESECTION N/A 12/23/2021   Procedure: XI ROBOTIC ASSISTED LOWER ANTERIOR RESECTION, BILATERAL TAP BLOCK;  Surgeon: Teresa Lonni HERO, MD;  Location: [facility];  Service: General;  Laterality: N/A;    Home Medications    Prior to Admission medications   Medication Sig Start Date End Date Taking? Authorizing Provider  cetirizine (ZYRTEC) 10 MG tablet Take 10 mg by mouth daily.    [provider]  tamsulosin  (FLOMAX ) 0.4 MG CAPS capsule Take 1 capsule (0.4 mg total) by mouth daily. 02/02/24   Hermanns, Ashlee P, PA-C  triamcinolone  (NASACORT ) 55 MCG/ACT AERO nasal inhaler Place 2 sprays into the nose daily.    [provider]    Family History Family History  Problem Relation Age of Onset   Hypertension Father    Colon polyps Father    Uterine cancer Maternal Aunt    Testicular cancer Paternal Uncle    Uterine cancer Maternal Grandmother    Colon cancer Neg Hx    Pancreatic cancer Neg Hx    Stomach cancer Neg Hx    Liver cancer Neg Hx    Esophageal cancer Neg Hx    Rectal cancer Neg Hx    Social History Social History   Tobacco Use   Smoking status: Never   Smokeless tobacco: Never  Vaping Use   Vaping status: Never Used  Substance Use Topics   Alcohol use: Yes  Alcohol/week: 2.0 standard drinks of alcohol    Types: 2 Standard drinks or equivalent per week    Comment: social   Drug use: No   Allergies   Amoxicillin  Review of Systems Review of Systems  Respiratory:  Positive for cough.    Pertinent findings revealed after performing a 14 point review of systems has been noted in the history of present illness.  Physical Exam Vital Signs BP 108/71 (BP Location: Right Arm)   Pulse 64   Temp 98.7 F (37.1 C) (Oral)   Resp 18   SpO2 96%   No data found.  Physical Exam Vitals and nursing note reviewed.  Constitutional:      General: He is awake. He is not in acute  distress.    Appearance: Normal appearance. He is well-developed and well-groomed. He is not ill-appearing.  HENT:     Head: Normocephalic and atraumatic.     Salivary Glands: Right salivary gland is not diffusely enlarged or tender. Left salivary gland is not diffusely enlarged or tender.     Right Ear: Hearing, tympanic membrane, ear canal and external ear normal.     Left Ear: Hearing, tympanic membrane, ear canal and external ear normal.     Nose: Nose normal.     Right Turbinates: Not enlarged, swollen or pale.     Left Turbinates: Not enlarged, swollen or pale.     Right Sinus: No maxillary sinus tenderness or frontal sinus tenderness.     Left Sinus: No maxillary sinus tenderness or frontal sinus tenderness.     Mouth/Throat:     Lips: Pink. No lesions.     Mouth: Mucous membranes are moist. No oral lesions.     Tongue: No lesions. Tongue does not deviate from midline.     Palate: No mass and lesions.     Pharynx: Oropharynx is clear. Uvula midline. No pharyngeal swelling, oropharyngeal exudate, posterior oropharyngeal erythema, uvula swelling or postnasal drip.     Tonsils: No tonsillar exudate. 0 on the right. 0 on the left.  Eyes:     General: Lids are normal.        Right eye: No discharge.        Left eye: No discharge.     Conjunctiva/sclera: Conjunctivae normal.     Right eye: Right conjunctiva is not injected.     Left eye: Left conjunctiva is not injected.  Neck:     Trachea: Trachea and phonation normal.  Cardiovascular:     Rate and Rhythm: Normal rate and regular rhythm.  Pulmonary:     Effort: Pulmonary effort is normal.     Breath sounds: Normal breath sounds.  Chest:     Chest wall: No tenderness.  Musculoskeletal:        General: Normal range of motion.     Cervical back: Full passive range of motion without pain, normal range of motion and neck supple. Normal range of motion.  Lymphadenopathy:     Cervical: No cervical adenopathy.  Skin:    General:  Skin is warm and dry.     Findings: No erythema or rash.  Neurological:     General: No focal deficit present.     Mental Status: He is alert and oriented to person, place, and time. Mental status is at baseline.  Psychiatric:        Attention and Perception: Attention and perception normal.        Mood and Affect: Mood and affect normal.  Speech: Speech normal.        Behavior: Behavior normal. Behavior is cooperative.        Thought Content: Thought content normal.     Visual Acuity Right Eye Distance:   Left Eye Distance:   Bilateral Distance:    Right Eye Near:   Left Eye Near:    Bilateral Near:     Charles Couse / Diagnostics / Procedures:     Radiology No results found.  Procedures Procedures (including critical care time) EKG  Pending results:  Labs Reviewed  POC COVID19/FLU A&B COMBO - Abnormal; Notable for the following components:      Result Value   Covid Antigen, POC Positive (*)    All other components within normal limits  POCT RAPID STREP A (OFFICE)    Medications Ordered in Charles: Medications - No data to display  Charles Diagnoses / Final Clinical Impressions(s)   I have reviewed the triage vital signs and the nursing notes.  Pertinent labs & imaging results that were available during my care of the patient were reviewed by me and considered in my medical decision making (see chart for details).    Final diagnoses:  Feeling unwell  COVID-19  Acute pharyngitis, unspecified etiology   Patient advised rapid strep test that is negative, will perform throat culture per protocol to confirm.  Patient advised of positive COVID-19 test.  Patient provided with instructions for self-care at home.  Recommend patient begin dexamethasone  to treat inflammation caused by COVID-19 infection.  Patient provided with cough medications as well.  Conservative care recommended.  Return precautions advised.  Please see discharge instructions below for details of plan of  care as provided to patient. ED Prescriptions     Medication Sig Dispense Auth. Provider   dexamethasone  (DECADRON ) 6 MG tablet Take 1 tablet (6 mg total) by mouth 2 (two) times daily with a meal for 5 days. 10 tablet Joesph Shaver Scales, PA-C   guaifenesin  (HUMIBID E) 400 MG TABS tablet Take 1 tablet 3 times daily as needed for chest congestion and cough 30 tablet Joesph Shaver Scales, PA-C   promethazine -dextromethorphan (PROMETHAZINE -DM) 6.25-15 MG/5ML syrup Take 5 mLs by mouth at bedtime as needed for cough. 60 mL Joesph Shaver Scales, PA-C      PDMP not reviewed this encounter.  Pending results:  Labs Reviewed  POC COVID19/FLU A&B COMBO - Abnormal; Notable for the following components:      Result Value   Covid Antigen, POC Positive (*)    All other components within normal limits  POCT RAPID STREP A (OFFICE)      Discharge Instructions        What is COVID-19?   ??V?D-19 stands for coronavirus disease 2019. It is caused by a virus called ??RS-?oV-2.   C?V?D-19 mainly spreads from person to person usually when an infected person coughs, sneezes, or talks near other people.  A person can become infected and spread the virus to others, even without having any symptoms.  This one of the reasons COVID-19 spreads so quickly.   Can COVID-19 be prevented?   Yes!  The best way to prevent ??V?D-19 is to get vaccinated. In the [place] , people age 19 months and older can get a vaccine.  In addition to vaccination, there are other things you can do to help protect yourself and others. You can:  ?Wash your hands often. ?Consider wearing a face mask in some situations. Masks can help protect both the wearer and others around  them. ?Stay home when you are sick. Try to avoid close contact with other people. ?Cover your mouth and nose with the inside of your elbow when you cough or sneeze. ?If someone in your home is sick, regularly clean things that are touched a lot. This  includes counters, bedside tables, doorknobs, computers, phones, and bathroom surfaces. ?Make sure that there is good ventilation (air flow) in your home. When possible, open windows to let fresh air in.  Experts recommend layering these strategies. This means doing more than 1 of the things above to protect yourself, especially at times when lots of people are sick.  What are the symptoms of COVID-19?   Symptoms usually start 3 to 5 days after a person is infected with the virus but for some people, it can take up to 2 weeks for symptoms to appear. Many infected people only have mild cold symptoms. Some people never show symptoms at all.  When symptoms do happen, they can include:  ? Fever ? Cough ? Trouble breathing ? Feeling tired ? Shaking chills ? Muscle aches ? Headache ? Sore throat ? Runny or stuffy nose ? Problems with sense of smell or taste ? Diarrhea and vomiting ? Rash or other skin problems  For most people, symptoms get better within a few days to weeks. But a small number of people get very sick and stop being able to breathe on their own. In severe cases, their organs stop working, which can lead to death.  Some people with C?VID-19 continue to have some symptoms for weeks or months. This seems to be more likely in people who are sick enough to need to stay in the hospital. But this can also happen in people who did not get very sick.  What should I do if I get COVID-19?   Stay home, rest, and drink plenty of fluids. You can also take acetaminophen  (sample brand name: Tylenol ) to relieve fever and aches. If this does not help, you can try medicines like ibuprofen  (sample brand names: Advil , Motrin ).  If you go to a walk-in clinic or a hospital because of your symptoms, tell someone right away why you are there. The staff might ask you to wear a mask or to wait someplace where you are less likely to spread your infection.  Whether or not you need to see a medical  provider, stay home while you are sick with C?V?D-19. Do not go to work or school until your fever has been gone for at least 24 hours without taking medicine such as acetaminophen .  If your breathing symptoms get worse, call your doctor or nurse for advice. If you think that you are having a medical emergency, call 911 for an ambulance  If I have COVID-19, do I need special treatment?   It depends on your age, health, and symptoms. Most people with mild C?VID-19 can rest at home until they get better. Mild means that you might have symptoms like fever, cough, or other cold symptoms, but you do not have trouble breathing. It often takes about 2 weeks for symptoms to improve, but it's not the same for everyone.  Doctors do recommend treatment for people who are at risk for getting seriously ill, even if their symptoms are mild. This includes:  ?Adults 65 years or older ?Adults who have certain health conditions - Examples include a weaker than normal immune system, diabetes, serious heart or lung disease, chronic kidney disease, and obesity. ?Adults 50 years or  older who have not been vaccinated  If you are not sure if you fit into any of these categories, ask your doctor or nurse about treatment. They can talk to you about the risks and benefits.  Please read below to learn more about the medications, dosages and frequencies that I recommend to help alleviate your symptoms and to get you feeling better soon:   Decadron  (dexamethasone ):  To quickly address your significant respiratory inflammation caused by COVID-19, please take 1 tablet twice daily for the next 5 days.   Robitussin, Mucinex  (guaifenesin ): This is a daytime expectorant.  This single symptom reliever helps break up chest congestion and loosen up thick nasal drainage making phlegm and drainage easier to cough up and to blow out from your nose.  I recommend taking 400 mg in either liquid or tablet form three times daily as needed.   I do not recommend the 12-hour extended relief version or doses higher than 400 mg per each dose as these often make some patients feel jittery or jumpy and can interfere with sleep.  I also do not recommend that you purchase guaifenesin  with the ingredient  DM which is dextromethorphan, a cough suppressant which I only recommend taking at bedtime.  Guaifenesin  400 mg is a safe dose for people who are being treated for high blood pressure.     Promethazine  DM: Promethazine  is both a nasal decongestant that dries up mucous membranes and an antinausea medication.  Promethazine  often makes most patients feel fairly sleepy.  DM is dextromethorphan, a single symptom reliever which is a cough suppressant found in many over-the-counter cough medications and combination cold preparations.  Please take 5 mL before bedtime to minimize your cough which will help you sleep better.  I have sent a prescription for this medication to your pharmacy because it cannot be purchased over-the-counter.  Am I at risk for getting seriously ill?   It depends on your age, your health, and whether you have been vaccinated. In some people, ??V?D-19 leads to serious problems like ???um?ni?, which can cause a person to not get enough oxygen. It can also lead to heart problems, or even death. This risk gets higher as people get older. It is also higher in people who have other health problems like serious heart disease, chronic kidney disease, type 2 diabetes, chronic obstructive pulmonary disease (COPD), sickle cell disease, or obesity. People who have a weak immune system for other reasons (for example, HIV infection or certain medicines), asthma, cystic fibrosis, type 1 diabetes, or high blood pressure might also be at higher risk for serious problems.  Getting vaccinated makes people much less likely to get seriously ill with ??V?D-19.  When should I seek medical attention?   Call your doctor if:  ?You develop new  shortness of breath, or your breathing gets worse (but you can still talk in full sentences). ?You become weak or dizzy. ?You have very dark urine or do not urinate for more than 8 hours. ?You have new or worsening symptoms that concern you - ??VID-19 symptoms can include fever, cough, feeling very tired, shaking, chills, headache, and trouble swallowing. They can also include digestive problems like vomiting or diarrhea.  Call 911 for an ambulance if:  ?You are having so much trouble breathing that you cannot speak a full sentence. ?You are very confused or cannot stay awake. ?Your lips or skin start to turn blue. ?You think that you might be having a medical emergency - Examples include  severe chest pain, feeling extremely weak or like you might pass out, or losing control of your body (like being unable to speak normally or move your arm or leg).  Where can I go to learn more?   You can find more information about ?OVID-19 at the following websites: ?[place]  Centers for Disease Control and Prevention (CDC): IndexCrawler.co.za ?World Health Organization [facility]): [employer]on.es       Disposition Upon Discharge:  Condition: stable for discharge home  Patient presented with an acute illness with associated systemic symptoms and significant discomfort requiring urgent management. In my opinion, this is a condition that a prudent lay person (someone who possesses an average knowledge of health and medicine) may potentially expect to result in complications if not addressed urgently such as respiratory distress, impairment of bodily function or dysfunction of bodily organs.   Routine symptom specific, illness specific and/or disease specific instructions were discussed with the patient and/or caregiver at length.   As such, the patient has been evaluated and assessed, work-up was performed and treatment was provided in alignment with urgent care  protocols and evidence based medicine.  Patient/parent/caregiver has been advised that the patient may require follow up for further testing and treatment if the symptoms continue in spite of treatment, as clinically indicated and appropriate.  Patient/parent/caregiver has been advised to return to the [facility] or PCP if no better; to PCP or the Emergency Department if new signs and symptoms develop, or if the current signs or symptoms continue to change or worsen for further workup, evaluation and treatment as clinically indicated and appropriate  The patient will follow up with their current PCP if and as advised. If the patient does not currently have a PCP we will assist them in obtaining one.   The patient may need specialty follow up if the symptoms continue, in spite of conservative treatment and management, for further workup, evaluation, consultation and treatment as clinically indicated and appropriate.  Patient/parent/caregiver verbalized understanding and agreement of plan as discussed.  All questions were addressed during visit.  Please see discharge instructions below for further details of plan.  This office note has been dictated using Teaching laboratory technician.  Unfortunately, this method of dictation can sometimes lead to typographical or grammatical errors.  I apologize for your inconvenience in advance if this occurs.  Please do not hesitate to reach out to me if clarification is needed.      Joesph Shaver Scales, PA-C 05/01/24 [phone]

## 2024-05-07 ENCOUNTER — Encounter: Payer: Self-pay | Admitting: Sports Medicine

## 2024-05-12 DIAGNOSIS — G4733 Obstructive sleep apnea (adult) (pediatric): Secondary | ICD-10-CM | POA: Diagnosis not present

## 2024-05-27 DIAGNOSIS — M1712 Unilateral primary osteoarthritis, left knee: Secondary | ICD-10-CM | POA: Diagnosis not present

## 2024-05-27 DIAGNOSIS — S8992XA Unspecified injury of left lower leg, initial encounter: Secondary | ICD-10-CM | POA: Diagnosis not present

## 2024-05-27 DIAGNOSIS — M25562 Pain in left knee: Secondary | ICD-10-CM | POA: Diagnosis not present

## 2024-05-27 DIAGNOSIS — S8392XA Sprain of unspecified site of left knee, initial encounter: Secondary | ICD-10-CM | POA: Diagnosis not present

## 2024-06-11 DIAGNOSIS — G4733 Obstructive sleep apnea (adult) (pediatric): Secondary | ICD-10-CM | POA: Diagnosis not present

## 2024-08-12 DIAGNOSIS — G4733 Obstructive sleep apnea (adult) (pediatric): Secondary | ICD-10-CM | POA: Diagnosis not present

## 2024-08-22 DIAGNOSIS — R21 Rash and other nonspecific skin eruption: Secondary | ICD-10-CM | POA: Diagnosis not present

## 2024-08-22 DIAGNOSIS — J0100 Acute maxillary sinusitis, unspecified: Secondary | ICD-10-CM | POA: Diagnosis not present

## 2024-09-11 ENCOUNTER — Encounter: Payer: Self-pay | Admitting: Urology

## 2024-09-11 ENCOUNTER — Ambulatory Visit: Payer: BC Managed Care – PPO | Admitting: Urology

## 2024-09-11 VITALS — BP 117/66 | HR 67 | Ht 66.0 in | Wt 267.0 lb

## 2024-09-11 DIAGNOSIS — N529 Male erectile dysfunction, unspecified: Secondary | ICD-10-CM | POA: Diagnosis not present

## 2024-09-11 DIAGNOSIS — R972 Elevated prostate specific antigen [PSA]: Secondary | ICD-10-CM

## 2024-09-11 DIAGNOSIS — N400 Enlarged prostate without lower urinary tract symptoms: Secondary | ICD-10-CM | POA: Diagnosis not present

## 2024-09-11 DIAGNOSIS — R310 Gross hematuria: Secondary | ICD-10-CM | POA: Diagnosis not present

## 2024-09-11 DIAGNOSIS — N200 Calculus of kidney: Secondary | ICD-10-CM | POA: Diagnosis not present

## 2024-09-11 LAB — URINALYSIS, ROUTINE W REFLEX MICROSCOPIC
Bilirubin, UA: NEGATIVE
Glucose, UA: NEGATIVE
Ketones, UA: NEGATIVE
Leukocytes,UA: NEGATIVE
Nitrite, UA: NEGATIVE
Protein,UA: NEGATIVE
RBC, UA: NEGATIVE
Specific Gravity, UA: 1.025 (ref 1.005–1.030)
Urobilinogen, Ur: 0.2 mg/dL (ref 0.2–1.0)
pH, UA: 5.5 (ref 5.0–7.5)

## 2024-09-11 LAB — PSA: Prostate Specific Ag, Serum: 1.5 ng/mL (ref 0.0–4.0)

## 2024-09-11 MED ORDER — TADALAFIL 5 MG PO TABS: 5.0000 mg | ORAL_TABLET | Freq: Every day | ORAL | 11 refills | Status: AC | PRN

## 2024-09-11 NOTE — Progress Notes (Signed)
 " Assessment: 1. Elevated PSA; negative biopsy 3/24   2. Gross hematuria: negative evaluation 2/24   3. Nephrolithiasis   4. BPH without urinary obstruction   5. Organic impotence     Plan: PSA today Trial of tadalafil  5 mg daily for BPH with LUTS and ED Return to office in 6 months  Chief Complaint:  Chief Complaint  Patient presents with   Elevated PSA    History of Present Illness:  Roberto Charles. is a 54 y.o. male who is seen for continued evaluation of elevated PSA, history of gross hematuria, BPH with LUTS, nephrolithiasis, and erectile dysfunction.  Elevated PSA: He was found to have an elevated PSA in January 2024.  PSA was 8.5 with 12% free. No prior PSA results available for review.  No history of prostatitis or UTIs.  No family history of prostate cancer. He underwent transrectal ultrasound and biopsy of the prostate on 11/16/22. PSA: 8.5 ng/ml TRUS volume:  80.2 ml  PSA density:  0.11 Biopsy results:  benign prostate tissue with one area of focal chronic prostatitis  PSA results: 6/24 0.8 with 32% free 1/25 1.2  History of Gross Hematuria:  He has a history of intermittent gross hematuria.  He had an episode of gross hematuria in November 2023.  He reported passing blood in the urine at that time.  No flank pain or dysuria.  The hematuria resolved spontaneously.  Dipstick urinalysis from 09/27/2022 was negative.  Urine culture showed no growth.  He reports 2-3 episodes of gross hematuria since that time.  He previously underwent CT imaging in November 2022 which showed a few scattered stones in both kidneys without hydronephrosis, no ureteral calculi, and no suspicious renal masses.  Renal ultrasound from 08/18/2022 showed a 9 mm echogenic focus within the upper pole of the right kidney felt to represent a stone, 2.9 cm hypoechoic structure in the interpolar right kidney felt to represent a cyst, and mild prostatomegaly. He reports occasional bilateral low back  pain. He has a prior history of kidney stones passing approximately 7 stones in total.  He has not required any surgical management for kidney stones.  He last passed a stone in December 2023.  CT hematuria protocol from 10/19/2022 showed small bilateral renal calculi, no renal mass or evidence of obstruction, no ureteral calculi, enlarged prostate with heterogeneous and nodular appearance. Cystoscopy from 10/21/22 showed lateral lobe enlargement of the prostate, elevated bladder neck, and no mucosal lesions in bladder.  Urine cytology was negative for malignancy.  He was seen at urgent care in May 2025 for right flank pain with dysuria.  Dipstick urinalysis showed moderate blood.  Urine ultragrew <10K colonies.   No imaging was done. No further episodes of gross hematuria.    BPH with LUTS:        At his visit in January 2025, his urinary symptoms remained stable.  He had occasional decreased force of stream.  No dysuria or gross hematuria. IPSS = 5.  He has recently noted some urgency with weakened stream and some postvoid dribbling.  No dysuria. IPSS = 4/3.  He also reports problems maintaining his erections.  No pain or curvature with erection.  No decrease in his libido.  He took a medication that he obtained online with some improvement in his erection.   Portions of the above documentation were copied from a prior visit for review purposes only.   Past Medical History:  Past Medical History:  Diagnosis Date  Allergy    Anxiety    GERD (gastroesophageal reflux disease)    Headache(784.0)    History of kidney stones    Obesity    Sleep apnea    on BPAP since June    Past Surgical History:  Past Surgical History:  Procedure Laterality Date   CHOLECYSTECTOMY     FLEXIBLE SIGMOIDOSCOPY N/A 12/23/2021   Procedure: FLEXIBLE SIGMOIDOSCOPY;  Surgeon: Teresa Lonni HERO, MD;  Location: [facility];  Service: General;  Laterality: N/A;   MANDIBLE SURGERY  1995   right hand surgery       VASECTOMY  2010   XI ROBOTIC ASSISTED LOWER ANTERIOR RESECTION N/A 12/23/2021   Procedure: XI ROBOTIC ASSISTED LOWER ANTERIOR RESECTION, BILATERAL TAP BLOCK;  Surgeon: Teresa Lonni HERO, MD;  Location: [facility];  Service: General;  Laterality: N/A;    Allergies:  Allergies  Allergen Reactions   Amoxicillin Other (See Comments)    Causes blood in intestines- Had once incidence of bloody diarrhea but thinks he has tolerated since this incidence     Family History:  Family History  Problem Relation Age of Onset   Hypertension Father    Colon polyps Father    Uterine cancer Maternal Aunt    Testicular cancer Paternal Uncle    Uterine cancer Maternal Grandmother    Colon cancer Neg Hx    Pancreatic cancer Neg Hx    Stomach cancer Neg Hx    Liver cancer Neg Hx    Esophageal cancer Neg Hx    Rectal cancer Neg Hx     Social History:  Social History   Tobacco Use   Smoking status: Never   Smokeless tobacco: Never  Vaping Use   Vaping status: Never Used  Substance Use Topics   Alcohol use: Yes    Alcohol/week: 2.0 standard drinks of alcohol    Types: 2 Standard drinks or equivalent per week    Comment: social   Drug use: No    ROS: Constitutional:  Negative for fever, chills, weight loss CV: Negative for chest pain, previous MI, hypertension Respiratory:  Negative for shortness of breath, wheezing, sleep apnea, frequent cough GI:  Negative for nausea, vomiting, bloody stool, GERD  Physical exam: BP 117/66   Pulse 67   Ht 5' 6 (1.676 m)   Wt 267 lb (121.1 kg)   BMI 43.09 kg/m  GENERAL APPEARANCE:  Well appearing, well developed, well nourished, NAD HEENT:  Atraumatic, normocephalic, oropharynx clear NECK:  Supple without lymphadenopathy or thyromegaly ABDOMEN:  Soft, non-tender, no masses EXTREMITIES:  Moves all extremities well, without clubbing, cyanosis, or edema NEUROLOGIC:  Alert and oriented x 3, normal gait, CN II-XII grossly intact MENTAL STATUS:   appropriate BACK:  Non-tender to palpation, No CVAT SKIN:  Warm, dry, and intact GU: Prostate: 60 g, NT, no nodules Rectum: Normal tone,  no masses or tenderness   Results: U/A: Negative "

## 2024-09-12 ENCOUNTER — Ambulatory Visit: Payer: Self-pay | Admitting: Urology

## 2025-03-12 ENCOUNTER — Ambulatory Visit: Admitting: Urology
